# Patient Record
Sex: Female | Born: 1985 | Race: White | Hispanic: No | Marital: Single | Smoking: Current every day smoker
Health system: Southern US, Community
[De-identification: ages and names within clinical notes are randomized; demographics above are authoritative.]

## PROBLEM LIST (undated history)

## (undated) DIAGNOSIS — I8390 Asymptomatic varicose veins of unspecified lower extremity: Secondary | ICD-10-CM

## (undated) DIAGNOSIS — R87629 Unspecified abnormal cytological findings in specimens from vagina: Secondary | ICD-10-CM

## (undated) DIAGNOSIS — IMO0002 Reserved for concepts with insufficient information to code with codable children: Secondary | ICD-10-CM

## (undated) DIAGNOSIS — G43909 Migraine, unspecified, not intractable, without status migrainosus: Secondary | ICD-10-CM

## (undated) DIAGNOSIS — K219 Gastro-esophageal reflux disease without esophagitis: Secondary | ICD-10-CM

## (undated) DIAGNOSIS — R87619 Unspecified abnormal cytological findings in specimens from cervix uteri: Secondary | ICD-10-CM

## (undated) DIAGNOSIS — G8929 Other chronic pain: Secondary | ICD-10-CM

## (undated) DIAGNOSIS — M549 Dorsalgia, unspecified: Secondary | ICD-10-CM

## (undated) DIAGNOSIS — F32A Depression, unspecified: Secondary | ICD-10-CM

## (undated) DIAGNOSIS — F410 Panic disorder [episodic paroxysmal anxiety] without agoraphobia: Secondary | ICD-10-CM

## (undated) DIAGNOSIS — N289 Disorder of kidney and ureter, unspecified: Secondary | ICD-10-CM

## (undated) DIAGNOSIS — L600 Ingrowing nail: Secondary | ICD-10-CM

## (undated) DIAGNOSIS — F329 Major depressive disorder, single episode, unspecified: Secondary | ICD-10-CM

## (undated) DIAGNOSIS — R110 Nausea: Secondary | ICD-10-CM

## (undated) HISTORY — DX: Other chronic pain: G89.29

## (undated) HISTORY — PX: CHOLECYSTECTOMY: SHX55

## (undated) HISTORY — DX: Gastro-esophageal reflux disease without esophagitis: K21.9

## (undated) HISTORY — DX: Panic disorder (episodic paroxysmal anxiety): F41.0

## (undated) HISTORY — DX: Depression, unspecified: F32.A

## (undated) HISTORY — DX: Reserved for concepts with insufficient information to code with codable children: IMO0002

## (undated) HISTORY — DX: Dorsalgia, unspecified: M54.9

## (undated) HISTORY — PX: TONSILLECTOMY: SUR1361

## (undated) HISTORY — DX: Unspecified abnormal cytological findings in specimens from vagina: R87.629

## (undated) HISTORY — DX: Major depressive disorder, single episode, unspecified: F32.9

## (undated) HISTORY — DX: Asymptomatic varicose veins of unspecified lower extremity: I83.90

## (undated) HISTORY — DX: Ingrowing nail: L60.0

## (undated) HISTORY — DX: Unspecified abnormal cytological findings in specimens from cervix uteri: R87.619

---
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
## 2005-02-25 ENCOUNTER — Emergency Department (HOSPITAL_COMMUNITY): Admission: EM | Admit: 2005-02-25 | Discharge: 2005-02-25 | Payer: Self-pay | Admitting: Emergency Medicine

## 2005-02-28 ENCOUNTER — Emergency Department (HOSPITAL_COMMUNITY): Admission: EM | Admit: 2005-02-28 | Discharge: 2005-02-28 | Payer: Self-pay | Admitting: Emergency Medicine

## 2006-02-03 ENCOUNTER — Inpatient Hospital Stay (HOSPITAL_COMMUNITY): Admission: AD | Admit: 2006-02-03 | Discharge: 2006-02-05 | Payer: Self-pay | Admitting: Obstetrics and Gynecology

## 2006-02-03 ENCOUNTER — Inpatient Hospital Stay (HOSPITAL_COMMUNITY): Admission: AD | Admit: 2006-02-03 | Discharge: 2006-02-03 | Payer: Self-pay | Admitting: Obstetrics and Gynecology

## 2006-02-03 ENCOUNTER — Encounter (INDEPENDENT_AMBULATORY_CARE_PROVIDER_SITE_OTHER): Payer: Self-pay | Admitting: Specialist

## 2006-05-23 ENCOUNTER — Ambulatory Visit: Payer: Self-pay | Admitting: Family Medicine

## 2006-05-23 DIAGNOSIS — E669 Obesity, unspecified: Secondary | ICD-10-CM | POA: Insufficient documentation

## 2006-05-23 DIAGNOSIS — M545 Low back pain, unspecified: Secondary | ICD-10-CM | POA: Insufficient documentation

## 2006-05-23 DIAGNOSIS — B957 Other staphylococcus as the cause of diseases classified elsewhere: Secondary | ICD-10-CM | POA: Insufficient documentation

## 2006-05-23 DIAGNOSIS — F419 Anxiety disorder, unspecified: Secondary | ICD-10-CM | POA: Insufficient documentation

## 2006-05-23 DIAGNOSIS — G43909 Migraine, unspecified, not intractable, without status migrainosus: Secondary | ICD-10-CM | POA: Insufficient documentation

## 2006-05-23 DIAGNOSIS — K219 Gastro-esophageal reflux disease without esophagitis: Secondary | ICD-10-CM | POA: Insufficient documentation

## 2006-05-29 ENCOUNTER — Telehealth (INDEPENDENT_AMBULATORY_CARE_PROVIDER_SITE_OTHER): Payer: Self-pay | Admitting: Family Medicine

## 2006-05-29 ENCOUNTER — Encounter (INDEPENDENT_AMBULATORY_CARE_PROVIDER_SITE_OTHER): Payer: Self-pay | Admitting: Family Medicine

## 2006-05-29 LAB — CONVERTED CEMR LAB: TSH: 0.416 microintl units/mL (ref 0.350–5.50)

## 2006-05-30 ENCOUNTER — Telehealth (INDEPENDENT_AMBULATORY_CARE_PROVIDER_SITE_OTHER): Payer: Self-pay | Admitting: Family Medicine

## 2006-06-04 ENCOUNTER — Ambulatory Visit: Payer: Self-pay | Admitting: Family Medicine

## 2006-06-04 LAB — CONVERTED CEMR LAB
Cholesterol, target level: 200 mg/dL
HDL goal, serum: 40 mg/dL
LDL Goal: 160 mg/dL

## 2006-07-15 ENCOUNTER — Telehealth (INDEPENDENT_AMBULATORY_CARE_PROVIDER_SITE_OTHER): Payer: Self-pay | Admitting: Family Medicine

## 2006-07-24 ENCOUNTER — Ambulatory Visit: Payer: Self-pay | Admitting: Family Medicine

## 2006-07-31 ENCOUNTER — Encounter (INDEPENDENT_AMBULATORY_CARE_PROVIDER_SITE_OTHER): Payer: Self-pay | Admitting: Family Medicine

## 2006-08-05 ENCOUNTER — Encounter (INDEPENDENT_AMBULATORY_CARE_PROVIDER_SITE_OTHER): Payer: Self-pay | Admitting: Family Medicine

## 2006-08-06 ENCOUNTER — Ambulatory Visit: Payer: Self-pay | Admitting: Family Medicine

## 2006-08-06 DIAGNOSIS — R82998 Other abnormal findings in urine: Secondary | ICD-10-CM | POA: Insufficient documentation

## 2006-08-06 LAB — CONVERTED CEMR LAB
Bilirubin Urine: NEGATIVE
Glucose, Urine, Semiquant: NEGATIVE
Ketones, urine, test strip: NEGATIVE
Nitrite: NEGATIVE
Protein, U semiquant: 100
Specific Gravity, Urine: 1.02
Urobilinogen, UA: 0.2
pH: 6.5

## 2006-08-08 ENCOUNTER — Encounter (INDEPENDENT_AMBULATORY_CARE_PROVIDER_SITE_OTHER): Payer: Self-pay | Admitting: Family Medicine

## 2006-08-13 ENCOUNTER — Encounter (INDEPENDENT_AMBULATORY_CARE_PROVIDER_SITE_OTHER): Payer: Self-pay | Admitting: Family Medicine

## 2006-08-13 LAB — CONVERTED CEMR LAB
Rubella: 34.3 intl units/mL — ABNORMAL HIGH
Varicella IgG: 2.65 — ABNORMAL HIGH

## 2006-09-10 ENCOUNTER — Ambulatory Visit: Payer: Self-pay | Admitting: Internal Medicine

## 2006-09-18 ENCOUNTER — Ambulatory Visit: Payer: Self-pay | Admitting: Family Medicine

## 2006-12-08 ENCOUNTER — Emergency Department (HOSPITAL_COMMUNITY): Admission: EM | Admit: 2006-12-08 | Discharge: 2006-12-08 | Payer: Self-pay | Admitting: Emergency Medicine

## 2007-01-30 ENCOUNTER — Emergency Department (HOSPITAL_COMMUNITY): Admission: EM | Admit: 2007-01-30 | Discharge: 2007-01-31 | Payer: Self-pay | Admitting: Emergency Medicine

## 2007-02-06 ENCOUNTER — Ambulatory Visit (HOSPITAL_COMMUNITY): Admission: RE | Admit: 2007-02-06 | Discharge: 2007-02-06 | Payer: Self-pay | Admitting: Orthopaedic Surgery

## 2007-02-15 ENCOUNTER — Emergency Department (HOSPITAL_COMMUNITY): Admission: EM | Admit: 2007-02-15 | Discharge: 2007-02-15 | Payer: Self-pay | Admitting: Emergency Medicine

## 2007-05-07 ENCOUNTER — Emergency Department (HOSPITAL_COMMUNITY): Admission: EM | Admit: 2007-05-07 | Discharge: 2007-05-07 | Payer: Self-pay | Admitting: Emergency Medicine

## 2007-05-09 ENCOUNTER — Emergency Department (HOSPITAL_COMMUNITY): Admission: EM | Admit: 2007-05-09 | Discharge: 2007-05-09 | Payer: Self-pay | Admitting: Emergency Medicine

## 2007-06-04 ENCOUNTER — Emergency Department (HOSPITAL_COMMUNITY): Admission: EM | Admit: 2007-06-04 | Discharge: 2007-06-04 | Payer: Self-pay | Admitting: Emergency Medicine

## 2008-07-03 ENCOUNTER — Emergency Department (HOSPITAL_COMMUNITY): Admission: EM | Admit: 2008-07-03 | Discharge: 2008-07-04 | Payer: Self-pay | Admitting: Emergency Medicine

## 2009-02-10 ENCOUNTER — Emergency Department (HOSPITAL_COMMUNITY): Admission: EM | Admit: 2009-02-10 | Discharge: 2009-02-10 | Payer: Self-pay | Admitting: Emergency Medicine

## 2010-01-31 ENCOUNTER — Emergency Department (HOSPITAL_COMMUNITY)
Admission: EM | Admit: 2010-01-31 | Discharge: 2010-01-31 | Payer: Self-pay | Source: Home / Self Care | Admitting: Emergency Medicine

## 2010-04-15 LAB — URINALYSIS, ROUTINE W REFLEX MICROSCOPIC
Glucose, UA: NEGATIVE mg/dL
Hgb urine dipstick: NEGATIVE
Protein, ur: NEGATIVE mg/dL

## 2010-04-15 LAB — PREGNANCY, URINE: Preg Test, Ur: NEGATIVE

## 2010-04-15 LAB — URINE MICROSCOPIC-ADD ON

## 2010-05-07 LAB — URINALYSIS, ROUTINE W REFLEX MICROSCOPIC
Ketones, ur: NEGATIVE mg/dL
Leukocytes, UA: NEGATIVE
Nitrite: NEGATIVE
Protein, ur: 30 mg/dL — AB
pH: 5.5 (ref 5.0–8.0)

## 2010-05-07 LAB — PREGNANCY, URINE: Preg Test, Ur: NEGATIVE

## 2010-05-07 LAB — URINE MICROSCOPIC-ADD ON

## 2010-08-20 ENCOUNTER — Encounter (INDEPENDENT_AMBULATORY_CARE_PROVIDER_SITE_OTHER): Payer: Self-pay | Admitting: *Deleted

## 2010-09-04 ENCOUNTER — Encounter (INDEPENDENT_AMBULATORY_CARE_PROVIDER_SITE_OTHER): Payer: Self-pay

## 2010-10-04 ENCOUNTER — Ambulatory Visit (INDEPENDENT_AMBULATORY_CARE_PROVIDER_SITE_OTHER): Payer: Self-pay | Admitting: Internal Medicine

## 2010-10-23 LAB — CULTURE, ROUTINE-ABSCESS

## 2010-11-06 LAB — URINE CULTURE: Colony Count: 6000

## 2010-11-06 LAB — URINALYSIS, ROUTINE W REFLEX MICROSCOPIC
Bilirubin Urine: NEGATIVE
Ketones, ur: NEGATIVE
Nitrite: NEGATIVE
Urobilinogen, UA: 0.2

## 2010-11-06 LAB — PREGNANCY, URINE: Preg Test, Ur: NEGATIVE

## 2011-03-13 ENCOUNTER — Emergency Department (HOSPITAL_COMMUNITY)
Admission: EM | Admit: 2011-03-13 | Discharge: 2011-03-13 | Disposition: A | Discharge status: Home / Self Care | Payer: Medicaid Other | Attending: Emergency Medicine | Admitting: Emergency Medicine

## 2011-03-13 ENCOUNTER — Encounter (HOSPITAL_COMMUNITY): Payer: Self-pay | Admitting: *Deleted

## 2011-03-13 DIAGNOSIS — K219 Gastro-esophageal reflux disease without esophagitis: Secondary | ICD-10-CM | POA: Insufficient documentation

## 2011-03-13 DIAGNOSIS — R05 Cough: Secondary | ICD-10-CM | POA: Insufficient documentation

## 2011-03-13 DIAGNOSIS — M542 Cervicalgia: Secondary | ICD-10-CM | POA: Insufficient documentation

## 2011-03-13 DIAGNOSIS — F17200 Nicotine dependence, unspecified, uncomplicated: Secondary | ICD-10-CM | POA: Insufficient documentation

## 2011-03-13 DIAGNOSIS — R6883 Chills (without fever): Secondary | ICD-10-CM | POA: Insufficient documentation

## 2011-03-13 DIAGNOSIS — J029 Acute pharyngitis, unspecified: Secondary | ICD-10-CM

## 2011-03-13 DIAGNOSIS — R0982 Postnasal drip: Secondary | ICD-10-CM | POA: Insufficient documentation

## 2011-03-13 DIAGNOSIS — M545 Low back pain, unspecified: Secondary | ICD-10-CM | POA: Insufficient documentation

## 2011-03-13 DIAGNOSIS — J3489 Other specified disorders of nose and nasal sinuses: Secondary | ICD-10-CM | POA: Insufficient documentation

## 2011-03-13 DIAGNOSIS — R220 Localized swelling, mass and lump, head: Secondary | ICD-10-CM | POA: Insufficient documentation

## 2011-03-13 DIAGNOSIS — R059 Cough, unspecified: Secondary | ICD-10-CM | POA: Insufficient documentation

## 2011-03-13 DIAGNOSIS — J329 Chronic sinusitis, unspecified: Secondary | ICD-10-CM

## 2011-03-13 DIAGNOSIS — IMO0001 Reserved for inherently not codable concepts without codable children: Secondary | ICD-10-CM | POA: Insufficient documentation

## 2011-03-13 DIAGNOSIS — Z79899 Other long term (current) drug therapy: Secondary | ICD-10-CM | POA: Insufficient documentation

## 2011-03-13 MED ORDER — KETOROLAC TROMETHAMINE 60 MG/2ML IM SOLN
60.0000 mg | Freq: Once | INTRAMUSCULAR | Status: AC
  Administered 2011-03-13: 60 mg via INTRAMUSCULAR
  Filled 2011-03-13: qty 2

## 2011-03-13 MED ORDER — AZITHROMYCIN 250 MG PO TABS: 250.0000 mg | ORAL_TABLET | Freq: Every day | ORAL | Status: AC

## 2011-03-13 MED ORDER — AZITHROMYCIN 250 MG PO TABS
500.0000 mg | ORAL_TABLET | Freq: Once | ORAL | Status: AC
  Administered 2011-03-13: 500 mg via ORAL
  Filled 2011-03-13: qty 2

## 2011-03-13 NOTE — ED Notes (Signed)
C/o sore throat x 1 week; c/o non-productive cough x 2 days; c/o pain to lower back and upper abd-states thinks "pulled something" d/t coughing

## 2011-03-13 NOTE — ED Provider Notes (Signed)
History     CSN: [id number]  Arrival date & time 03/13/11  0135   First MD Initiated Contact with Patient 03/13/11 [phone]      Chief Complaint  Patient presents with  . Sore Throat  . Cough    (Consider location/radiation/quality/duration/timing/severity/associated sxs/prior treatment) HPI Comments: Patient reports that she's had some sinus drainage as well as a severe sore throat that has been getting worse over the past week. She is now begun coughing as well which is mostly a dry cough. Patient has not been able to smoke because of her symptoms over the last week. She denies any nausea vomiting or diarrhea. She's not sure about fevers but she does endorse some chills and feeling somewhat achy. She has been using over-the-counter medications such as Mucinex, cough drops without much relief. She does have a problem with chronic back pain and she thinks that she has worsened her back pain due to all the coughing. She normally takes 10 mg Vicodin and she has had to use more than usual so she has used up her usual allotment but she does have refills available. She reports she simply has to go fill it.she denies acute headache. She does have a history of migraines and does not have any pain in her head worsened usual. She reports that she has pain in her throat and her anterior neck but not posteriorly. She has no difficulty moving or turning her head. She denies a rash.  Patient is a 26 y.o. female presenting with pharyngitis and cough. The history is provided by the patient and a parent.  Sore Throat Pertinent negatives include no headaches.  Cough Associated symptoms include chills, rhinorrhea and sore throat. Pertinent negatives include no ear pain, no headaches and no eye redness.    Past Medical History  Diagnosis Date  . Panic disorder without agoraphobia   . Esophageal reflux   . Asymptomatic varicose veins   . Ingrowing nail     Past Surgical History  Procedure Date  .  Tonsillectomy     History reviewed. No pertinent family history.  History  Substance Use Topics  . Smoking status: Current Everyday Smoker    Types: Cigarettes  . Smokeless tobacco: Not on file  . Alcohol Use: Yes     occasionally    OB History    Grav Para Term Preterm Abortions TAB SAB Ect Mult Living                  Review of Systems  Constitutional: Positive for chills.  HENT: Positive for congestion, sore throat, rhinorrhea and postnasal drip. Negative for ear pain and voice change.   Eyes: Negative for redness.  Respiratory: Positive for cough.   Gastrointestinal: Negative for nausea, vomiting and diarrhea.  Skin: Negative for rash.  Neurological: Negative for headaches.    Allergies  Review of patient's allergies indicates no known allergies.  Home Medications   Current Outpatient Rx  Name Route Sig Dispense Refill  . ESOMEPRAZOLE MAGNESIUM 40 MG PO CPDR Oral Take 40 mg by mouth daily before breakfast.    . ALPRAZOLAM 0.25 MG PO TABS Oral Take by mouth at bedtime as needed.      . AZITHROMYCIN 250 MG PO TABS Oral Take 1 tablet (250 mg total) by mouth daily. 4 tablet 0  . CLINDAMYCIN PHOSPHATE 1 % EX GEL Topical Apply topically 2 (two) times daily.      Marland Kitchen DOXYCYCLINE (ROSACEA) 40 MG PO CPDR Oral Take by  mouth every morning.      Marland Kitchen ESCITALOPRAM OXALATE 10 MG PO TABS Oral Take by mouth daily.      Marland Kitchen HYDROCODONE-ACETAMINOPHEN 10-325 MG PO TABS Oral Take by mouth.      . OMEPRAZOLE 10 MG PO CPDR Oral Take by mouth daily.        BP 115/62  Pulse 74  Temp(Src) 97.9 F (36.6 C) (Oral)  Resp 20  Ht 5\' 4"  (1.626 m)  Wt 220 lb (99.791 kg)  BMI 37.76 kg/m2  SpO2 100%  LMP 02/19/2011  Physical Exam  Vitals reviewed. Constitutional: She appears well-developed and well-nourished. No distress.  HENT:  Head: Normocephalic and atraumatic.  Nose: Mucosal edema and rhinorrhea present. Right sinus exhibits maxillary sinus tenderness. Left sinus exhibits maxillary  sinus tenderness.  Mouth/Throat: Uvula is midline, oropharynx is clear and moist and mucous membranes are normal.  Eyes: Pupils are equal, round, and reactive to light. Right eye exhibits no discharge.  Neck: Normal range of motion and full passive range of motion without pain. Neck supple. No spinous process tenderness present. No tracheal deviation and normal range of motion present. No thyromegaly present.  Pulmonary/Chest: Effort normal and breath sounds normal. No stridor. No respiratory distress. She has no wheezes. She has no rales.  Abdominal: Soft. She exhibits no distension. There is no tenderness.  Musculoskeletal:       Minimal tenderness to diffuse low back without spasms, deformity or spinous process tenderenss  Lymphadenopathy:    She has no cervical adenopathy.  Neurological: She is alert.    ED Course  Procedures (including critical care time)  Labs Reviewed - No data to display No results found.   1. Sinusitis   2. Pharyngitis     RA sat is 100% and normal.    MDM  Pt with likely URI and sinus infection.  Has been present for 2 weeks in her estimation although she initially stated about 1 week.  Will give antibiotic trial for sinusitis since has facial tenderness and symptoms present for 2 weeks.  I think sore throat and coughing is from poast nasal drip related to sinus drainage.  Pt is encouraged to stop smoking.        Gavin Pound. Oletta Lamas, MD 03/13/11 [id number]

## 2011-03-13 NOTE — Discharge Instructions (Signed)
Sinusitis Sinuses are air pockets within the bones of your face. The growth of bacteria within a sinus leads to infection. The infection prevents the sinuses from draining. This infection is called sinusitis. SYMPTOMS  There will be different areas of pain depending on which sinuses have become infected.  The maxillary sinuses often produce pain beneath the eyes.   Frontal sinusitis may cause pain in the middle of the forehead and above the eyes.  Other problems (symptoms) include:  Toothaches.   Colored, pus-like (purulent) drainage from the nose.   Swelling, warmth, and tenderness over the sinus areas may be signs of infection.  TREATMENT  Sinusitis is most often determined by an exam.X-rays may be taken. If x-rays have been taken, make sure you obtain your results or find out how you are to obtain them. Your caregiver may give you medications (antibiotics). These are medications that will help kill the bacteria causing the infection. You may also be given a medication (decongestant) that helps to reduce sinus swelling.  HOME CARE INSTRUCTIONS   Only take over-the-counter or prescription medicines for pain, discomfort, or fever as directed by your caregiver.   Drink extra fluids. Fluids help thin the mucus so your sinuses can drain more easily.   Applying either moist heat or ice packs to the sinus areas may help relieve discomfort.   Use saline nasal sprays to help moisten your sinuses. The sprays can be found at your local drugstore.  SEEK IMMEDIATE MEDICAL CARE IF:  You have a fever.   You have increasing pain, severe headaches, or toothache.   You have nausea, vomiting, or drowsiness.   You develop unusual swelling around the face or trouble seeing.  MAKE SURE YOU:   Understand these instructions.   Will watch your condition.   Will get help right away if you are not doing well or get worse.  Document Released: 01/14/2005 Document Revised: 09/26/2010 Document Reviewed:  08/13/2006 [employer] Patient Information 2012 [employer]. 

## 2011-05-28 ENCOUNTER — Ambulatory Visit (INDEPENDENT_AMBULATORY_CARE_PROVIDER_SITE_OTHER): Payer: Medicaid Other | Admitting: Urgent Care

## 2011-05-28 ENCOUNTER — Encounter: Payer: Self-pay | Admitting: Urgent Care

## 2011-05-28 VITALS — BP 126/79 | HR 63 | Temp 97.7°F | Ht 64.0 in | Wt 258.4 lb

## 2011-05-28 DIAGNOSIS — R110 Nausea: Secondary | ICD-10-CM | POA: Insufficient documentation

## 2011-05-28 DIAGNOSIS — K219 Gastro-esophageal reflux disease without esophagitis: Secondary | ICD-10-CM

## 2011-05-28 NOTE — Assessment & Plan Note (Signed)
Haley Brady is a pleasant 26 y.o. female with long-standing history of chronic GERD and morning nausea. Now with refractory GERD symptoms despite multiple trials of different PPIs. Differentials include poorly controlled GERD, gastritis, esophagitis, gastroparesis and non-acid reflux.  EGD for further evaluation with Dr. Darrick Penna.  I have discussed risks & benefits which include, but are not limited to, bleeding, infection, perforation & drug reaction.  The patient agrees with this plan & written consent will be obtained.    Continue Nexium 40 mg daily GERD diet Weight loss Quit smoking Urine pregnancy

## 2011-05-28 NOTE — Progress Notes (Signed)
Referring Provider: Dwana Melena, MD Primary Care Physician:  Dwana Melena, MD, MD Primary Gastroenterologist:  Dr. Darrick Penna  Chief Complaint  Patient presents with  . Gastrophageal Reflux    Refractory  . Nausea   HPI:  Haley Brady is a 26 y.o. female here as a referral from Dr. Margo Aye for refractory GERD. She tells me she wakes up w/ heartburn despite taking nexium 40mg  daily.  She gives history of GERD since her teens in high school w/ nausea & vomiting each morning.  Her symptoms have become worse over the years. She has chest pain, heartburn and indigestion. She has tried prilosec, aciphex, protonix, dexilant, omeprazole, & prevacid without relief of her symptoms.  Denies any dysphagia or odynophagia.  Wt stable.  Denies anorexia.  She tells me she has been eating healthy, but does admit to late-night meals. She is concerned because her father had pancreatic cancer.  She denies any abdominal pain. Denies any lower GI symptoms including constipation, diarrhea, rectal bleeding, melena or weight loss.  Past Medical History  Diagnosis Date  . Panic disorder without agoraphobia   . Esophageal reflux   . Asymptomatic varicose veins   . Ingrowing nail   . Depression   . Chronic back pain   . Degenerative disc disease   . Abnormal pap     Past Surgical History  Procedure Date  . Tonsillectomy     Current Outpatient Prescriptions  Medication Sig Dispense Refill  . ALPRAZolam (XANAX) 0.25 MG tablet Take by mouth at bedtime as needed.        . clindamycin (CLINDAGEL) 1 % gel Apply topically 2 (two) times daily.        Marland Kitchen doxycycline (ORACEA) 40 MG capsule Take by mouth every morning.        . escitalopram (LEXAPRO) 10 MG tablet Take by mouth daily.        Marland Kitchen esomeprazole (NEXIUM) 40 MG capsule Take 40 mg by mouth daily before breakfast.      . HYDROcodone-acetaminophen (NORCO) 10-325 MG per tablet Take by mouth.          Allergies as of 05/28/2011  . (No Known Allergies)    Family  History  Problem Relation Age of Onset  . Pancreatic cancer Father 84    History   Social History  . Marital Status: Single    Spouse Name: N/A    Number of Children: 1  . Years of Education: N/A   Occupational History  . waitress    Social History Main Topics  . Smoking status: Current Some Day Smoker    Types: Cigarettes  . Smokeless tobacco: Not on file  . Alcohol Use: Yes     occasionally  . Drug Use: No  . Sexually Active: Not on file   Other Topics Concern  . Not on file   Social History Narrative   Lives w/ daughter-5   Review of Systems: Gen: Denies any fever, chills, sweats, anorexia, fatigue, weakness, malaise, weight loss, and sleep disorder CV: Denies chest pain, angina, palpitations, syncope, orthopnea, PND, peripheral edema, and claudication. Resp: Denies dyspnea at rest, dyspnea with exercise, cough, sputum, wheezing, coughing up blood, and pleurisy. GI: Denies vomiting blood, jaundice, and fecal incontinence.  GU : Denies urinary burning, blood in urine, urinary frequency, urinary hesitancy, nocturnal urination, and urinary incontinence. MS: Denies joint pain, limitation of movement, and swelling, stiffness, low back pain, extremity pain. Denies muscle weakness, cramps, atrophy.  Derm: Denies rash, itching, dry skin,  hives, moles, warts, or unhealing ulcers.  Psych: Denies depression, anxiety, memory loss, suicidal ideation, hallucinations, paranoia, and confusion. Heme: Denies bruising, bleeding, and enlarged lymph nodes. Neuro:  Denies any headaches, dizziness, paresthesias. Endo:  Denies any problems with DM, thyroid, adrenal function.  Physical Exam: BP 126/79  Pulse 63  Temp(Src) 97.7 F (36.5 C) (Temporal)  Ht 5\' 4"  (1.626 m)  Wt 258 lb 6.4 oz (117.209 kg)  BMI 44.35 kg/m2  LMP 05/21/2011 General:   Alert,  Well-developed, well-nourished, pleasant and cooperative in NAD.  Accompanied by her daughter today. Head:  Normocephalic and  atraumatic. Eyes:  Sclera clear, no icterus.   Conjunctiva pink. Ears:  Normal auditory acuity. Nose:  No deformity, discharge, or lesions. Mouth:  No deformity or lesions,oropharynx pink & moist. Neck:  Supple; no masses or thyromegaly. Lungs:  Clear throughout to auscultation.   No wheezes, crackles, or rhonchi. No acute distress. Heart:  Regular rate and rhythm; no murmurs, clicks, rubs,  or gallops. Abdomen:  Obese, Normal bowel sounds.  No bruits.  Soft, non-tender and non-distended without masses, hepatosplenomegaly or hernias noted.  No guarding or rebound tenderness.   Rectal:  Deferred. Msk:  Symmetrical without gross deformities. Normal posture. Pulses:  Normal pulses noted. Extremities:  No clubbing or edema. Neurologic:  Alert and oriented x4;  grossly normal neurologically. Skin:  Intact without significant lesions or rashes. Lymph Nodes:  No significant cervical adenopathy. Psych:  Alert and cooperative. Normal mood and affect.

## 2011-05-28 NOTE — Patient Instructions (Signed)
Continue Nexium daily Go get a urine pregnancy test You will need an upper endoscopy (EGD) with Dr. Darrick Penna  Diet for GERD or PUD Nutrition therapy can help ease the discomfort of gastroesophageal reflux disease (GERD) and peptic ulcer disease (PUD).  HOME CARE INSTRUCTIONS   Eat your meals slowly, in a relaxed setting.   Eat 5 to 6 small meals per day.   If a food causes distress, stop eating it for a period of time.  FOODS TO AVOID  Coffee, regular or decaffeinated.   Cola beverages, regular or low calorie.   Tea, regular or decaffeinated.   Pepper.   Cocoa.   High fat foods, including meats.   Butter, margarine, hydrogenated oil (trans fats).   Peppermint or spearmint (if you have GERD).   Fruits and vegetables if not tolerated.   Alcohol.   Nicotine (smoking or chewing). This is one of the most potent stimulants to acid production in the gastrointestinal tract.   Any food that seems to aggravate your condition.  If you have questions regarding your diet, ask your caregiver or a registered dietitian. TIPS  Lying flat may make symptoms worse. Keep the head of your bed raised 6 to 9 inches (15 to 23 cm) by using a foam wedge or blocks under the legs of the bed.   Do not lay down until 3 hours after eating a meal.   Daily physical activity may help reduce symptoms.  MAKE SURE YOU:   Understand these instructions.   Will watch your condition.   Will get help right away if you are not doing well or get worse.  Document Released: 01/14/2005 Document Revised: 01/03/2011 Document Reviewed: 11/30/2010 [facility] Patient Information 2012 [place], [place].

## 2011-05-28 NOTE — Progress Notes (Signed)
Faxed to PCP

## 2011-05-28 NOTE — Assessment & Plan Note (Signed)
Symptoms suspicious for gastroparesis. Chronic narcotic use may be contributing factor. EGD pending. Consider GES pending EGD findings.

## 2011-05-29 HISTORY — PX: BRAVO PH STUDY: SHX5421

## 2011-05-29 LAB — PREGNANCY, URINE: Preg Test, Ur: NEGATIVE

## 2011-05-29 NOTE — Progress Notes (Signed)
Quick Note:  LMOM for pt to call. ______ 

## 2011-05-29 NOTE — Progress Notes (Signed)
Quick Note:  Pt returned call and was informed. ______ 

## 2011-05-29 NOTE — Progress Notes (Signed)
Quick Note:  Please let patient know she is not pregnant. CC: HALL,ZACK, MD  ______

## 2011-06-04 MED ORDER — ONDANSETRON HCL 4 MG/2ML IJ SOLN
INTRAMUSCULAR | Status: AC
  Filled 2011-06-04: qty 2

## 2011-06-04 MED ORDER — MIDAZOLAM HCL 2 MG/2ML IJ SOLN
INTRAMUSCULAR | Status: AC
  Filled 2011-06-04: qty 2

## 2011-06-04 MED ORDER — LABETALOL HCL 5 MG/ML IV SOLN
INTRAVENOUS | Status: AC
  Filled 2011-06-04: qty 4

## 2011-06-04 MED ORDER — GLYCOPYRROLATE 0.2 MG/ML IJ SOLN
INTRAMUSCULAR | Status: AC
  Filled 2011-06-04: qty 1

## 2011-06-04 MED ORDER — DEXAMETHASONE SODIUM PHOSPHATE 4 MG/ML IJ SOLN
INTRAMUSCULAR | Status: AC
  Filled 2011-06-04: qty 1

## 2011-06-06 NOTE — Progress Notes (Signed)
REVIEWED.  

## 2011-06-10 ENCOUNTER — Encounter (HOSPITAL_COMMUNITY): Payer: Self-pay | Admitting: Pharmacy Technician

## 2011-06-10 MED ORDER — SODIUM CHLORIDE 0.45 % IV SOLN
Freq: Once | INTRAVENOUS | Status: AC
  Administered 2011-06-11: 13:00:00 via INTRAVENOUS

## 2011-06-11 ENCOUNTER — Encounter (HOSPITAL_COMMUNITY): Payer: Self-pay | Admitting: *Deleted

## 2011-06-11 ENCOUNTER — Encounter (HOSPITAL_COMMUNITY)
Admission: RE | Disposition: A | Discharge status: Home / Self Care | Payer: Self-pay | Source: Ambulatory Visit | Attending: Gastroenterology

## 2011-06-11 ENCOUNTER — Ambulatory Visit (HOSPITAL_COMMUNITY)
Admission: RE | Admit: 2011-06-11 | Discharge: 2011-06-11 | Disposition: A | Discharge status: Home / Self Care | Payer: Medicaid Other | Source: Ambulatory Visit | Attending: Gastroenterology | Admitting: Gastroenterology

## 2011-06-11 DIAGNOSIS — D131 Benign neoplasm of stomach: Secondary | ICD-10-CM

## 2011-06-11 DIAGNOSIS — R110 Nausea: Secondary | ICD-10-CM

## 2011-06-11 DIAGNOSIS — K2990 Gastroduodenitis, unspecified, without bleeding: Secondary | ICD-10-CM

## 2011-06-11 DIAGNOSIS — K2940 Chronic atrophic gastritis without bleeding: Secondary | ICD-10-CM | POA: Insufficient documentation

## 2011-06-11 DIAGNOSIS — Z6841 Body Mass Index (BMI) 40.0 and over, adult: Secondary | ICD-10-CM | POA: Insufficient documentation

## 2011-06-11 DIAGNOSIS — R112 Nausea with vomiting, unspecified: Secondary | ICD-10-CM

## 2011-06-11 DIAGNOSIS — K219 Gastro-esophageal reflux disease without esophagitis: Secondary | ICD-10-CM

## 2011-06-11 DIAGNOSIS — R1013 Epigastric pain: Secondary | ICD-10-CM | POA: Insufficient documentation

## 2011-06-11 DIAGNOSIS — K319 Disease of stomach and duodenum, unspecified: Secondary | ICD-10-CM | POA: Insufficient documentation

## 2011-06-11 DIAGNOSIS — K2970 Gastritis, unspecified, without bleeding: Secondary | ICD-10-CM

## 2011-06-11 DIAGNOSIS — R079 Chest pain, unspecified: Secondary | ICD-10-CM

## 2011-06-11 HISTORY — PX: ESOPHAGOGASTRODUODENOSCOPY: SHX5428

## 2011-06-11 SURGERY — EGD (ESOPHAGOGASTRODUODENOSCOPY)
Anesthesia: Moderate Sedation

## 2011-06-11 MED ORDER — PROMETHAZINE HCL 25 MG/ML IJ SOLN
INTRAMUSCULAR | Status: AC
  Filled 2011-06-11: qty 1

## 2011-06-11 MED ORDER — PROMETHAZINE HCL 25 MG/ML IJ SOLN
INTRAMUSCULAR | Status: DC | PRN
  Administered 2011-06-11 (×2): 12.5 mg via INTRAVENOUS

## 2011-06-11 MED ORDER — MIDAZOLAM HCL 5 MG/5ML IJ SOLN
INTRAMUSCULAR | Status: DC | PRN
  Administered 2011-06-11 (×5): 2 mg via INTRAVENOUS

## 2011-06-11 MED ORDER — BUTAMBEN-TETRACAINE-BENZOCAINE 2-2-14 % EX AERO
INHALATION_SPRAY | CUTANEOUS | Status: DC | PRN
  Administered 2011-06-11: 2 via TOPICAL

## 2011-06-11 MED ORDER — MEPERIDINE HCL 100 MG/ML IJ SOLN
INTRAMUSCULAR | Status: AC
  Filled 2011-06-11: qty 2

## 2011-06-11 MED ORDER — MEPERIDINE HCL 100 MG/ML IJ SOLN
INTRAMUSCULAR | Status: DC | PRN
Start: 2011-06-11 — End: 2011-06-11
  Administered 2011-06-11 (×3): 25 mg via INTRAVENOUS
  Administered 2011-06-11 (×2): 50 mg via INTRAVENOUS
  Administered 2011-06-11: 25 mg via INTRAVENOUS

## 2011-06-11 MED ORDER — SODIUM CHLORIDE 0.9 % IJ SOLN
INTRAMUSCULAR | Status: AC
  Filled 2011-06-11: qty 10

## 2011-06-11 MED ORDER — STERILE WATER FOR IRRIGATION IR SOLN
Status: DC | PRN
  Administered 2011-06-11: 13:00:00

## 2011-06-11 MED ORDER — MIDAZOLAM HCL 5 MG/5ML IJ SOLN
INTRAMUSCULAR | Status: AC
  Filled 2011-06-11: qty 10

## 2011-06-11 NOTE — H&P (Addendum)
  Primary Care Physician:  Dwana Melena, MD, MD Primary Gastroenterologist:  Dr. Darrick Penna  Pre-Procedure History & Physical: HPI:  Haley Brady is a 26 y.o. female here for BURNING CHEST PAIN, WATER BRASH, FREQUENT NAUSEA, AND RARE VOMITING-BMI 44. LMP APR 23  Past Medical History  Diagnosis Date  . Panic disorder without agoraphobia   . Esophageal reflux   . Asymptomatic varicose veins   . Ingrowing nail   . Depression   . Chronic back pain   . Degenerative disc disease   . Abnormal pap     Past Surgical History  Procedure Date  . Tonsillectomy     Prior to Admission medications   Medication Sig Start Date End Date Taking? Authorizing Provider  ALPRAZolam (XANAX) 0.25 MG tablet Take 0.25 mg by mouth at bedtime as needed. Anxiety   Yes Historical Provider, MD  escitalopram (LEXAPRO) 20 MG tablet Take 20 mg by mouth daily.   Yes Historical Provider, MD  esomeprazole (NEXIUM) 40 MG capsule Take 40 mg by mouth daily before breakfast.   Yes Historical Provider, MD  HYDROcodone-acetaminophen (NORCO) 10-325 MG per tablet Take 1 tablet by mouth every 6 (six) hours as needed. Pain   Yes Historical Provider, MD  ibuprofen (ADVIL,MOTRIN) 200 MG tablet Take 600 mg by mouth every 6 (six) hours as needed. Pain   Yes Historical Provider, MD  norgestimate-ethinyl estradiol (ORTHO-CYCLEN,SPRINTEC,PREVIFEM) 0.25-35 MG-MCG tablet Take 1 tablet by mouth daily.   Yes Historical Provider, MD  clindamycin (CLINDAGEL) 1 % gel Apply 1 application topically 2 (two) times daily as needed. Affected Area    Historical Provider, MD    Allergies as of 05/28/2011  . (No Known Allergies)    Family History  Problem Relation Age of Onset  . Pancreatic cancer Father 42    History   Social History  . Marital Status: Single    Spouse Name: N/A    Number of Children: 1  . Years of Education: N/A   Occupational History  . waitress    Social History Main Topics  . Smoking status: Current Some Day  Smoker    Types: Cigarettes  . Smokeless tobacco: Not on file  . Alcohol Use: Yes     occasionally  . Drug Use: No  . Sexually Active: Not on file   Other Topics Concern  . Not on file   Social History Narrative   Lives w/ daughter-5    Review of Systems: See HPI, otherwise negative ROS   Physical Exam: BP 134/84  Pulse 66  Temp(Src) 98 F (36.7 C) (Oral)  Resp 18  SpO2 100%  LMP 05/21/2011 General:   Alert,  pleasant and cooperative in NAD Head:  Normocephalic and atraumatic. Neck:  Supple;  Lungs:  Clear throughout to auscultation.    Heart:  Regular rate and rhythm. Abdomen:  Soft, nontender and nondistended. Normal bowel sounds, without guarding, and without rebound.   Neurologic:  Alert and  oriented x4;  grossly normal neurologically.  Impression/Plan:    FREQUENT NAUSEA, RARE VOMITING, UNCONTROLLED BURNING CHEST PAIN  PLAN:  1. EGD/?BRAVO

## 2011-06-11 NOTE — Op Note (Addendum)
[facility] [address] [place], [place]  [address]  ENDOSCOPY PROCEDURE REPORT  PATIENT:  Haley Brady, Haley Brady  MR#:  [id number] BIRTHDATE:  1985/09/05, 25 yrs. old  GENDER:  female  ENDOSCOPIST:  Jonette Eva, MD Referred by:  Catalina Pizza, M.D.  PROCEDURE DATE:  06/11/2011 PROCEDURE:  EGD with biopsy, 43239 ASA CLASS: INDICATIONS:  BURNING CHEST PAIN, FREQUENT NAUSEA, RARE VOMITING, BML 44  MEDICATIONS:   Demerol 200 mg IV, Versed 10 mg IV, promethazine (Phenergan) 25 mg IV TOPICAL ANESTHETIC:  Cetacaine Spray  DESCRIPTION OF PROCEDURE:     Physical exam was performed. Informed consent was obtained from the patient after explaining the benefits, risks, and alternatives to the procedure.  The patient was connected to the monitor and placed in the left lateral position.  Continuous oxygen was provided by nasal cannula and IV medicine administered through an indwelling cannula.  After administration of sedation, the patient's esophagus was intubated and the [id number] ([id number]) endoscope was advanced under direct visualization to the second portion of the duodenum.  The scope was removed slowly by carefully examining the color, texture, anatomy, and integrity of the mucosa on the way out.  The patient was recovered in endoscopy and discharged home in satisfactory condition. <<PROCEDUREIMAGES>>  Nl esophagus. GE JXN AT 40 CM FROM THE TEETH. BRAVO CAPSULE PLACED 34 CM FROM THE TEETH. NO BARETT''S. 1-2CM HIATAL HERNIA. Moderate gastritis & ONE 3 MM SESSILE GASTRIC POLYP was found REMOVED VIA COLD FORCEPS. NL LEUM.  COMPLICATIONS:    None  ENDOSCOPIC IMPRESSION: 1) Nl esophagus 2) Moderate gastritis 3) SMALL HIATAL HERNIA 4) BURNING CHESTPAIN: GERD, NERD, OR NON-ULCER DYSPEPSIA  RECOMMENDATIONS: NEXIUM QD LOW FAT DIET AWAUT BIOPSIES/BRAVO RESULTS OPV IN 4 MOS  ADDENDUM 52841-32 BRAVO STUDY  ON NEXIUM 40 MG DAILY TAKEN AFTER PT LFT ENDOSCOPY. 217 EPISODE OF  REFLUX, DEMEESTER SCORE DAY 1: 51.9, DEMEESTER SCORE DAY 2: 24.8.  PLAN: 1. ADD BACOLFEN QAC TID 2. INCREASE NEXIUM TO BID 3. LOSE WEIGHT 4. LOW FAT DIET 5. OPV IN 4 MOS.  REPEAT EXAM:  No  ______________________________ Jonette Eva, MD  CC:  n. REVISED:  06/21/2011 01:30 PM eSIGNED:   Royalti Schauf at 06/21/2011 01:30 PM  Boneta Lucks, [phone]

## 2011-06-11 NOTE — Discharge Instructions (Signed)
YOUR ESOPHAGUS IS NORMAL. I PLACED THE CAPSULE IN YOUR ESOPHAGUS. You have gastritis DUE TO IBUPROFEN. I biopsied your stomach.    FOLLOW BRAVO STUDY FOR NEXT 48 HOURS. No MRI for 30 days.  TAKE NEXIUM 30 MINUTES PRIOR TO YOUR MEALS DAILY FOREVER. AVOID TRIGGERS FOR GASTRITIS. SEE INFO BELOW.  FOLLOW A LOW FAT DIET. SEE INFO BELOW. LOSE 10-20 LBS.  FOLLOW UP IN 4 MOS.  UPPER ENDOSCOPY AFTER CARE Read the instructions outlined below and refer to this sheet in the next week. These discharge instructions provide you with general information on caring for yourself after you leave the hospital. While your treatment has been planned according to the most current medical practices available, unavoidable complications occasionally occur. If you have any problems or questions after discharge, call DR. Helaman Mecca, [phone].  ACTIVITY  You may resume your regular activity, but move at a slower pace for the next 24 hours.   Take frequent rest periods for the next 24 hours.   Walking will help get rid of the air and reduce the bloated feeling in your belly (abdomen).   No driving for 24 hours (because of the medicine (anesthesia) used during the test).   You may shower.   Do not sign any important legal documents or operate any machinery for 24 hours (because of the anesthesia used during the test).    NUTRITION  Drink plenty of fluids.   You may resume your normal diet as instructed by your doctor.   Begin with a light meal and progress to your normal diet. Heavy or fried foods are harder to digest and may make you feel sick to your stomach (nauseated).   Avoid alcoholic beverages for 24 hours or as instructed.    MEDICATIONS  You may resume your normal medications.   WHAT YOU CAN EXPECT TODAY  Some feelings of bloating in the abdomen.   Passage of more gas than usual.    IF YOU HAD A BIOPSY TAKEN DURING THE UPPER ENDOSCOPY:  Eat a soft diet IF YOU HAVE NAUSEA,  BLOATING, ABDOMINAL PAIN, OR VOMITING.    FINDING OUT THE RESULTS OF YOUR TEST Not all test results are available during your visit. DR. Darrick Penna WILL CALL YOU WITHIN 7 DAYS OF YOUR PROCEDUE WITH YOUR RESULTS. Do not assume everything is normal if you have not heard from DR. Brysan Mcevoy IN ONE WEEK, CALL HER OFFICE AT [phone].  SEEK IMMEDIATE MEDICAL ATTENTION AND CALL THE OFFICE: [phone] IF:  You have more than a spotting of blood in your stool.   Your belly is swollen (abdominal distention).   You are nauseated or vomiting.   You have a temperature over 101F.   You have abdominal pain or discomfort that is severe or gets worse throughout the day.   Gastritis  Gastritis is an inflammation (the body's way of reacting to injury and/or infection) of the stomach. It is often caused by viral or bacterial (germ) infections. It can also be caused BY ASPIRIN, BC/GOODY POWDER'S, (IBUPROFEN) MOTRIN, OR ALEVE (NAPROXEN), chemicals (including alcohol), SPICY FOODS, and medications. This illness may be associated with generalized malaise (feeling tired, not well), UPPER ABDOMINAL STOMACH cramps, and fever. One common bacterial cause of gastritis is an organism known as H. Pylori. This can be treated with antibiotics.    Low-Fat Diet  BREADS, CEREALS, PASTA, RICE, DRIED PEAS, AND BEANS These products are high in carbohydrates and most are low in fat. Therefore, they can be increased in the diet  as substitutes for fatty foods. They too, however, contain calories and should not be eaten in excess. Cereals can be eaten for snacks as well as for breakfast.  Include foods that contain fiber (fruits, vegetables, whole grains, and legumes). Research shows that fiber may lower blood cholesterol levels, especially the water-soluble fiber found in fruits, vegetables, oat products, and legumes.  FRUITS AND VEGETABLES It is good to eat fruits and vegetables. Besides being sources of fiber, both are rich in  vitamins and some minerals. They help you get the daily allowances of these nutrients. Fruits and vegetables can be used for snacks and desserts.  MEATS Limit lean meat, chicken, [place], and fish to no more than 6 ounces per day.  Beef, Pork, and Lamb Use lean cuts of beef, pork, and lamb. Lean cuts include:  Extra-lean ground beef.  Arm roast.  Sirloin tip.  Center-cut ham.  Round steak.  Loin chops.  Rump roast.  Tenderloin.  Trim all fat off the outside of meats before cooking. It is not necessary to severely decrease the intake of red meat, but lean choices should be made. Lean meat is rich in protein and contains a highly absorbable form of iron. Premenopausal women, in particular, should avoid reducing lean red meat because this could increase the risk for low red blood cells (iron-deficiency anemia).  Chicken and [place] These are good sources of protein. The fat of poultry can be reduced by removing the skin and underlying fat layers before cooking. Chicken and [place] can be substituted for lean red meat in the diet. Poultry should not be fried or covered with high-fat sauces.  Fish and Shellfish Fish is a good source of protein. Shellfish contain cholesterol, but they usually are low in saturated fatty acids. The preparation of fish is important. Like chicken and [place], they should not be fried or covered with high-fat sauces.  EGGS Egg whites contain no fat or cholesterol. They can be eaten often. Try 1 to 2 egg whites instead of whole eggs in recipes or use egg substitutes that do not contain yolk.  MILK AND DAIRY PRODUCTS Use skim or 1% milk instead of 2% or whole milk. Decrease whole milk, natural, and processed cheeses. Use nonfat or low-fat (2%) cottage cheese or low-fat cheeses made from vegetable oils. Choose nonfat or low-fat (1 to 2%) yogurt. Experiment with evaporated skim milk in recipes that call for heavy cream. Substitute low-fat yogurt or low-fat cottage cheese for  sour cream in dips and salad dressings. Have at least 2 servings of low-fat dairy products, such as 2 glasses of skim (or 1%) milk each day to help get your daily calcium intake.  FATS AND OILS Reduce the total intake of fats, especially saturated fat. Butterfat, lard, and beef fats are high in saturated fat and cholesterol. These should be avoided as much as possible. Vegetable fats do not contain cholesterol, but certain vegetable fats, such as coconut oil, palm oil, and palm kernel oil are very high in saturated fats. These should be limited. These fats are often used in bakery goods, processed foods, popcorn, oils, and nondairy creamers. Vegetable shortenings and some peanut butters contain hydrogenated oils, which are also saturated fats. Read the labels on these foods and check for saturated vegetable oils.  Unsaturated vegetable oils and fats do not raise blood cholesterol. However, they should be limited because they are fats and are high in calories. Total fat should still be limited to 30% of your daily caloric intake.  Desirable liquid vegetable oils are corn oil, cottonseed oil, olive oil, canola oil, safflower oil, soybean oil, and sunflower oil. Peanut oil is not as good, but small amounts are acceptable. Buy a heart-healthy tub margarine that has no partially hydrogenated oils in the ingredients. Mayonnaise and salad dressings often are made from unsaturated fats, but they should also be limited because of their high calorie and fat content. Seeds, nuts, peanut butter, olives, and avocados are high in fat, but the fat is mainly the unsaturated type. These foods should be limited mainly to avoid excess calories and fat.  OTHER EATING TIPS Snacks  Most sweets should be limited as snacks. They tend to be rich in calories and fats, and their caloric content outweighs their nutritional value. Some good choices in snacks are graham crackers, melba toast, soda crackers, bagels (no egg), English  muffins, fruits, and vegetables. These snacks are preferable to snack crackers, [place] fries, and chips. Popcorn should be air-popped or cooked in small amounts of liquid vegetable oil.  Desserts Eat fruit, low-fat yogurt, and fruit ices instead of pastries, cake, and cookies. Sherbet, angel food cake, gelatin dessert, frozen low-fat yogurt, or other frozen products that do not contain saturated fat (pure fruit juice bars, frozen ice pops) are also acceptable.   COOKING METHODS Choose those methods that use little or no fat. They include: Poaching.  Braising.  Steaming.  Grilling.  Baking.  Stir-frying.  Broiling.  Microwaving.  Foods can be cooked in a nonstick pan without added fat, or use a nonfat cooking spray in regular cookware. Limit fried foods and avoid frying in saturated fat. Add moisture to lean meats by using water, broth, cooking wines, and other nonfat or low-fat sauces along with the cooking methods mentioned above. Soups and stews should be chilled after cooking. The fat that forms on top after a few hours in the refrigerator should be skimmed off. When preparing meals, avoid using excess salt. Salt can contribute to raising blood pressure in some people.  EATING AWAY FROM HOME Order entres, potatoes, and vegetables without sauces or butter. When meat exceeds the size of a deck of cards (3 to 4 ounces), the rest can be taken home for another meal. Choose vegetable or fruit salads and ask for low-calorie salad dressings to be served on the side. Use dressings sparingly. Limit high-fat toppings, such as bacon, crumbled eggs, cheese, sunflower seeds, and olives. Ask for heart-healthy tub margarine instead of butter.

## 2011-06-14 ENCOUNTER — Encounter (HOSPITAL_COMMUNITY): Payer: Self-pay | Admitting: Gastroenterology

## 2011-06-18 ENCOUNTER — Telehealth: Payer: Self-pay

## 2011-06-18 NOTE — Telephone Encounter (Signed)
Pt stated that when she swallows it hurts in her chest. It feels like the food it is still whole. Is there anything she can do for this. Please advise

## 2011-06-19 ENCOUNTER — Telehealth: Payer: Self-pay | Admitting: Gastroenterology

## 2011-06-19 NOTE — Telephone Encounter (Addendum)
Please call pt. HER stomach Bx shows gastritis. Continue NEXIUM 30 minutes prior to meals DAILY. WILL CALL WITH THE RESULTS OF HER BRAVO STUDY FRI. No MRI for 3 WEEKS. AVOID TRIGGERS FOR GASTRITIS. IF SHE IS HAVING PAIN WITH SWALLOWING, SHE SHOULD FOLLOW A FULL LIQUID DIET FOR 3 DAYS THEN A SOFT MECHANICAL DIET FOR 4 MORE DAYS. SHE CONTINUE WEIGHT LOSS EFFORTS AND START HER LOW FAT DIET IN 7 DAYS.   FOLLOW UP IN 4 MOS W/ SLF E 30.  Full Liquid Diet A high-calorie, high-protein supplement should be used to meet your nutritional requirements when the full liquid diet is continued for more than 2 or 3 days. If this diet is to be used for an extended period of time (more than 7 days), a multivitamin should be considered.  Breads and Starches  Allowed: None are allowed except crackersWHOLE OR pureed (made into a thick, smooth soup) in soup. Cooked, refined corn, oat, rice, rye, and wheat cereals are also allowed.   Avoid: Any others.    Potatoes/Pasta/Rice  Allowed: ANY ITEM AS A SOUP OR SMALL PLATE OF MASHED POTATOES OR RICE.       Vegetables  Allowed: Strained tomato or vegetable juice. Vegetables pureed in soup.   Avoid: Any others.    Fruit  Allowed: Any strained fruit juices and fruit drinks. Include 1 serving of citrus or vitamin C-enriched fruit juice daily.   Avoid: Any others.  Meat and Meat Substitutes  Allowed: Egg  Avoid: Any meat, fish, or fowl. All cheese.  Milk  Allowed: SOY Milk beverages, including milk shakes and instant breakfast mixes. Smooth yogurt.   Avoid: Any others. Avoid dairy products if not tolerated.    Soups and Combination Foods  Allowed: Broth, strained cream soups. Strained, broth-based soups.   Avoid: Any others.    Desserts and Sweets  Allowed: flavored gelatin, tapioca, plain LACTOSE FREE ice cream, sherbet, smooth pudding, junket, fruit ices, frozen ice pops, pudding pops,, frozen fudge pops, chocolate syrup. Sugar, honey, jelly,  syrup.   Avoid: Any others.  Fats and Oils  Allowed: Margarine, butter, cream, sour cream, oils.   Avoid: Any others.  Beverages  Allowed: All.   Avoid: None.  Condiments  Allowed: Iodized salt, pepper, spices, flavorings. Cocoa powder.   Avoid: Any others.    SAMPLE MEAL PLAN Breakfast   cup orange juice.   1 cup cooked wheat cereal.   1 cup SOY milk.   1 cup beverage (coffee or tea).   Cream or sugar, if desired.    Midmorning Snack  2 SCRAMBLED OR HARD BOILED EGG   Lunch  1 cup cream soup.    cup fruit juice.   1 cup SOY milk.    cup custard.   1 cup beverage (coffee or tea).   Cream or sugar, if desired.    Midafternoon Snack  1 cup VANILLA SOY milk shake.  Dinner  1 cup cream soup.    cup fruit juice.   1 cup SOY MILK milk.    cup pudding.   1 cup beverage (coffee or tea).   Cream or sugar, if desired.  Evening Snack  1 cup supplement.  To increase calories, add sugar, cream, butter, or margarine if possible. Nutritional supplements will also increase the total calories.  SOFT MECHANICAL DIET This SOFT MECHANICAL DIET is restricted to:  Foods that are moist, soft-textured, and easy to chew and swallow.   Meats that are ground or are minced no  larger than one-quarter inch pieces. Meats are moist with gravy or sauce added.   Foods that do not include bread or bread-like textures except soft pancakes, well-moistened with syrup or sauce.   Textures with some chewing ability required.   Casseroles without rice.   Cooked vegetables that are less than half an inch in size and easily mashed with a fork. No cooked corn, peas, broccoli, cauliflower, cabbage, Brussels sprouts, asparagus, or other fibrous, non-tender or rubbery cooked vegetables.   Canned fruit except for pineapple. Fruit must be cut into pieces no larger than half an inch in size.   Foods that do not include nuts, seeds, coconut, or sticky textures.   FOOD  TEXTURES FOR DYSPHAGIA DIET LEVEL 2 -SOFT MECHANICAL DIET (includes all foods on Dysphagia Diet Level 1 - Pureed, in addition to the foods listed below)  FOOD GROUP: Breads. RECOMMENDED: Soft pancakes, well-moistened with syrup or sauce.  AVOID: All others.  FOOD GROUP: Cereals.  RECOMMENDED: Cooked cereals with little texture, including oatmeal. Unprocessed wheat bran stirred into cereals for bulk. Note: If thin liquids are restricted, it is important that all of the liquid is absorbed into the cereal.  AVOID: All dry cereals and any cooked cereals that may contain flax seeds or other seeds or nuts. Whole-grain, dry, or coarse cereals. Cereals with nuts, seeds, dried fruit, and/or coconut.  FOOD GROUP: Desserts. RECOMMENDED: Pudding, custard. Soft fruit pies with bottom crust only. Canned fruit (excluding pineapple). Soft, moist cakes with icing.Frozen malts, milk shakes, frozen yogurt, eggnog, nutritional supplements, ice cream, sherbet, regular or sugar-free gelatin, or any foods that become thin liquid at either room (70 F) or body temperature (98 F).  AVOID: Dry, coarse cakes and cookies. Anything with nuts, seeds, coconut, pineapple, or dried fruit. Breakfast yogurt with nuts. Rice or bread pudding.  FOOD GROUP: Fats. RECOMMENDED: Butter, margarine, cream for cereal (depending on liquid consistency recommendations), gravy, cream sauces, sour cream, sour cream dips with soft additives, mayonnaise, salad dressings, cream cheese, cream cheese spreads with soft additives, whipped toppings.  AVOID: All fats with coarse or chunky additives.  FOOD GROUP: Fruits. RECOMMENDED: Soft drained, canned, or cooked fruits without seeds or skin. Fresh soft and ripe banana. Fruit juices with a small amount of pulp. If thin liquids are restricted, fruit juices should be thickened to appropriate consistency.  AVOID: Fresh or frozen fruits. Cooked fruit with skin or seeds. Dried fruits. Fresh, canned,  or cooked pineapple.  FOOD GROUP: Meats and Meat Substitutes. (Meat pieces should not exceed 1/4 of an inch cube and should be tender.) RECOMMENDED: Moistened ground or cooked meat, poultry, or fish. Moist ground or tender meat may be served with gravy or sauce. Casseroles without rice. Moist macaroni and cheese, well-cooked pasta with meat sauce, tuna noodle casserole, soft, moist lasagna. Moist meatballs, meatloaf, or fish loaf. Protein salads, such as tuna or egg without large chunks, celery, or onion. Cottage cheese, smooth quiche without large chunks. Poached, scrambled, or soft-cooked eggs (egg yolks should not be "runny" but should be moist and able to be mashed with butter, margarine, or other moisture added to them). (Cook eggs to 160 F or use pasteurized eggs for safety.) Souffls may have small, soft chunks. Tofu. Well-cooked, slightly mashed, moist legumes, such as baked beans. All meats or protein substitutes should be served with sauces or moistened to help maintain cohesiveness in the oral cavity.  AVOID: Dry meats, tough meats (such as bacon, sausage, hot dogs, bratwurst). Dry casseroles or  casseroles with rice or large chunks. Peanut butter. Cheese slices and cubes. Hard-cooked or crisp fried eggs. Sandwiches.Pizza.  FOOD GROUP: Potatoes and Starches. RECOMMENDED: Well-cooked, moistened, boiled, baked, or mashed potatoes. Well-cooked shredded hash [place] potatoes that are not crisp. (All potatoes need to be moist and in sauces.)Well-cooked noodles in sauce. Spaetzel or soft dumplings that have been moistened with butter or gravy.  AVOID: Potato skins and chips. Fried or French-fried potatoes. Rice.  FOOD GROUP: Soups. RECOMMENDED: Soups with easy-to-chew or easy-to-swallow meats or vegetables: Particle sizes in soups should be less than 1/2 inch. Soups will need to be thickened to appropriate consistency if soup is thinner than prescribed liquid consistency.  AVOID: Soups with large  chunks of meat and vegetables. Soups with rice, corn, peas.  FOOD GROUP: Vegetables. RECOMMENDED: All soft, well-cooked vegetables. Vegetables should be less than a half inch. Should be easily mashed with a fork.  AVOID: Cooked corn and peas. Broccoli, cabbage, Brussels sprouts, asparagus, or other fibrous, non-tender or rubbery cooked vegetables.  FOOD GROUP: Miscellaneous. RECOMMENDED: Jams and preserves without seeds, jelly. Sauces, salsas, etc., that may have small tender chunks less than 1/2 inch. Soft, smooth chocolate bars that are easily chewed.  AVOID: Seeds, nuts, coconut, or sticky foods. Chewy candies such as caramels or licorice.

## 2011-06-19 NOTE — Telephone Encounter (Signed)
SEE TE 5/22

## 2011-06-19 NOTE — Telephone Encounter (Signed)
Pt aware of results 

## 2011-06-20 NOTE — Telephone Encounter (Signed)
Pt is aware of OV on 9/9 with LSL at 8am

## 2011-06-20 NOTE — Telephone Encounter (Signed)
Results Cc to PCP & P/O opv is in the computer

## 2011-06-21 ENCOUNTER — Telehealth: Payer: Self-pay | Admitting: Gastroenterology

## 2011-06-21 MED ORDER — ESOMEPRAZOLE MAGNESIUM 40 MG PO CPDR: DELAYED_RELEASE_CAPSULE | ORAL | Status: DC

## 2011-06-21 MED ORDER — BACLOFEN 10 MG PO TABS: ORAL_TABLET | ORAL | Status: AC

## 2011-06-21 NOTE — Telephone Encounter (Signed)
CALLED PT'S CELL-NW#. CALLED PT'S HOME. TAKING NEXIUM QD, EXPLAINED RESULTS. SHOWS W/O NEXIUM SHE HAS UNCONTROLLED REFLUX WITH NEXIUM HER REFLUX IS BETTER BUT NOT IDEALLY CONTROLLED. SHE NEEDS TO:  1. INCREASE NEXIUM TO BID. 2. ADD BACLOFEN TID. 3. LOSE WEIGHT. 4. LOW FAT DIET 5. OPV IN 4 MOS.  PT INSTRUCTED TO CALL IF SHE FEELS SHE NEEDS TO BE SEEN SOONER OR HAS QUESTIONS.

## 2011-06-21 NOTE — Brief Op Note (Signed)
06/11/2011  1:15 PM  PATIENT:  Haley Brady  26 y.o. female  PRE-OPERATIVE DIAGNOSIS:  chronic nausea  POST-OPERATIVE DIAGNOSIS:  ge junction at 40, hiatal hernia, gastritis, reflux   PROCEDURE:  Procedure(s): BRAVO PH STUDY ON NEXIUM 40 MG DAILY  SURGEON:  Surgeon(s): Arlyce Harman, MD  FINDINGS:  PT HAD RECORDER FOR 46  HOURS. STUDY DURATION: 46:25.   FRACTION Ph TOTAL: 10.7    TOTAL # OF REFLUXES: 217   REFLUX TABLE NUMBER OF REFLUX EPISODES: UPRIGHT 191 , SUPINE 26 (DAY 2 HAS NO SUPINE READINGS)   NUMBER OF REFLUX > 5 MINS: 7   LONGEST REFLUX: 89 MINS    DEMEESTER SCORE DAY 1:  51.9 (NL < 14.72)  DEMEESTER SCORE DAY 2:  24.8 (NL < 14.72)    DIAGNOSIS: UNCONTROLLED GERD  PLAN: 1. INCREASE NEXIUM TO BID. 2. ADD BACLOFEN TID. 3. LOSE WEIGHT. 4. LOW FAT DIET 5. OPV IN 4 MOS.

## 2011-06-25 NOTE — Telephone Encounter (Signed)
Results Cc to PCP  

## 2011-06-25 NOTE — Telephone Encounter (Signed)
Pt is aware of OV on 9/9 at 8 with LSL and appt card was mailed

## 2011-10-04 ENCOUNTER — Encounter: Payer: Self-pay | Admitting: Gastroenterology

## 2011-10-07 ENCOUNTER — Ambulatory Visit: Payer: Medicaid Other | Admitting: Gastroenterology

## 2011-10-07 ENCOUNTER — Telehealth: Payer: Self-pay | Admitting: Gastroenterology

## 2011-10-07 NOTE — Telephone Encounter (Signed)
Pt was a no show

## 2011-10-07 NOTE — Telephone Encounter (Signed)
noted 

## 2011-10-31 ENCOUNTER — Encounter (HOSPITAL_COMMUNITY): Payer: Self-pay | Admitting: *Deleted

## 2011-10-31 ENCOUNTER — Emergency Department (HOSPITAL_COMMUNITY)
Admission: EM | Admit: 2011-10-31 | Discharge: 2011-11-01 | Disposition: A | Discharge status: Home / Self Care | Payer: Medicaid Other | Attending: Emergency Medicine | Admitting: Emergency Medicine

## 2011-10-31 DIAGNOSIS — R6889 Other general symptoms and signs: Secondary | ICD-10-CM | POA: Insufficient documentation

## 2011-10-31 DIAGNOSIS — G43909 Migraine, unspecified, not intractable, without status migrainosus: Secondary | ICD-10-CM

## 2011-10-31 DIAGNOSIS — F410 Panic disorder [episodic paroxysmal anxiety] without agoraphobia: Secondary | ICD-10-CM | POA: Insufficient documentation

## 2011-10-31 DIAGNOSIS — I8390 Asymptomatic varicose veins of unspecified lower extremity: Secondary | ICD-10-CM | POA: Insufficient documentation

## 2011-10-31 DIAGNOSIS — K219 Gastro-esophageal reflux disease without esophagitis: Secondary | ICD-10-CM | POA: Insufficient documentation

## 2011-10-31 DIAGNOSIS — F17200 Nicotine dependence, unspecified, uncomplicated: Secondary | ICD-10-CM | POA: Insufficient documentation

## 2011-10-31 DIAGNOSIS — L600 Ingrowing nail: Secondary | ICD-10-CM | POA: Insufficient documentation

## 2011-10-31 DIAGNOSIS — IMO0002 Reserved for concepts with insufficient information to code with codable children: Secondary | ICD-10-CM | POA: Insufficient documentation

## 2011-10-31 HISTORY — DX: Reserved for concepts with insufficient information to code with codable children: IMO0002

## 2011-10-31 MED ORDER — HYDROMORPHONE HCL PF 2 MG/ML IJ SOLN
2.0000 mg | Freq: Once | INTRAMUSCULAR | Status: AC
  Administered 2011-10-31: 2 mg via INTRAMUSCULAR
  Filled 2011-10-31: qty 1

## 2011-10-31 MED ORDER — ONDANSETRON 4 MG PO TBDP
4.0000 mg | ORAL_TABLET | Freq: Once | ORAL | Status: AC
  Administered 2011-10-31: 4 mg via ORAL
  Filled 2011-10-31: qty 1

## 2011-10-31 NOTE — ED Notes (Signed)
Headache, onset last night , no relief with meds taken at home, Hx of migraines.  Vomited x1.  No HI

## 2011-10-31 NOTE — ED Provider Notes (Signed)
History     CSN: [id number]  Arrival date & time 10/31/11  2213   First MD Initiated Contact with Patient 10/31/11 2327      Chief Complaint  Patient presents with  . Headache    (Consider location/radiation/quality/duration/timing/severity/associated sxs/prior treatment) HPI Comments: Headache since yest.  Typical of previous migraines but is lasting longer.  N/v x 1.  + photophobia.  Took 1 hydrocodone with no relief.  Patient is a 26 y.o. female presenting with headaches. The history is provided by the patient. No language interpreter was used.  Headache  This is a new problem. The current episode started yesterday. The problem occurs constantly. The headache is associated with bright light. The pain is located in the bilateral region. The quality of the pain is described as throbbing. The pain is severe. The pain does not radiate. Associated symptoms include nausea and vomiting. Pertinent negatives include no fever. She has tried oral narcotic analgesics for the symptoms.    Past Medical History  Diagnosis Date  . Panic disorder without agoraphobia   . Esophageal reflux   . Asymptomatic varicose veins   . Ingrowing nail   . Depression   . Chronic back pain   . Abnormal pap   . DDD (degenerative disc disease)     Past Surgical History  Procedure Date  . Tonsillectomy   . Esophagogastroduodenoscopy 06/11/2011     SMALL HIATAL HERNIA/Moderate gastritis/  BURNING CHESTPAIN: GERD, NERD, OR NON-ULCER DYSPEPSIA/ Nl esophagus    Family History  Problem Relation Age of Onset  . Pancreatic cancer Father 84    History  Substance Use Topics  . Smoking status: Current Some Day Smoker    Types: Cigarettes  . Smokeless tobacco: Not on file  . Alcohol Use: Yes     occasionally    OB History    Grav Para Term Preterm Abortions TAB SAB Ect Mult Living                  Review of Systems  Constitutional: Negative for fever and chills.  Gastrointestinal: Positive for  nausea and vomiting.  Neurological: Positive for headaches.  Psychiatric/Behavioral: Negative for confusion.  All other systems reviewed and are negative.    Allergies  Review of patient's allergies indicates no known allergies.  Home Medications   Current Outpatient Rx  Name Route Sig Dispense Refill  . ALPRAZOLAM 0.25 MG PO TABS Oral Take 0.25 mg by mouth at bedtime as needed. Anxiety    . BACLOFEN 10 MG PO TABS Oral Take 10 mg by mouth 3 (three) times daily.    Marland Kitchen ESCITALOPRAM OXALATE 20 MG PO TABS Oral Take 20 mg by mouth daily.    Marland Kitchen ESOMEPRAZOLE MAGNESIUM 40 MG PO CPDR Oral Take 40 mg by mouth 2 (two) times daily.    Marland Kitchen HYDROCODONE-ACETAMINOPHEN 10-325 MG PO TABS Oral Take 1 tablet by mouth every 6 (six) hours as needed. Pain    . IBUPROFEN 200 MG PO TABS Oral Take 600 mg by mouth every 6 (six) hours as needed. Pain    . TOPIRAMATE 25 MG PO TABS Oral Take 25 mg by mouth 2 (two) times daily.      BP 128/83  Pulse 88  Temp 98.1 F (36.7 C) (Oral)  Resp 24  Ht 5\' 4"  (1.626 m)  Wt 255 lb (115.667 kg)  BMI 43.77 kg/m2  SpO2 98%  LMP 10/21/2011  Physical Exam  Nursing note and vitals reviewed. Constitutional: She is oriented  to person, place, and time. She appears well-developed and well-nourished. No distress.  HENT:  Head: Normocephalic and atraumatic.  Right Ear: External ear normal.  Nose: Nose normal.  Eyes: EOM are normal. Pupils are equal, round, and reactive to light.  Neck: Normal range of motion.  Cardiovascular: Normal rate, regular rhythm and normal heart sounds.   Pulmonary/Chest: Effort normal and breath sounds normal. No respiratory distress.  Abdominal: Soft. She exhibits no distension. There is no tenderness.  Musculoskeletal: Normal range of motion.  Neurological: She is alert and oriented to person, place, and time. She has normal strength. No cranial nerve deficit or sensory deficit. She displays no seizure activity. Coordination and gait normal. GCS  eye subscore is 4. GCS verbal subscore is 5. GCS motor subscore is 6.  Skin: Skin is warm and dry. She is not diaphoretic.  Psychiatric: She has a normal mood and affect. Judgment normal.    ED Course  Procedures (including critical care time)  Labs Reviewed - No data to display No results found.   1. Migraine headache       MDM  No acute neurologic findings.        Evalina Field, [place] 11/02/11 [phone]

## 2011-11-01 NOTE — ED Notes (Signed)
Pt discharged. Pt stable at time of discharge. pt has no questions regarding discharge at this time. Pt voiced understanding of discharge instructions.  

## 2011-11-04 NOTE — ED Provider Notes (Signed)
Medical screening examination/treatment/procedure(s) were performed by non-physician practitioner and as supervising physician I was immediately available for consultation/collaboration.  Donnetta Hutching, MD 11/04/11 [phone]

## 2011-12-30 ENCOUNTER — Encounter (HOSPITAL_COMMUNITY): Payer: Self-pay | Admitting: *Deleted

## 2011-12-30 ENCOUNTER — Emergency Department (HOSPITAL_COMMUNITY)
Admission: EM | Admit: 2011-12-30 | Discharge: 2011-12-30 | Disposition: A | Discharge status: Home / Self Care | Payer: Medicaid Other | Attending: Emergency Medicine | Admitting: Emergency Medicine

## 2011-12-30 DIAGNOSIS — F329 Major depressive disorder, single episode, unspecified: Secondary | ICD-10-CM | POA: Insufficient documentation

## 2011-12-30 DIAGNOSIS — H53149 Visual discomfort, unspecified: Secondary | ICD-10-CM | POA: Insufficient documentation

## 2011-12-30 DIAGNOSIS — R110 Nausea: Secondary | ICD-10-CM | POA: Insufficient documentation

## 2011-12-30 DIAGNOSIS — F3289 Other specified depressive episodes: Secondary | ICD-10-CM | POA: Insufficient documentation

## 2011-12-30 DIAGNOSIS — F410 Panic disorder [episodic paroxysmal anxiety] without agoraphobia: Secondary | ICD-10-CM | POA: Insufficient documentation

## 2011-12-30 DIAGNOSIS — F17200 Nicotine dependence, unspecified, uncomplicated: Secondary | ICD-10-CM | POA: Insufficient documentation

## 2011-12-30 DIAGNOSIS — IMO0002 Reserved for concepts with insufficient information to code with codable children: Secondary | ICD-10-CM | POA: Insufficient documentation

## 2011-12-30 DIAGNOSIS — Z79899 Other long term (current) drug therapy: Secondary | ICD-10-CM | POA: Insufficient documentation

## 2011-12-30 DIAGNOSIS — M549 Dorsalgia, unspecified: Secondary | ICD-10-CM | POA: Insufficient documentation

## 2011-12-30 DIAGNOSIS — G8929 Other chronic pain: Secondary | ICD-10-CM | POA: Insufficient documentation

## 2011-12-30 DIAGNOSIS — K219 Gastro-esophageal reflux disease without esophagitis: Secondary | ICD-10-CM | POA: Insufficient documentation

## 2011-12-30 DIAGNOSIS — G43909 Migraine, unspecified, not intractable, without status migrainosus: Secondary | ICD-10-CM | POA: Insufficient documentation

## 2011-12-30 DIAGNOSIS — I8390 Asymptomatic varicose veins of unspecified lower extremity: Secondary | ICD-10-CM | POA: Insufficient documentation

## 2011-12-30 MED ORDER — HYDROMORPHONE HCL PF 1 MG/ML IJ SOLN
2.0000 mg | Freq: Once | INTRAMUSCULAR | Status: AC
  Administered 2011-12-30: 2 mg via INTRAMUSCULAR
  Filled 2011-12-30: qty 2

## 2011-12-30 MED ORDER — ONDANSETRON 8 MG PO TBDP
8.0000 mg | ORAL_TABLET | Freq: Once | ORAL | Status: AC
  Administered 2011-12-30: 8 mg via ORAL
  Filled 2011-12-30: qty 1

## 2011-12-30 NOTE — ED Notes (Signed)
Headache since last pm. Nausea,no vomiting.  No Head injury

## 2011-12-31 NOTE — ED Provider Notes (Signed)
Medical screening examination/treatment/procedure(s) were performed by non-physician practitioner and as supervising physician I was immediately available for consultation/collaboration.  Avamarie Crossley, MD 12/31/11 1950 

## 2011-12-31 NOTE — ED Provider Notes (Signed)
History     CSN: [id number]  Arrival date & time 12/30/11  1850   First MD Initiated Contact with Patient 12/30/11 2017      Chief Complaint  Patient presents with  . Headache    (Consider location/radiation/quality/duration/timing/severity/associated sxs/prior treatment) HPI Comments: Ms Redditt presents with migraine headache which started yesterday.  The patient has a history of migraine and the current symptoms are similar to previous episodes of migraine headache.  The patients symptoms are not preceded by prodromal symptoms.  The patient has bilateral headache in association with neusea without emesis.  She also describes photophobia which is typical of her migraine episdes.  There has been no fevers, chills, syncope, confusion or localized weakness.  The patient tried topamax (takes daily) and hydrocodone without relief of symptoms.   The history is provided by the patient.    Past Medical History  Diagnosis Date  . Panic disorder without agoraphobia   . Esophageal reflux   . Asymptomatic varicose veins   . Ingrowing nail   . Depression   . Chronic back pain   . Abnormal pap   . DDD (degenerative disc disease)     Past Surgical History  Procedure Date  . Tonsillectomy   . Esophagogastroduodenoscopy 06/11/2011     SMALL HIATAL HERNIA/Moderate gastritis/  BURNING CHESTPAIN: GERD, NERD, OR NON-ULCER DYSPEPSIA/ Nl esophagus    Family History  Problem Relation Age of Onset  . Pancreatic cancer Father 84    History  Substance Use Topics  . Smoking status: Current Some Day Smoker    Types: Cigarettes  . Smokeless tobacco: Not on file  . Alcohol Use: Yes     Comment: occasionally    OB History    Grav Para Term Preterm Abortions TAB SAB Ect Mult Living                  Review of Systems  Constitutional: Negative for fever and chills.  HENT: Negative for congestion, sore throat and neck pain.   Eyes: Positive for photophobia.  Respiratory: Negative for chest  tightness and shortness of breath.   Cardiovascular: Negative for chest pain.  Gastrointestinal: Positive for nausea. Negative for vomiting and abdominal pain.  Genitourinary: Negative.   Musculoskeletal: Negative for joint swelling and arthralgias.  Skin: Negative.  Negative for rash and wound.  Neurological: Positive for headaches. Negative for dizziness, weakness, light-headedness and numbness.  Hematological: Negative.   Psychiatric/Behavioral: Negative.     Allergies  Review of patient's allergies indicates no known allergies.  Home Medications   Current Outpatient Rx  Name  Route  Sig  Dispense  Refill  . ALPRAZOLAM 0.25 MG PO TABS   Oral   Take 0.25 mg by mouth at bedtime as needed. Anxiety         . BACLOFEN 10 MG PO TABS   Oral   Take 10 mg by mouth 3 (three) times daily.         Marland Kitchen ESCITALOPRAM OXALATE 20 MG PO TABS   Oral   Take 20 mg by mouth daily.         Marland Kitchen ESOMEPRAZOLE MAGNESIUM 40 MG PO CPDR   Oral   Take 40 mg by mouth 2 (two) times daily.         Marland Kitchen HYDROCODONE-ACETAMINOPHEN 10-325 MG PO TABS   Oral   Take 1 tablet by mouth every 6 (six) hours as needed. Pain         . IBUPROFEN 200 MG  PO TABS   Oral   Take 600 mg by mouth every 6 (six) hours as needed. Pain         . TOPIRAMATE 25 MG PO TABS   Oral   Take 25 mg by mouth 2 (two) times daily.           BP 147/93  Pulse 72  Temp 98 F (36.7 C) (Oral)  Resp 20  Ht 5\' 5"  (1.651 m)  Wt 253 lb (114.76 kg)  BMI 42.10 kg/m2  SpO2 99%  LMP 12/23/2011  Physical Exam  Nursing note and vitals reviewed. Constitutional: She is oriented to person, place, and time. She appears well-developed and well-nourished.       Uncomfortable appearing  HENT:  Head: Normocephalic and atraumatic.  Mouth/Throat: Oropharynx is clear and moist.  Eyes: EOM are normal. Pupils are equal, round, and reactive to light.  Neck: Normal range of motion. Neck supple.  Cardiovascular: Normal rate and normal  heart sounds.   Pulmonary/Chest: Effort normal.  Abdominal: Soft. There is no tenderness.  Musculoskeletal: Normal range of motion.  Lymphadenopathy:    She has no cervical adenopathy.  Neurological: She is alert and oriented to person, place, and time. She has normal strength. No sensory deficit. Gait normal. GCS eye subscore is 4. GCS verbal subscore is 5. GCS motor subscore is 6.       Normal heel-shin, normal rapid alternating movements. Cranial nerves III-XII intact.  No pronator drift.  Skin: Skin is warm and dry. No rash noted.  Psychiatric: She has a normal mood and affect. Her speech is normal and behavior is normal. Thought content normal. Cognition and memory are normal.    ED Course  Procedures (including critical care time)  Labs Reviewed - No data to display No results found.   1. Migraine headache    Reviewed prior tx for migraine headache visits - pt reports responds the best to dilaudid and zofran. Given dilaudid 2 mg IM,  zofran odt.  Near complete relief of headache at time of dc.  Mother at bedside to drive pt home.   MDM  Classic migraine with relief from IM narcotic injections.  Pt has tolerated PO fluids while here.  Encouraged to f/u with pcp for recheck if headache returns or persists.  Has been pt of [facility] in the past,  Expresses desire to get rechecked by them - encouraged to ask pcp to refer her back to this headache specialist if headaches become more frequent.  The patient appears reasonably screened and/or stabilized for discharge and I doubt any other medical condition or other [facility] requiring further screening, evaluation, or treatment in the ED at this time prior to discharge.         Burgess Amor, [place] 12/31/11 1241

## 2012-04-15 ENCOUNTER — Telehealth: Payer: Self-pay | Admitting: Gastroenterology

## 2012-04-15 NOTE — Telephone Encounter (Signed)
Samples of Nexium 40 mg tablets at front for pick up to take one tablet bid.

## 2012-04-15 NOTE — Telephone Encounter (Signed)
Patient has been taking Nexium 40 mg twice a day and its costing $545 is what the pharmacy is stating and she cant afford this and she asking for samples until Medicaid is straighted out

## 2012-05-18 ENCOUNTER — Telehealth: Payer: Self-pay | Admitting: Gastroenterology

## 2012-05-18 NOTE — Telephone Encounter (Signed)
Error

## 2012-05-25 ENCOUNTER — Emergency Department (HOSPITAL_COMMUNITY)
Admission: EM | Admit: 2012-05-25 | Discharge: 2012-05-25 | Disposition: A | Discharge status: Home / Self Care | Payer: Self-pay | Attending: Emergency Medicine | Admitting: Emergency Medicine

## 2012-05-25 ENCOUNTER — Encounter (HOSPITAL_COMMUNITY): Payer: Self-pay | Admitting: *Deleted

## 2012-05-25 DIAGNOSIS — G43909 Migraine, unspecified, not intractable, without status migrainosus: Secondary | ICD-10-CM | POA: Insufficient documentation

## 2012-05-25 DIAGNOSIS — F410 Panic disorder [episodic paroxysmal anxiety] without agoraphobia: Secondary | ICD-10-CM | POA: Insufficient documentation

## 2012-05-25 DIAGNOSIS — K219 Gastro-esophageal reflux disease without esophagitis: Secondary | ICD-10-CM | POA: Insufficient documentation

## 2012-05-25 DIAGNOSIS — H538 Other visual disturbances: Secondary | ICD-10-CM | POA: Insufficient documentation

## 2012-05-25 DIAGNOSIS — Z872 Personal history of diseases of the skin and subcutaneous tissue: Secondary | ICD-10-CM | POA: Insufficient documentation

## 2012-05-25 DIAGNOSIS — G8929 Other chronic pain: Secondary | ICD-10-CM | POA: Insufficient documentation

## 2012-05-25 DIAGNOSIS — F3289 Other specified depressive episodes: Secondary | ICD-10-CM | POA: Insufficient documentation

## 2012-05-25 DIAGNOSIS — Z8679 Personal history of other diseases of the circulatory system: Secondary | ICD-10-CM | POA: Insufficient documentation

## 2012-05-25 DIAGNOSIS — Z8739 Personal history of other diseases of the musculoskeletal system and connective tissue: Secondary | ICD-10-CM | POA: Insufficient documentation

## 2012-05-25 DIAGNOSIS — R112 Nausea with vomiting, unspecified: Secondary | ICD-10-CM | POA: Insufficient documentation

## 2012-05-25 DIAGNOSIS — F329 Major depressive disorder, single episode, unspecified: Secondary | ICD-10-CM | POA: Insufficient documentation

## 2012-05-25 DIAGNOSIS — F17200 Nicotine dependence, unspecified, uncomplicated: Secondary | ICD-10-CM | POA: Insufficient documentation

## 2012-05-25 DIAGNOSIS — Z79899 Other long term (current) drug therapy: Secondary | ICD-10-CM | POA: Insufficient documentation

## 2012-05-25 HISTORY — DX: Migraine, unspecified, not intractable, without status migrainosus: G43.909

## 2012-05-25 MED ORDER — METOCLOPRAMIDE HCL 5 MG/ML IJ SOLN
10.0000 mg | Freq: Once | INTRAMUSCULAR | Status: AC
  Administered 2012-05-25: 10 mg via INTRAVENOUS
  Filled 2012-05-25: qty 2

## 2012-05-25 MED ORDER — ONDANSETRON HCL 4 MG/2ML IJ SOLN
4.0000 mg | Freq: Once | INTRAMUSCULAR | Status: AC
  Administered 2012-05-25: 4 mg via INTRAVENOUS
  Filled 2012-05-25: qty 2

## 2012-05-25 MED ORDER — SODIUM CHLORIDE 0.9 % IV BOLUS (SEPSIS)
1000.0000 mL | Freq: Once | INTRAVENOUS | Status: AC
  Administered 2012-05-25: 1000 mL via INTRAVENOUS

## 2012-05-25 MED ORDER — HYDROMORPHONE HCL PF 1 MG/ML IJ SOLN
1.0000 mg | Freq: Once | INTRAMUSCULAR | Status: AC
  Administered 2012-05-25: 1 mg via INTRAVENOUS
  Filled 2012-05-25: qty 1

## 2012-05-25 NOTE — ED Notes (Signed)
Pt states migraine that began at noon today, states it has gotten worse until tonight. Pt has history of migraines. Also notes some blurred vision and photosensitivity.

## 2012-05-25 NOTE — ED Notes (Signed)
Headache with nausea

## 2012-05-25 NOTE — ED Provider Notes (Signed)
History    This chart was scribed for Rosio Weiss B. Bernette Mayers, MD by Gerlean Ren, ED Scribe. This patient was seen in room [facility]12[facility] and the patient's care was started at 9:56 PM    CSN: [id number]  Arrival date & time 05/25/12  2145   First MD Initiated Contact with Patient 05/25/12 2153      Chief Complaint  Patient presents with  . Migraine    The history is provided by the patient. No language interpreter was used.  Haley Brady is a 27 y.o. female who presents to the Emergency Department complaining of a constant migraine with associated blurred vision, nausea, and several episodes of non-bloody emesis with gradual onset at 12:00 PM today.  Pt has h/o migraines but states this one is the most severe and that the blurred vision is most severe.  Pt reports she has not had migraine in several months but that when they are as severe as the current migraine she comes here for nausea and pain medicine.  Pt currently takes Topamax prescribed from neurologist.     Past Medical History  Diagnosis Date  . Panic disorder without agoraphobia   . Esophageal reflux   . Asymptomatic varicose veins   . Ingrowing nail   . Depression   . Chronic back pain   . Abnormal pap   . DDD (degenerative disc disease)   . Migraines     Past Surgical History  Procedure Laterality Date  . Tonsillectomy    . Esophagogastroduodenoscopy  06/11/2011     SMALL HIATAL HERNIA/Moderate gastritis/  BURNING CHESTPAIN: GERD, NERD, OR NON-ULCER DYSPEPSIA/ Nl esophagus    Family History  Problem Relation Age of Onset  . Pancreatic cancer Father 59    History  Substance Use Topics  . Smoking status: Current Some Day Smoker    Types: Cigarettes  . Smokeless tobacco: Not on file  . Alcohol Use: Yes     Comment: occasionally    No OB history provided.   Review of Systems  Constitutional: Negative for fever and chills.  Eyes:       Positive blurred vision  Respiratory: Negative for shortness of breath.    Gastrointestinal: Positive for nausea and vomiting.  Neurological: Positive for headaches. Negative for weakness.  All other systems reviewed and are negative.    Allergies  Review of patient's allergies indicates no known allergies.  Home Medications   Current Outpatient Rx  Name  Route  Sig  Dispense  Refill  . ALPRAZolam (XANAX) 0.25 MG tablet   Oral   Take 0.25 mg by mouth at bedtime as needed. Anxiety         . baclofen (LIORESAL) 10 MG tablet   Oral   Take 10 mg by mouth 3 (three) times daily.         Marland Kitchen escitalopram (LEXAPRO) 20 MG tablet   Oral   Take 20 mg by mouth daily.         Marland Kitchen esomeprazole (NEXIUM) 40 MG capsule   Oral   Take 40 mg by mouth 2 (two) times daily.         Marland Kitchen HYDROcodone-acetaminophen (NORCO) 10-325 MG per tablet   Oral   Take 1 tablet by mouth every 6 (six) hours as needed. Pain         . ibuprofen (ADVIL,MOTRIN) 200 MG tablet   Oral   Take 600 mg by mouth every 6 (six) hours as needed. Pain         .  topiramate (TOPAMAX) 25 MG tablet   Oral   Take 25 mg by mouth 2 (two) times daily.           BP 164/102  Pulse 134  Temp(Src) 98.7 F (37.1 C) (Oral)  Resp 24  Ht 5\' 3"  (1.6 m)  Wt 253 lb (114.76 kg)  BMI 44.83 kg/m2  SpO2 99%  Physical Exam  Nursing note and vitals reviewed. Constitutional: She is oriented to person, place, and time. She appears well-developed and well-nourished.  HENT:  Head: Normocephalic and atraumatic.  Eyes: EOM are normal. Pupils are equal, round, and reactive to light.  Neck: Normal range of motion. Neck supple.  Cardiovascular: Normal rate, normal heart sounds and intact distal pulses.   Pulmonary/Chest: Effort normal and breath sounds normal.  Abdominal: Bowel sounds are normal. She exhibits no distension. There is no tenderness.  Musculoskeletal: Normal range of motion. She exhibits no edema and no tenderness.  Neurological: She is alert and oriented to person, place, and time. She has  normal strength. No cranial nerve deficit or sensory deficit.  Skin: Skin is warm and dry. No rash noted.  Psychiatric: She has a normal mood and affect.    ED Course  Procedures (including critical care time) DIAGNOSTIC STUDIES: Oxygen Saturation is 99% on room air, normal by my interpretation.    COORDINATION OF CARE: 9:59 PM- Patient informed of clinical course, understands medical decision-making process, and agrees with plan.  Labs Reviewed - No data to display No results found.   1. Migraine       MDM  Pt with history of migraines has not had headache in several months. Severe headache today. Typically gets good relief with dilaudid and zofran but today still having residual headache. Given additional dose of dilaudid and reglan. IVF bolus and anticipate discharge with PCP followup.       I personally performed the services described in this documentation, which was scribed in my presence. The recorded information has been reviewed and is accurate.     Thad Osoria B. Bernette Mayers, MD 05/25/12 2246

## 2012-07-06 ENCOUNTER — Other Ambulatory Visit: Payer: Self-pay

## 2012-07-07 MED ORDER — BACLOFEN 10 MG PO TABS: 10.0000 mg | ORAL_TABLET | Freq: Three times a day (TID) | ORAL | Status: DC

## 2012-07-09 ENCOUNTER — Telehealth: Payer: Self-pay

## 2012-07-09 MED ORDER — OMEPRAZOLE 20 MG PO CPDR: DELAYED_RELEASE_CAPSULE | ORAL | Status: DC

## 2012-07-09 NOTE — Telephone Encounter (Signed)
PLEASE CALL PT. SHE HAS NOT BEEN SEEN IN CLINIC FOR OVER A YEAR. SHE NEEDS THE FIRST AVAILABLE OPV E30. SHE SHOULD CHANGE TO OMEPRAZOLE 20 MG PRIOR TO MEALS TID. RX SENT. SHE WILL BE GIVE 2 REFILLS. SHE WILL NOT BE GIVEN MORE REFILLS UNLESS SHE IS SEEN IN CLINIC. SHE NEEDS TO FOLLOW A LOW FAT DIET. SHE MAY PICK UP A HO OR WE CAN MAIL IT TO HER. SHE SHOULD LOSE WEIGHT AND STOP  SMOKING.

## 2012-07-09 NOTE — Telephone Encounter (Signed)
Pt can not afford the Nexium at the drug store. She would like to know what she can take. I told her to get the Nexium over the counter but it is only 20 mg and she takes 40 mg BID. Please advise what she can do.

## 2012-07-09 NOTE — Telephone Encounter (Signed)
LMOM to call back

## 2012-07-14 ENCOUNTER — Encounter (HOSPITAL_COMMUNITY): Payer: Self-pay | Admitting: Emergency Medicine

## 2012-07-14 ENCOUNTER — Emergency Department (HOSPITAL_COMMUNITY)
Admission: EM | Admit: 2012-07-14 | Discharge: 2012-07-14 | Disposition: A | Discharge status: Home / Self Care | Payer: Medicaid Other | Attending: Emergency Medicine | Admitting: Emergency Medicine

## 2012-07-14 DIAGNOSIS — F3289 Other specified depressive episodes: Secondary | ICD-10-CM | POA: Insufficient documentation

## 2012-07-14 DIAGNOSIS — Z79899 Other long term (current) drug therapy: Secondary | ICD-10-CM | POA: Insufficient documentation

## 2012-07-14 DIAGNOSIS — H539 Unspecified visual disturbance: Secondary | ICD-10-CM | POA: Insufficient documentation

## 2012-07-14 DIAGNOSIS — R55 Syncope and collapse: Secondary | ICD-10-CM | POA: Insufficient documentation

## 2012-07-14 DIAGNOSIS — G8929 Other chronic pain: Secondary | ICD-10-CM | POA: Insufficient documentation

## 2012-07-14 DIAGNOSIS — R42 Dizziness and giddiness: Secondary | ICD-10-CM | POA: Insufficient documentation

## 2012-07-14 DIAGNOSIS — F329 Major depressive disorder, single episode, unspecified: Secondary | ICD-10-CM | POA: Insufficient documentation

## 2012-07-14 DIAGNOSIS — Z791 Long term (current) use of non-steroidal anti-inflammatories (NSAID): Secondary | ICD-10-CM | POA: Insufficient documentation

## 2012-07-14 DIAGNOSIS — Z3202 Encounter for pregnancy test, result negative: Secondary | ICD-10-CM | POA: Insufficient documentation

## 2012-07-14 DIAGNOSIS — H53149 Visual discomfort, unspecified: Secondary | ICD-10-CM | POA: Insufficient documentation

## 2012-07-14 DIAGNOSIS — F17200 Nicotine dependence, unspecified, uncomplicated: Secondary | ICD-10-CM | POA: Insufficient documentation

## 2012-07-14 DIAGNOSIS — K219 Gastro-esophageal reflux disease without esophagitis: Secondary | ICD-10-CM | POA: Insufficient documentation

## 2012-07-14 DIAGNOSIS — F410 Panic disorder [episodic paroxysmal anxiety] without agoraphobia: Secondary | ICD-10-CM | POA: Insufficient documentation

## 2012-07-14 DIAGNOSIS — R110 Nausea: Secondary | ICD-10-CM | POA: Insufficient documentation

## 2012-07-14 DIAGNOSIS — R61 Generalized hyperhidrosis: Secondary | ICD-10-CM | POA: Insufficient documentation

## 2012-07-14 DIAGNOSIS — Z8739 Personal history of other diseases of the musculoskeletal system and connective tissue: Secondary | ICD-10-CM | POA: Insufficient documentation

## 2012-07-14 DIAGNOSIS — G43909 Migraine, unspecified, not intractable, without status migrainosus: Secondary | ICD-10-CM

## 2012-07-14 DIAGNOSIS — Z8679 Personal history of other diseases of the circulatory system: Secondary | ICD-10-CM | POA: Insufficient documentation

## 2012-07-14 DIAGNOSIS — R109 Unspecified abdominal pain: Secondary | ICD-10-CM | POA: Insufficient documentation

## 2012-07-14 DIAGNOSIS — R079 Chest pain, unspecified: Secondary | ICD-10-CM | POA: Insufficient documentation

## 2012-07-14 DIAGNOSIS — M549 Dorsalgia, unspecified: Secondary | ICD-10-CM | POA: Insufficient documentation

## 2012-07-14 LAB — URINE MICROSCOPIC-ADD ON

## 2012-07-14 LAB — URINALYSIS, ROUTINE W REFLEX MICROSCOPIC
Bilirubin Urine: NEGATIVE
Nitrite: NEGATIVE
Specific Gravity, Urine: 1.025 (ref 1.005–1.030)
Urobilinogen, UA: 0.2 mg/dL (ref 0.0–1.0)
pH: 6 (ref 5.0–8.0)

## 2012-07-14 LAB — CBC WITH DIFFERENTIAL/PLATELET
Basophils Absolute: 0 10*3/uL (ref 0.0–0.1)
Eosinophils Absolute: 0.2 10*3/uL (ref 0.0–0.7)
Eosinophils Relative: 2 % (ref 0–5)
Lymphocytes Relative: 31 % (ref 12–46)
Lymphs Abs: 2.7 10*3/uL (ref 0.7–4.0)
MCH: 31.4 pg (ref 26.0–34.0)
MCV: 92.3 fL (ref 78.0–100.0)
Neutrophils Relative %: 56 % (ref 43–77)
Platelets: 279 10*3/uL (ref 150–400)
RBC: 3.76 MIL/uL — ABNORMAL LOW (ref 3.87–5.11)
RDW: 12.1 % (ref 11.5–15.5)
WBC: 8.8 10*3/uL (ref 4.0–10.5)

## 2012-07-14 LAB — BASIC METABOLIC PANEL
BUN: 13 mg/dL (ref 6–23)
CO2: 27 mEq/L (ref 19–32)
Chloride: 102 mEq/L (ref 96–112)
Creatinine, Ser: 0.85 mg/dL (ref 0.50–1.10)

## 2012-07-14 MED ORDER — SODIUM CHLORIDE 0.9 % IV BOLUS (SEPSIS)
1000.0000 mL | Freq: Once | INTRAVENOUS | Status: AC
  Administered 2012-07-14: 1000 mL via INTRAVENOUS

## 2012-07-14 MED ORDER — METOCLOPRAMIDE HCL 5 MG/ML IJ SOLN
10.0000 mg | Freq: Once | INTRAMUSCULAR | Status: AC
  Administered 2012-07-14: 10 mg via INTRAVENOUS
  Filled 2012-07-14: qty 2

## 2012-07-14 MED ORDER — HYDROMORPHONE HCL PF 1 MG/ML IJ SOLN
1.0000 mg | Freq: Once | INTRAMUSCULAR | Status: AC
  Administered 2012-07-14: 1 mg via INTRAVENOUS
  Filled 2012-07-14: qty 1

## 2012-07-14 MED ORDER — SODIUM CHLORIDE 0.9 % IV SOLN: INTRAVENOUS | Status: DC

## 2012-07-14 MED ORDER — ONDANSETRON HCL 4 MG/2ML IJ SOLN
4.0000 mg | Freq: Once | INTRAMUSCULAR | Status: AC
  Administered 2012-07-14: 4 mg via INTRAVENOUS
  Filled 2012-07-14: qty 2

## 2012-07-14 MED ORDER — DIPHENHYDRAMINE HCL 50 MG/ML IJ SOLN
12.5000 mg | Freq: Once | INTRAMUSCULAR | Status: AC
  Administered 2012-07-14: 12.5 mg via INTRAVENOUS
  Filled 2012-07-14: qty 1

## 2012-07-14 NOTE — ED Notes (Addendum)
Pt reports she was at work this am when pt became dizzy and diaphoretic. Pt reports her head began to hurt all of a sudden. Pt reports nausea,light sensitivity at this time and reports a hx of migraines. Pt reports she may have "blacked out at work."  nad noted.

## 2012-07-14 NOTE — ED Notes (Signed)
MD at bedside. 

## 2012-07-14 NOTE — ED Provider Notes (Signed)
History     This chart was scribed for Shelda Jakes, MD, MD by Smitty Pluck, ED Scribe. The patient was seen in room APA16A/APA16A and the patient's care was started at 1:25PM.   CSN: [id number]  Arrival date & time 07/14/12  1152    Chief Complaint  Patient presents with  . Migraine  . Dizziness    Patient is a 27 y.o. female presenting with migraines. The history is provided by the patient and medical records. No language interpreter was used.  Migraine This is a recurrent problem. The current episode started 6 to 12 hours ago. The problem occurs constantly. The problem has been resolved. Associated symptoms include chest pain, abdominal pain and headaches. Nothing aggravates the symptoms. Nothing relieves the symptoms.   HPI Comments: Haley Brady is a 27 y.o. female who presents to the Emergency Department complaining of constant, moderate bilateral temporal radiating to posterior neck migraine onset today around 6AM. She states the headache throbs and aches. She reports that she had sudden dizziness and diaphoresis at work today and she said she sat down then had syncope. Pt reports that when she regained consciousness she had the HA. She mentions that she has photophobia and having double vision earlier today. She mentions having lower chest pain and epigastric pain earlier today. Current HA reminds pt of past migraines. Pt denies head injury, fever, chills, vomiting, diarrhea, weakness, cough, SOB and any other pain.   PCP is Dr. Margo Aye   Past Medical History  Diagnosis Date  . Panic disorder without agoraphobia   . Esophageal reflux   . Asymptomatic varicose veins   . Ingrowing nail   . Depression   . Chronic back pain   . Abnormal pap   . DDD (degenerative disc disease)   . Migraines     Past Surgical History  Procedure Laterality Date  . Tonsillectomy    . Esophagogastroduodenoscopy  06/11/2011     SMALL HIATAL HERNIA/Moderate gastritis/  BURNING CHESTPAIN:  GERD, NERD, OR NON-ULCER DYSPEPSIA/ Nl esophagus    Family History  Problem Relation Age of Onset  . Pancreatic cancer Father 98    History  Substance Use Topics  . Smoking status: Current Some Day Smoker    Types: Cigarettes  . Smokeless tobacco: Not on file     Comment: pt reports '4 cigarettes per day."  . Alcohol Use: Yes     Comment: occasionally    OB History   Grav Para Term Preterm Abortions TAB SAB Ect Mult Living                  Review of Systems  Constitutional: Positive for diaphoresis. Negative for fever and chills.  HENT: Negative for congestion and rhinorrhea.   Eyes: Positive for photophobia and visual disturbance.  Cardiovascular: Positive for chest pain. Negative for leg swelling.  Gastrointestinal: Positive for nausea and abdominal pain. Negative for vomiting and diarrhea.  Genitourinary: Negative for dysuria.  Musculoskeletal: Positive for back pain.  Skin: Negative for rash.  Neurological: Positive for dizziness, syncope and headaches.  Hematological: Does not bruise/bleed easily.  Psychiatric/Behavioral: Negative for confusion.  All other systems reviewed and are negative.    Allergies  Review of patient's allergies indicates no known allergies.  Home Medications   Current Outpatient Rx  Name  Route  Sig  Dispense  Refill  . ALPRAZolam (XANAX) 0.5 MG tablet   Oral   Take 0.5 mg by mouth 2 (two) times daily as needed  for sleep or anxiety.         . baclofen (LIORESAL) 10 MG tablet   Oral   Take 1 tablet (10 mg total) by mouth 3 (three) times daily before meals.   93 tablet   11   . escitalopram (LEXAPRO) 20 MG tablet   Oral   Take 20 mg by mouth daily.         Marland Kitchen esomeprazole (NEXIUM) 40 MG capsule   Oral   Take 40 mg by mouth 2 (two) times daily.         Marland Kitchen HYDROcodone-acetaminophen (NORCO) 10-325 MG per tablet   Oral   Take 1 tablet by mouth every 6 (six) hours as needed. Pain         . ibuprofen (ADVIL,MOTRIN) 200 MG  tablet   Oral   Take 600-800 mg by mouth every 6 (six) hours as needed. Pain         . levonorgestrel-ethinyl estradiol (SEASONALE,INTROVALE,JOLESSA) 0.15-0.03 MG tablet   Oral   Take 1 tablet by mouth at bedtime.         . Multiple Vitamin (MULTIVITAMIN WITH MINERALS) TABS   Oral   Take 1 tablet by mouth daily.         Marland Kitchen omeprazole (PRILOSEC) 20 MG capsule   Oral   Take 20 mg by mouth 2 (two) times daily as needed (heartburn (uses when she runs out of nexium)).         . topiramate (TOPAMAX) 25 MG tablet   Oral   Take 25 mg by mouth 2 (two) times daily.           BP 137/82  Pulse 78  Temp(Src) 98.2 F (36.8 C) (Oral)  Resp 20  Ht 5\' 4"  (1.626 m)  Wt 263 lb (119.296 kg)  BMI 45.12 kg/m2  SpO2 97%  LMP 06/30/2012  Physical Exam  Nursing note and vitals reviewed. Constitutional: She is oriented to person, place, and time. She appears well-developed and well-nourished. No distress.  HENT:  Head: Normocephalic and atraumatic.  Eyes: EOM are normal. Pupils are equal, round, and reactive to light.  Neck: Normal range of motion. Neck supple. No tracheal deviation present.  Cardiovascular: Normal rate, regular rhythm and normal heart sounds.   No murmur heard. Pulmonary/Chest: Effort normal and breath sounds normal. No respiratory distress. She has no wheezes. She has no rales.  Abdominal: Soft. Bowel sounds are normal. She exhibits no distension. There is no tenderness. There is no rebound and no guarding.  Musculoskeletal: Normal range of motion.  Neurological: She is alert and oriented to person, place, and time. No cranial nerve deficit. Coordination normal.  Skin: Skin is warm and dry.  Psychiatric: She has a normal mood and affect. Her behavior is normal.    ED Course  Procedures (including critical care time) DIAGNOSTIC STUDIES: Oxygen Saturation is 97% on room air, normal by my interpretation.    COORDINATION OF CARE: 1:32 PM Discussed ED treatment  with pt and pt agrees.  Medications  0.9 %  sodium chloride infusion (not administered)  sodium chloride 0.9 % bolus 1,000 mL (1,000 mLs Intravenous New Bag/Given 07/14/12 1415)  ondansetron (ZOFRAN) injection 4 mg (4 mg Intravenous Given 07/14/12 1415)  HYDROmorphone (DILAUDID) injection 1 mg (1 mg Intravenous Given 07/14/12 1415)  metoCLOPramide (REGLAN) injection 10 mg (10 mg Intravenous Given 07/14/12 1415)  diphenhydrAMINE (BENADRYL) injection 12.5 mg (12.5 mg Intravenous Given 07/14/12 1415)     Results for orders placed  during the hospital encounter of 07/14/12  URINALYSIS, ROUTINE W REFLEX MICROSCOPIC      Result Value Range   Color, Urine YELLOW  YELLOW   APPearance CLEAR  CLEAR   Specific Gravity, Urine 1.025  1.005 - 1.030   pH 6.0  5.0 - 8.0   Glucose, UA NEGATIVE  NEGATIVE mg/dL   Hgb urine dipstick NEGATIVE  NEGATIVE   Bilirubin Urine NEGATIVE  NEGATIVE   Ketones, ur NEGATIVE  NEGATIVE mg/dL   Protein, ur NEGATIVE  NEGATIVE mg/dL   Urobilinogen, UA 0.2  0.0 - 1.0 mg/dL   Nitrite NEGATIVE  NEGATIVE   Leukocytes, UA TRACE (*) NEGATIVE  PREGNANCY, URINE      Result Value Range   Preg Test, Ur NEGATIVE  NEGATIVE  CBC WITH DIFFERENTIAL      Result Value Range   WBC 8.8  4.0 - 10.5 K/uL   RBC 3.76 (*) 3.87 - 5.11 MIL/uL   Hemoglobin 11.8 (*) 12.0 - 15.0 g/dL   HCT 40.9 (*) 81.1 - 91.4 %   MCV 92.3  78.0 - 100.0 fL   MCH 31.4  26.0 - 34.0 pg   MCHC 34.0  30.0 - 36.0 g/dL   RDW 78.2  95.6 - 21.3 %   Platelets 279  150 - 400 K/uL   Neutrophils Relative % 56  43 - 77 %   Neutro Abs 4.9  1.7 - 7.7 K/uL   Lymphocytes Relative 31  12 - 46 %   Lymphs Abs 2.7  0.7 - 4.0 K/uL   Monocytes Relative 11  3 - 12 %   Monocytes Absolute 0.9  0.1 - 1.0 K/uL   Eosinophils Relative 2  0 - 5 %   Eosinophils Absolute 0.2  0.0 - 0.7 K/uL   Basophils Relative 0  0 - 1 %   Basophils Absolute 0.0  0.0 - 0.1 K/uL  BASIC METABOLIC PANEL      Result Value Range   Sodium 139  135 - 145 mEq/L    Potassium 3.9  3.5 - 5.1 mEq/L   Chloride 102  96 - 112 mEq/L   CO2 27  19 - 32 mEq/L   Glucose, Bld 108 (*) 70 - 99 mg/dL   BUN 13  6 - 23 mg/dL   Creatinine, Ser 0.86  0.50 - 1.10 mg/dL   Calcium 9.2  8.4 - 57.8 mg/dL   GFR calc non Af Amer >90  >90 mL/min   GFR calc Af Amer >90  >90 mL/min  URINE MICROSCOPIC-ADD ON      Result Value Range   Squamous Epithelial / LPF MANY (*) RARE   WBC, UA 0-2  <3 WBC/hpf   RBC / HPF 0-2  <3 RBC/hpf   Bacteria, UA MANY (*) RARE   3:43 PM Recheck: Discussed lab results and treatment course with pt. Pt is feeling better.Pain is rated at 2/10 currently.  Pt is ready for discharge.    No results found.  Date: 07/14/2012  Rate: 77  Rhythm: normal sinus rhythm  QRS Axis: normal  Intervals: normal  ST/T Wave abnormalities: normal  Conduction Disutrbances:none  Narrative Interpretation:   Old EKG Reviewed: none available    1. Migraine       MDM  Symptoms consistent with typical migraine for this patient. Improved with medications to include hydromorphone Benadryl and Reglan. Patient's headache is now a 2/10 significantly improve she was able to rest. Will discharge home with a work note for  today and tomorrow. Patient has followup with her primary care Dr.  Drucilla Schmidt not feel that the symptoms are related to anything other than a typical migraine for this patient.     I personally performed the services described in this documentation, which was scribed in my presence. The recorded information has been reviewed and is accurate.     Shelda Jakes, MD 07/14/12 [id number]

## 2012-07-14 NOTE — ED Notes (Signed)
Pt gave permission to call mother. Pt mother called and informed that pt being discharge. Pt mother reported she was on her way to pick up pt. Pt aware.

## 2012-09-04 ENCOUNTER — Encounter (HOSPITAL_COMMUNITY): Payer: Self-pay | Admitting: *Deleted

## 2012-09-04 ENCOUNTER — Emergency Department (HOSPITAL_COMMUNITY)
Admission: EM | Admit: 2012-09-04 | Discharge: 2012-09-04 | Disposition: A | Discharge status: Home / Self Care | Payer: Medicaid Other | Attending: Emergency Medicine | Admitting: Emergency Medicine

## 2012-09-04 DIAGNOSIS — L03113 Cellulitis of right upper limb: Secondary | ICD-10-CM

## 2012-09-04 DIAGNOSIS — G43909 Migraine, unspecified, not intractable, without status migrainosus: Secondary | ICD-10-CM | POA: Insufficient documentation

## 2012-09-04 DIAGNOSIS — Y9389 Activity, other specified: Secondary | ICD-10-CM | POA: Insufficient documentation

## 2012-09-04 DIAGNOSIS — Z8679 Personal history of other diseases of the circulatory system: Secondary | ICD-10-CM | POA: Insufficient documentation

## 2012-09-04 DIAGNOSIS — IMO0002 Reserved for concepts with insufficient information to code with codable children: Secondary | ICD-10-CM | POA: Insufficient documentation

## 2012-09-04 DIAGNOSIS — T6391XA Toxic effect of contact with unspecified venomous animal, accidental (unintentional), initial encounter: Secondary | ICD-10-CM | POA: Insufficient documentation

## 2012-09-04 DIAGNOSIS — R21 Rash and other nonspecific skin eruption: Secondary | ICD-10-CM | POA: Insufficient documentation

## 2012-09-04 DIAGNOSIS — G8929 Other chronic pain: Secondary | ICD-10-CM | POA: Insufficient documentation

## 2012-09-04 DIAGNOSIS — T63461A Toxic effect of venom of wasps, accidental (unintentional), initial encounter: Secondary | ICD-10-CM | POA: Insufficient documentation

## 2012-09-04 DIAGNOSIS — F410 Panic disorder [episodic paroxysmal anxiety] without agoraphobia: Secondary | ICD-10-CM | POA: Insufficient documentation

## 2012-09-04 DIAGNOSIS — F329 Major depressive disorder, single episode, unspecified: Secondary | ICD-10-CM | POA: Insufficient documentation

## 2012-09-04 DIAGNOSIS — F3289 Other specified depressive episodes: Secondary | ICD-10-CM | POA: Insufficient documentation

## 2012-09-04 DIAGNOSIS — K219 Gastro-esophageal reflux disease without esophagitis: Secondary | ICD-10-CM | POA: Insufficient documentation

## 2012-09-04 DIAGNOSIS — M549 Dorsalgia, unspecified: Secondary | ICD-10-CM | POA: Insufficient documentation

## 2012-09-04 DIAGNOSIS — F17200 Nicotine dependence, unspecified, uncomplicated: Secondary | ICD-10-CM | POA: Insufficient documentation

## 2012-09-04 DIAGNOSIS — Z8739 Personal history of other diseases of the musculoskeletal system and connective tissue: Secondary | ICD-10-CM | POA: Insufficient documentation

## 2012-09-04 DIAGNOSIS — Z79899 Other long term (current) drug therapy: Secondary | ICD-10-CM | POA: Insufficient documentation

## 2012-09-04 DIAGNOSIS — Y9289 Other specified places as the place of occurrence of the external cause: Secondary | ICD-10-CM | POA: Insufficient documentation

## 2012-09-04 DIAGNOSIS — L299 Pruritus, unspecified: Secondary | ICD-10-CM | POA: Insufficient documentation

## 2012-09-04 HISTORY — DX: Nausea: R11.0

## 2012-09-04 MED ORDER — METOCLOPRAMIDE HCL 5 MG/ML IJ SOLN
10.0000 mg | Freq: Once | INTRAMUSCULAR | Status: AC
  Administered 2012-09-04: 10 mg via INTRAMUSCULAR
  Filled 2012-09-04: qty 2

## 2012-09-04 MED ORDER — CEPHALEXIN 500 MG PO CAPS: 500.0000 mg | ORAL_CAPSULE | Freq: Four times a day (QID) | ORAL | Status: DC

## 2012-09-04 MED ORDER — FENTANYL CITRATE 0.05 MG/ML IJ SOLN
50.0000 ug | Freq: Once | INTRAMUSCULAR | Status: AC
  Administered 2012-09-04: 50 ug via INTRAMUSCULAR
  Filled 2012-09-04: qty 2

## 2012-09-04 MED ORDER — METOCLOPRAMIDE HCL 10 MG PO TABS: 10.0000 mg | ORAL_TABLET | Freq: Four times a day (QID) | ORAL | Status: DC | PRN

## 2012-09-04 MED ORDER — SULFAMETHOXAZOLE-TRIMETHOPRIM 800-160 MG PO TABS: 1.0000 | ORAL_TABLET | Freq: Two times a day (BID) | ORAL | Status: DC

## 2012-09-04 MED ORDER — DIPHENHYDRAMINE HCL 50 MG/ML IJ SOLN
50.0000 mg | Freq: Once | INTRAMUSCULAR | Status: AC
  Administered 2012-09-04: 50 mg via INTRAMUSCULAR
  Filled 2012-09-04: qty 1

## 2012-09-04 NOTE — ED Provider Notes (Signed)
CSN: [id number]     Arrival date & time 09/04/12  0134 History     First MD Initiated Contact with Patient 09/04/12 0157     Chief Complaint  Patient presents with  . Insect Bite    HPI Pt was seen at 0210. Per pt, c/o gradual onset and persistence of constant acute flair of her chronic migraine headache since last night.  Describes the headache as per her usual chronic migraine headache pain pattern for the past several years.  Denies headache was sudden or maximal in onset or at any time.  Denies visual changes, no focal motor weakness, no tingling/numbness in extremities, no fevers, no neck pain. Pt also c/o gradual onset and persistence of constant "red itchy rash" on her right volar forearm that began after she was "stung by a hornet" 2 days ago. Denies diffuse rash, no dysphagia, no hoarse voice, no drooling/stridor, no wheezing/SOB, no open wounds, no drainage.       Past Medical History  Diagnosis Date  . Panic disorder without agoraphobia   . Esophageal reflux   . Asymptomatic varicose veins   . Ingrowing nail   . Depression   . Chronic back pain   . Abnormal pap   . DDD (degenerative disc disease)   . Migraines   . Chronic nausea    Past Surgical History  Procedure Laterality Date  . Tonsillectomy    . Esophagogastroduodenoscopy  06/11/2011     SMALL HIATAL HERNIA/Moderate gastritis/  BURNING CHESTPAIN: GERD, NERD, OR NON-ULCER DYSPEPSIA/ Nl esophagus   Family History  Problem Relation Age of Onset  . Pancreatic cancer Father 66   History  Substance Use Topics  . Smoking status: Current Some Day Smoker    Types: Cigarettes  . Smokeless tobacco: Not on file     Comment: pt reports '4 cigarettes per day."  . Alcohol Use: Yes     Comment: occasionally    Review of Systems ROS: Statement: All systems negative except as marked or noted in the HPI; Constitutional: Negative for fever and chills. ; ; Eyes: Negative for eye pain, redness and discharge. ; ; ENMT:  Negative for ear pain, hoarseness, nasal congestion, sinus pressure and sore throat. ; ; Cardiovascular: Negative for chest pain, palpitations, diaphoresis, dyspnea and peripheral edema. ; ; Respiratory: Negative for cough, wheezing and stridor. ; ; Gastrointestinal: Negative for nausea, vomiting, diarrhea, abdominal pain, blood in stool, hematemesis, jaundice and rectal bleeding. . ; ; Genitourinary: Negative for dysuria, flank pain and hematuria. ; ; Musculoskeletal: Negative for back pain and neck pain. Negative for swelling and trauma.; ; Skin: +itching rash. Negative for abrasions, blisters, bruising and skin lesion.; ; Neuro: +chronic headache. Negative for lightheadedness and neck stiffness. Negative for weakness, altered level of consciousness , altered mental status, extremity weakness, paresthesias, involuntary movement, seizure and syncope.       Allergies  Review of patient's allergies indicates no known allergies.  Home Medications   Current Outpatient Rx  Name  Route  Sig  Dispense  Refill  . ALPRAZolam (XANAX) 0.5 MG tablet   Oral   Take 0.5 mg by mouth 2 (two) times daily as needed for sleep or anxiety.         . baclofen (LIORESAL) 10 MG tablet   Oral   Take 1 tablet (10 mg total) by mouth 3 (three) times daily before meals.   93 tablet   11   . escitalopram (LEXAPRO) 20 MG tablet   Oral  Take 20 mg by mouth daily.         Marland Kitchen esomeprazole (NEXIUM) 40 MG capsule   Oral   Take 40 mg by mouth 2 (two) times daily.         Marland Kitchen HYDROcodone-acetaminophen (NORCO) 10-325 MG per tablet   Oral   Take 1 tablet by mouth every 6 (six) hours as needed. Pain         . ibuprofen (ADVIL,MOTRIN) 200 MG tablet   Oral   Take 600-800 mg by mouth every 6 (six) hours as needed. Pain         . levonorgestrel-ethinyl estradiol (SEASONALE,INTROVALE,JOLESSA) 0.15-0.03 MG tablet   Oral   Take 1 tablet by mouth at bedtime.         . Multiple Vitamin (MULTIVITAMIN WITH MINERALS)  TABS   Oral   Take 1 tablet by mouth daily.         Marland Kitchen omeprazole (PRILOSEC) 20 MG capsule   Oral   Take 20 mg by mouth 2 (two) times daily as needed (heartburn (uses when she runs out of nexium)).         . topiramate (TOPAMAX) 25 MG tablet   Oral   Take 25 mg by mouth 2 (two) times daily.         . cephALEXin (KEFLEX) 500 MG capsule   Oral   Take 1 capsule (500 mg total) by mouth 4 (four) times daily.   28 capsule   0   . metoCLOPramide (REGLAN) 10 MG tablet   Oral   Take 1 tablet (10 mg total) by mouth every 6 (six) hours as needed (for headache or nausea).   6 tablet   0   . sulfamethoxazole-trimethoprim (BACTRIM DS) 800-160 MG per tablet   Oral   Take 1 tablet by mouth 2 (two) times daily.   14 tablet   0    BP 137/77  Pulse 65  Temp(Src) 98 F (36.7 C) (Oral)  Resp 20  Ht 5\' 5"  (1.651 m)  Wt 253 lb (114.76 kg)  BMI 42.1 kg/m2  SpO2 96%  LMP 08/27/2012 Physical Exam 0215: Physical examination:  Nursing notes reviewed; Vital signs and O2 SAT reviewed;  Constitutional: Well developed, Well nourished, Well hydrated, In no acute distress; Head:  Normocephalic, atraumatic; Eyes: EOMI, PERRL, No scleral icterus; ENMT: TM's clear bilat. +edemetous nasal turbinates bilat with clear rhinorrhea. Mouth and pharynx without lesions. No tonsillar exudates. No intra-oral edema. No submandibular or sublingual edema. No hoarse voice, no drooling, no stridor. No pain with manipulation of larynx. Mouth and pharynx normal, Mucous membranes moist; Neck: Supple, Full range of motion, No lymphadenopathy; Cardiovascular: Regular rate and rhythm, No murmur, rub, or gallop; Respiratory: Breath sounds clear & equal bilaterally, No rales, rhonchi, wheezes.  Speaking full sentences with ease, Normal respiratory effort/excursion; Chest: Nontender, Movement normal; Abdomen: Soft, Nontender, Nondistended, Normal bowel sounds; Genitourinary: No CVA tenderness; Extremities: Pulses normal, No  tenderness, +small localized area of induration, erythema and edema to right mid-volar forearm. No fluctuance, no open wounds, no ecchymosis, no streaking. No calf edema or asymmetry.; Neuro: AA&Ox3, Major CN grossly intact.  Speech clear. Climbs on and off stretcher easily by herself. Gait steady. No gross focal motor or sensory deficits in extremities.; Skin: Color normal, Warm, Dry. No diffuse rash.   ED Course   Procedures   MDM  MDM Reviewed: previous chart, nursing note and vitals   0220:  Will tx right forearm cellulitis with PO abx.  No signs of abscess at this time. Long hx of chronic headache pain with multiple ED visits for same.  Pt endorses acute flair of her usual long standing chronic pain today, no change from her usual chronic pain pattern. Will tx headache symptomatically at this time. Requesting narcotic pain meds for her headache "because that's the only thing that works."  Long d/w pt and her mother regarding inappropriateness of same, as it is not a literature based recommended treatment for chronic headaches. Verb understanding but insistent she needs "a shot" of narcotic pain med. Pt encouraged to f/u with her PMD for good continuity of care and control of her chronic pain.  Verb understanding.    Laray Anger, DO 09/05/12 1825

## 2012-09-04 NOTE — ED Notes (Signed)
Pt states was stung by a [place] hornet 2 days ago. Pt has red area to the right forearm. Pt also states she thinks she is getting a migraine.

## 2012-09-04 NOTE — ED Notes (Signed)
Pt alert & oriented x4, stable gait. Patient given discharge instructions, paperwork & prescription(s). Patient  instructed to stop at the registration desk to finish any additional paperwork. Patient verbalized understanding. Pt left department w/ no further questions. 

## 2012-09-28 ENCOUNTER — Emergency Department (HOSPITAL_COMMUNITY)
Admission: EM | Admit: 2012-09-28 | Discharge: 2012-09-29 | Disposition: A | Discharge status: Home / Self Care | Payer: Medicaid Other | Attending: Emergency Medicine | Admitting: Emergency Medicine

## 2012-09-28 ENCOUNTER — Encounter (HOSPITAL_COMMUNITY): Payer: Self-pay | Admitting: *Deleted

## 2012-09-28 DIAGNOSIS — Z3202 Encounter for pregnancy test, result negative: Secondary | ICD-10-CM | POA: Insufficient documentation

## 2012-09-28 DIAGNOSIS — F17200 Nicotine dependence, unspecified, uncomplicated: Secondary | ICD-10-CM | POA: Insufficient documentation

## 2012-09-28 DIAGNOSIS — M549 Dorsalgia, unspecified: Secondary | ICD-10-CM | POA: Insufficient documentation

## 2012-09-28 DIAGNOSIS — G43909 Migraine, unspecified, not intractable, without status migrainosus: Secondary | ICD-10-CM | POA: Insufficient documentation

## 2012-09-28 DIAGNOSIS — K219 Gastro-esophageal reflux disease without esophagitis: Secondary | ICD-10-CM | POA: Insufficient documentation

## 2012-09-28 DIAGNOSIS — F410 Panic disorder [episodic paroxysmal anxiety] without agoraphobia: Secondary | ICD-10-CM | POA: Insufficient documentation

## 2012-09-28 DIAGNOSIS — R61 Generalized hyperhidrosis: Secondary | ICD-10-CM | POA: Insufficient documentation

## 2012-09-28 DIAGNOSIS — F329 Major depressive disorder, single episode, unspecified: Secondary | ICD-10-CM | POA: Insufficient documentation

## 2012-09-28 DIAGNOSIS — F3289 Other specified depressive episodes: Secondary | ICD-10-CM | POA: Insufficient documentation

## 2012-09-28 DIAGNOSIS — Z8679 Personal history of other diseases of the circulatory system: Secondary | ICD-10-CM | POA: Insufficient documentation

## 2012-09-28 DIAGNOSIS — N201 Calculus of ureter: Secondary | ICD-10-CM | POA: Insufficient documentation

## 2012-09-28 DIAGNOSIS — Z872 Personal history of diseases of the skin and subcutaneous tissue: Secondary | ICD-10-CM | POA: Insufficient documentation

## 2012-09-28 DIAGNOSIS — Z79899 Other long term (current) drug therapy: Secondary | ICD-10-CM | POA: Insufficient documentation

## 2012-09-28 DIAGNOSIS — G8929 Other chronic pain: Secondary | ICD-10-CM | POA: Insufficient documentation

## 2012-09-28 LAB — URINALYSIS, ROUTINE W REFLEX MICROSCOPIC
Glucose, UA: NEGATIVE mg/dL
Leukocytes, UA: NEGATIVE
Specific Gravity, Urine: >1.03 — ABNORMAL HIGH (ref 1.005–1.030)
pH: 6 (ref 5.0–8.0)

## 2012-09-28 LAB — URINE MICROSCOPIC-ADD ON

## 2012-09-28 LAB — CBC WITH DIFFERENTIAL/PLATELET
Basophils Absolute: 0 10*3/uL (ref 0.0–0.1)
HCT: 38.4 % (ref 36.0–46.0)
Lymphocytes Relative: 34 % (ref 12–46)
Monocytes Absolute: 1.1 10*3/uL — ABNORMAL HIGH (ref 0.1–1.0)
Neutro Abs: 5.9 10*3/uL (ref 1.7–7.7)
RDW: 13.1 % (ref 11.5–15.5)
WBC: 10.8 10*3/uL — ABNORMAL HIGH (ref 4.0–10.5)

## 2012-09-28 LAB — BASIC METABOLIC PANEL
CO2: 24 mEq/L (ref 19–32)
Chloride: 104 mEq/L (ref 96–112)
Creatinine, Ser: 1.04 mg/dL (ref 0.50–1.10)
Potassium: 3.7 mEq/L (ref 3.5–5.1)
Sodium: 141 mEq/L (ref 135–145)

## 2012-09-28 LAB — POCT PREGNANCY, URINE: Preg Test, Ur: NEGATIVE

## 2012-09-28 NOTE — ED Notes (Signed)
Pain RLQ, nausea, no vomiting , no diarrhea.  Last bm today- normal.

## 2012-09-29 ENCOUNTER — Emergency Department (HOSPITAL_COMMUNITY): Payer: Medicaid Other

## 2012-09-29 LAB — HEPATIC FUNCTION PANEL
ALT: 11 U/L (ref 0–35)
AST: 14 U/L (ref 0–37)
Bilirubin, Direct: <0.1 mg/dL (ref 0.0–0.3)
Total Bilirubin: 0.2 mg/dL — ABNORMAL LOW (ref 0.3–1.2)

## 2012-09-29 MED ORDER — IOHEXOL 300 MG/ML  SOLN
50.0000 mL | Freq: Once | INTRAMUSCULAR | Status: AC | PRN
  Administered 2012-09-29: 50 mL via ORAL

## 2012-09-29 MED ORDER — HYDROMORPHONE HCL PF 1 MG/ML IJ SOLN
INTRAMUSCULAR | Status: AC
  Administered 2012-09-29: 1 mg
  Filled 2012-09-29: qty 1

## 2012-09-29 MED ORDER — SODIUM CHLORIDE 0.9 % IV SOLN
INTRAVENOUS | Status: DC
  Administered 2012-09-29: 02:00:00 via INTRAVENOUS

## 2012-09-29 MED ORDER — HYDROMORPHONE HCL PF 1 MG/ML IJ SOLN
1.0000 mg | Freq: Once | INTRAMUSCULAR | Status: AC
  Administered 2012-09-29: 1 mg via INTRAVENOUS
  Filled 2012-09-29: qty 1

## 2012-09-29 MED ORDER — IOHEXOL 300 MG/ML  SOLN
100.0000 mL | Freq: Once | INTRAMUSCULAR | Status: AC | PRN
  Administered 2012-09-29: 100 mL via INTRAVENOUS

## 2012-09-29 MED ORDER — ONDANSETRON HCL 4 MG/2ML IJ SOLN
4.0000 mg | Freq: Once | INTRAMUSCULAR | Status: AC
  Administered 2012-09-29: 4 mg via INTRAVENOUS
  Filled 2012-09-29: qty 2

## 2012-09-29 MED ORDER — SODIUM CHLORIDE 0.9 % IV BOLUS (SEPSIS)
1000.0000 mL | Freq: Once | INTRAVENOUS | Status: AC
  Administered 2012-09-29: 1000 mL via INTRAVENOUS

## 2012-10-03 NOTE — ED Provider Notes (Signed)
CSN: [id number]     Arrival date & time 09/28/12  2237 History   First MD Initiated Contact with Patient 09/28/12 2310     Chief Complaint  Patient presents with  . Abdominal Pain   (Consider location/radiation/quality/duration/timing/severity/associated sxs/prior Treatment) Patient is a 27 y.o. female presenting with abdominal pain. The history is provided by the patient.  Abdominal Pain Associated symptoms: hematuria   Associated symptoms: no chest pain, no diarrhea, no dysuria, no fever, no nausea, no shortness of breath and no vomiting    patient with onset of right sided abdominal pain at about 9 this morning. Got bad at 7:30 this evening. Sharp and crampy in nature 10 out of 10 does radiate to the back. Last menstrual period is now. Denies dysuria fever. No history of similar pain. Pain is not made worse or better by anything. Past Medical History  Diagnosis Date  . Panic disorder without agoraphobia   . Esophageal reflux   . Asymptomatic varicose veins   . Ingrowing nail   . Depression   . Chronic back pain   . Abnormal pap   . DDD (degenerative disc disease)   . Migraines   . Chronic nausea    Past Surgical History  Procedure Laterality Date  . Tonsillectomy    . Esophagogastroduodenoscopy  06/11/2011     SMALL HIATAL HERNIA/Moderate gastritis/  BURNING CHESTPAIN: GERD, NERD, OR NON-ULCER DYSPEPSIA/ Nl esophagus   Family History  Problem Relation Age of Onset  . Pancreatic cancer Father 53   History  Substance Use Topics  . Smoking status: Current Some Day Smoker    Types: Cigarettes  . Smokeless tobacco: Not on file     Comment: pt reports '4 cigarettes per day."  . Alcohol Use: Yes     Comment: occasionally   OB History   Grav Para Term Preterm Abortions TAB SAB Ect Mult Living                 Review of Systems  Constitutional: Positive for diaphoresis. Negative for fever.  HENT: Negative for congestion.   Eyes: Negative for redness.  Respiratory:  Negative for shortness of breath.   Cardiovascular: Negative for chest pain.  Gastrointestinal: Positive for abdominal pain. Negative for nausea, vomiting and diarrhea.  Genitourinary: Positive for hematuria. Negative for dysuria.  Musculoskeletal: Positive for back pain.  Neurological: Negative for headaches.  Hematological: Does not bruise/bleed easily.  Psychiatric/Behavioral: Negative for confusion.    Allergies  Review of patient's allergies indicates no known allergies.  Home Medications   Current Outpatient Rx  Name  Route  Sig  Dispense  Refill  . ALPRAZolam (XANAX) 0.5 MG tablet   Oral   Take 0.5 mg by mouth 2 (two) times daily as needed for sleep or anxiety.         . baclofen (LIORESAL) 10 MG tablet   Oral   Take 1 tablet (10 mg total) by mouth 3 (three) times daily before meals.   93 tablet   11   . cephALEXin (KEFLEX) 500 MG capsule   Oral   Take 1 capsule (500 mg total) by mouth 4 (four) times daily.   28 capsule   0   . escitalopram (LEXAPRO) 20 MG tablet   Oral   Take 20 mg by mouth daily.         Marland Kitchen esomeprazole (NEXIUM) 40 MG capsule   Oral   Take 40 mg by mouth 2 (two) times daily.         Marland Kitchen  HYDROcodone-acetaminophen (NORCO) 10-325 MG per tablet   Oral   Take 1 tablet by mouth every 6 (six) hours as needed. Pain         . ibuprofen (ADVIL,MOTRIN) 200 MG tablet   Oral   Take 600-800 mg by mouth every 6 (six) hours as needed. Pain         . levonorgestrel-ethinyl estradiol (SEASONALE,INTROVALE,JOLESSA) 0.15-0.03 MG tablet   Oral   Take 1 tablet by mouth at bedtime.         . metoCLOPramide (REGLAN) 10 MG tablet   Oral   Take 1 tablet (10 mg total) by mouth every 6 (six) hours as needed (for headache or nausea).   6 tablet   0   . Multiple Vitamin (MULTIVITAMIN WITH MINERALS) TABS   Oral   Take 1 tablet by mouth daily.         Marland Kitchen omeprazole (PRILOSEC) 20 MG capsule   Oral   Take 20 mg by mouth 2 (two) times daily as needed  (heartburn (uses when she runs out of nexium)).         Marland Kitchen sulfamethoxazole-trimethoprim (BACTRIM DS) 800-160 MG per tablet   Oral   Take 1 tablet by mouth 2 (two) times daily.   14 tablet   0   . topiramate (TOPAMAX) 25 MG tablet   Oral   Take 25 mg by mouth 2 (two) times daily.          BP 117/81  Pulse 61  Temp(Src) 98.2 F (36.8 C) (Oral)  Resp 20  Ht 5\' 5"  (1.651 m)  Wt 253 lb (114.76 kg)  BMI 42.1 kg/m2  SpO2 100%  LMP 09/26/2012 Physical Exam  Nursing note and vitals reviewed. Constitutional: She is oriented to person, place, and time. She appears well-developed and well-nourished. She appears distressed.  HENT:  Head: Normocephalic and atraumatic.  Mouth/Throat: Oropharynx is clear and moist.  Eyes: Conjunctivae and EOM are normal. Pupils are equal, round, and reactive to light.  Neck: Normal range of motion.  Cardiovascular: Normal rate and regular rhythm.   No murmur heard. Pulmonary/Chest: Effort normal and breath sounds normal.  Abdominal: Soft. Bowel sounds are normal. There is no tenderness.  Neurological: She is alert and oriented to person, place, and time. No cranial nerve deficit. She exhibits normal muscle tone. Coordination normal.  Skin: Skin is warm. No rash noted.    ED Course  Procedures (including critical care time) Labs Review Labs Reviewed  CBC WITH DIFFERENTIAL - Abnormal; Notable for the following:    WBC 10.8 (*)    Monocytes Absolute 1.1 (*)    All other components within normal limits  BASIC METABOLIC PANEL - Abnormal; Notable for the following:    GFR calc non Af Amer 73 (*)    GFR calc Af Amer 85 (*)    All other components within normal limits  URINALYSIS, ROUTINE W REFLEX MICROSCOPIC - Abnormal; Notable for the following:    Specific Gravity, Urine >1.030 (*)    Hgb urine dipstick MODERATE (*)    All other components within normal limits  URINE MICROSCOPIC-ADD ON - Abnormal; Notable for the following:    Squamous  Epithelial / LPF MANY (*)    Bacteria, UA MANY (*)    Casts HYALINE CASTS (*)    All other components within normal limits  HEPATIC FUNCTION PANEL - Abnormal; Notable for the following:    Total Bilirubin 0.2 (*)    All other components within normal  limits  LIPASE, BLOOD  POCT PREGNANCY, URINE   Results for orders placed during the hospital encounter of 09/28/12  CBC WITH DIFFERENTIAL      Result Value Range   WBC 10.8 (*) 4.0 - 10.5 K/uL   RBC 4.21  3.87 - 5.11 MIL/uL   Hemoglobin 13.1  12.0 - 15.0 g/dL   HCT 16.1  09.6 - 04.5 %   MCV 91.2  78.0 - 100.0 fL   MCH 31.1  26.0 - 34.0 pg   MCHC 34.1  30.0 - 36.0 g/dL   RDW 40.9  81.1 - 91.4 %   Platelets 335  150 - 400 K/uL   Neutrophils Relative % 55  43 - 77 %   Neutro Abs 5.9  1.7 - 7.7 K/uL   Lymphocytes Relative 34  12 - 46 %   Lymphs Abs 3.7  0.7 - 4.0 K/uL   Monocytes Relative 10  3 - 12 %   Monocytes Absolute 1.1 (*) 0.1 - 1.0 K/uL   Eosinophils Relative 1  0 - 5 %   Eosinophils Absolute 0.1  0.0 - 0.7 K/uL   Basophils Relative 0  0 - 1 %   Basophils Absolute 0.0  0.0 - 0.1 K/uL  BASIC METABOLIC PANEL      Result Value Range   Sodium 141  135 - 145 mEq/L   Potassium 3.7  3.5 - 5.1 mEq/L   Chloride 104  96 - 112 mEq/L   CO2 24  19 - 32 mEq/L   Glucose, Bld 97  70 - 99 mg/dL   BUN 17  6 - 23 mg/dL   Creatinine, Ser 7.82  0.50 - 1.10 mg/dL   Calcium 9.8  8.4 - 95.6 mg/dL   GFR calc non Af Amer 73 (*) >90 mL/min   GFR calc Af Amer 85 (*) >90 mL/min  URINALYSIS, ROUTINE W REFLEX MICROSCOPIC      Result Value Range   Color, Urine YELLOW  YELLOW   APPearance CLEAR  CLEAR   Specific Gravity, Urine >1.030 (*) 1.005 - 1.030   pH 6.0  5.0 - 8.0   Glucose, UA NEGATIVE  NEGATIVE mg/dL   Hgb urine dipstick MODERATE (*) NEGATIVE   Bilirubin Urine NEGATIVE  NEGATIVE   Ketones, ur NEGATIVE  NEGATIVE mg/dL   Protein, ur NEGATIVE  NEGATIVE mg/dL   Urobilinogen, UA 0.2  0.0 - 1.0 mg/dL   Nitrite NEGATIVE  NEGATIVE    Leukocytes, UA NEGATIVE  NEGATIVE  URINE MICROSCOPIC-ADD ON      Result Value Range   Squamous Epithelial / LPF MANY (*) RARE   WBC, UA 0-2  <3 WBC/hpf   RBC / HPF 7-10  <3 RBC/hpf   Bacteria, UA MANY (*) RARE   Casts HYALINE CASTS (*) NEGATIVE   Urine-Other MUCOUS PRESENT    LIPASE, BLOOD      Result Value Range   Lipase 36  11 - 59 U/L  HEPATIC FUNCTION PANEL      Result Value Range   Total Protein 8.0  6.0 - 8.3 g/dL   Albumin 4.3  3.5 - 5.2 g/dL   AST 14  0 - 37 U/L   ALT 11  0 - 35 U/L   Alkaline Phosphatase 40  39 - 117 U/L   Total Bilirubin 0.2 (*) 0.3 - 1.2 mg/dL   Bilirubin, Direct <2.1  0.0 - 0.3 mg/dL   Indirect Bilirubin NOT CALCULATED  0.3 - 0.9 mg/dL  POCT  PREGNANCY, URINE      Result Value Range   Preg Test, Ur NEGATIVE  NEGATIVE   Results for orders placed during the hospital encounter of 09/28/12  CBC WITH DIFFERENTIAL      Result Value Range   WBC 10.8 (*) 4.0 - 10.5 K/uL   RBC 4.21  3.87 - 5.11 MIL/uL   Hemoglobin 13.1  12.0 - 15.0 g/dL   HCT 81.1  91.4 - 78.2 %   MCV 91.2  78.0 - 100.0 fL   MCH 31.1  26.0 - 34.0 pg   MCHC 34.1  30.0 - 36.0 g/dL   RDW 95.6  21.3 - 08.6 %   Platelets 335  150 - 400 K/uL   Neutrophils Relative % 55  43 - 77 %   Neutro Abs 5.9  1.7 - 7.7 K/uL   Lymphocytes Relative 34  12 - 46 %   Lymphs Abs 3.7  0.7 - 4.0 K/uL   Monocytes Relative 10  3 - 12 %   Monocytes Absolute 1.1 (*) 0.1 - 1.0 K/uL   Eosinophils Relative 1  0 - 5 %   Eosinophils Absolute 0.1  0.0 - 0.7 K/uL   Basophils Relative 0  0 - 1 %   Basophils Absolute 0.0  0.0 - 0.1 K/uL  BASIC METABOLIC PANEL      Result Value Range   Sodium 141  135 - 145 mEq/L   Potassium 3.7  3.5 - 5.1 mEq/L   Chloride 104  96 - 112 mEq/L   CO2 24  19 - 32 mEq/L   Glucose, Bld 97  70 - 99 mg/dL   BUN 17  6 - 23 mg/dL   Creatinine, Ser 5.78  0.50 - 1.10 mg/dL   Calcium 9.8  8.4 - 46.9 mg/dL   GFR calc non Af Amer 73 (*) >90 mL/min   GFR calc Af Amer 85 (*) >90 mL/min   URINALYSIS, ROUTINE W REFLEX MICROSCOPIC      Result Value Range   Color, Urine YELLOW  YELLOW   APPearance CLEAR  CLEAR   Specific Gravity, Urine >1.030 (*) 1.005 - 1.030   pH 6.0  5.0 - 8.0   Glucose, UA NEGATIVE  NEGATIVE mg/dL   Hgb urine dipstick MODERATE (*) NEGATIVE   Bilirubin Urine NEGATIVE  NEGATIVE   Ketones, ur NEGATIVE  NEGATIVE mg/dL   Protein, ur NEGATIVE  NEGATIVE mg/dL   Urobilinogen, UA 0.2  0.0 - 1.0 mg/dL   Nitrite NEGATIVE  NEGATIVE   Leukocytes, UA NEGATIVE  NEGATIVE  URINE MICROSCOPIC-ADD ON      Result Value Range   Squamous Epithelial / LPF MANY (*) RARE   WBC, UA 0-2  <3 WBC/hpf   RBC / HPF 7-10  <3 RBC/hpf   Bacteria, UA MANY (*) RARE   Casts HYALINE CASTS (*) NEGATIVE   Urine-Other MUCOUS PRESENT    LIPASE, BLOOD      Result Value Range   Lipase 36  11 - 59 U/L  HEPATIC FUNCTION PANEL      Result Value Range   Total Protein 8.0  6.0 - 8.3 g/dL   Albumin 4.3  3.5 - 5.2 g/dL   AST 14  0 - 37 U/L   ALT 11  0 - 35 U/L   Alkaline Phosphatase 40  39 - 117 U/L   Total Bilirubin 0.2 (*) 0.3 - 1.2 mg/dL   Bilirubin, Direct <6.2  0.0 - 0.3 mg/dL   Indirect  Bilirubin NOT CALCULATED  0.3 - 0.9 mg/dL  POCT PREGNANCY, URINE      Result Value Range   Preg Test, Ur NEGATIVE  NEGATIVE   Ct Abdomen Pelvis W Contrast  09/29/2012   *RADIOLOGY REPORT*  Clinical Data: Right lower quadrant pain  CT ABDOMEN AND PELVIS WITH CONTRAST  Technique:  Multidetector CT imaging of the abdomen and pelvis was performed following the standard protocol during bolus administration of intravenous contrast.  Contrast: 50mL OMNIPAQUE IOHEXOL 300 MG/ML  SOLN, OMNIPAQUE IOHEXOL 300 MG/ML  SOLN  Comparison: None.  Findings: The visualized lung bases are clear.  The liver, gallbladder, spleen, adrenal glands, and pancreas are normal.  Two 3-4 mm nonobstructive stones are present within the lower pole right kidney.  There is mild right-sided hydronephrosis and hydroureter.  No definite  obstructive stone is seen along the course of the right renal collecting system, however, a small 2 mm punctate calcification is seen layering dependently within the bladder, which may represent a recently passed stone (series 2, image 83).  There is no evidence of bowel obstruction.  The appendix is well visualized in the right lower quadrant and is of normal caliber without associate inflammatory changes to suggest acute appendicitis.  No abnormal wall thickening or enhancement is seen about the bowels to suggest underlying infection/inflammation.  The bladder is normal.  The uterus and ovaries are within normal limits.  No free air or fluid is identified.  There is no pathologically enlarged intra-abdominal or pelvic lymph node.  Osseous structures are within normal limits.  Normal intravascular enhancement is seen throughout the abdomen pelvis.  IMPRESSION:  1.  Mild right hydronephrosis and hydroureter.  2 mm calcification within the mid aspect of the bladder lumen may represent a recently passed stone. 2. 3- 4 mm nonobstructive stones within the lower pole right kidney. 3.  Normal appendix.   Original Report Authenticated By: Rise Mu, M.D.      Imaging Review No results found.  MDM   1. Right ureteral stone    Patient improved in the emergency department. Based on CT scan appears that she passed a 2 mm stone into the bladder. Symptoms should be improving and they are. No evidence urinary tract infection. Followup with primary care Dr. and/or urology provided. Please see test is negative as stated no evidence urinary tract infection renal function is normal mild leukocytosis at 10.8. Patient significantly improved. No other significant findings on CT scan other than the lower pole right kidney stones.   Shelda Jakes, MD 10/03/12 [phone]

## 2012-12-03 ENCOUNTER — Other Ambulatory Visit: Payer: Self-pay

## 2012-12-31 ENCOUNTER — Ambulatory Visit: Payer: Self-pay | Admitting: Gastroenterology

## 2012-12-31 ENCOUNTER — Telehealth: Payer: Self-pay | Admitting: Gastroenterology

## 2012-12-31 NOTE — Telephone Encounter (Signed)
No Show X 2 since 09/2011. No appointments completed since EGD in 05/2011.

## 2012-12-31 NOTE — Telephone Encounter (Signed)
No show

## 2013-01-05 ENCOUNTER — Encounter (HOSPITAL_COMMUNITY): Payer: Self-pay | Admitting: Emergency Medicine

## 2013-01-05 ENCOUNTER — Emergency Department (HOSPITAL_COMMUNITY)
Admission: EM | Admit: 2013-01-05 | Discharge: 2013-01-05 | Disposition: A | Discharge status: Home / Self Care | Payer: Medicaid Other | Attending: Emergency Medicine | Admitting: Emergency Medicine

## 2013-01-05 DIAGNOSIS — Z87891 Personal history of nicotine dependence: Secondary | ICD-10-CM | POA: Insufficient documentation

## 2013-01-05 DIAGNOSIS — F329 Major depressive disorder, single episode, unspecified: Secondary | ICD-10-CM | POA: Insufficient documentation

## 2013-01-05 DIAGNOSIS — F3289 Other specified depressive episodes: Secondary | ICD-10-CM | POA: Insufficient documentation

## 2013-01-05 DIAGNOSIS — Z872 Personal history of diseases of the skin and subcutaneous tissue: Secondary | ICD-10-CM | POA: Insufficient documentation

## 2013-01-05 DIAGNOSIS — G8929 Other chronic pain: Secondary | ICD-10-CM | POA: Insufficient documentation

## 2013-01-05 DIAGNOSIS — Z79899 Other long term (current) drug therapy: Secondary | ICD-10-CM | POA: Insufficient documentation

## 2013-01-05 DIAGNOSIS — F410 Panic disorder [episodic paroxysmal anxiety] without agoraphobia: Secondary | ICD-10-CM | POA: Insufficient documentation

## 2013-01-05 DIAGNOSIS — K219 Gastro-esophageal reflux disease without esophagitis: Secondary | ICD-10-CM | POA: Insufficient documentation

## 2013-01-05 DIAGNOSIS — Z792 Long term (current) use of antibiotics: Secondary | ICD-10-CM | POA: Insufficient documentation

## 2013-01-05 DIAGNOSIS — Z8739 Personal history of other diseases of the musculoskeletal system and connective tissue: Secondary | ICD-10-CM | POA: Insufficient documentation

## 2013-01-05 DIAGNOSIS — G43909 Migraine, unspecified, not intractable, without status migrainosus: Secondary | ICD-10-CM | POA: Insufficient documentation

## 2013-01-05 MED ORDER — METHYLPREDNISOLONE SODIUM SUCC 125 MG IJ SOLR
125.0000 mg | Freq: Once | INTRAMUSCULAR | Status: AC
  Administered 2013-01-05: 125 mg via INTRAVENOUS
  Filled 2013-01-05: qty 2

## 2013-01-05 MED ORDER — DIPHENHYDRAMINE HCL 50 MG/ML IJ SOLN
25.0000 mg | Freq: Once | INTRAMUSCULAR | Status: AC
  Administered 2013-01-05: 25 mg via INTRAVENOUS
  Filled 2013-01-05: qty 1

## 2013-01-05 MED ORDER — SODIUM CHLORIDE 0.9 % IV BOLUS (SEPSIS)
1000.0000 mL | Freq: Once | INTRAVENOUS | Status: AC
  Administered 2013-01-05: 1000 mL via INTRAVENOUS

## 2013-01-05 MED ORDER — PROCHLORPERAZINE EDISYLATE 5 MG/ML IJ SOLN
10.0000 mg | Freq: Once | INTRAMUSCULAR | Status: AC
  Administered 2013-01-05: 10 mg via INTRAVENOUS
  Filled 2013-01-05: qty 2

## 2013-01-05 NOTE — ED Notes (Signed)
Patient states that her migraine is completely gone and patient rates pain as 0 out of 10. Patient in NAD. Will continue to monitor patient.

## 2013-01-05 NOTE — ED Notes (Signed)
Pt called back requesting referral to [facility].  Spoke with Dr. Rosalia Hammers, ok to give referral.

## 2013-01-05 NOTE — ED Provider Notes (Signed)
CSN: [id number]     Arrival date & time 01/05/13  6644 History  This chart was scribed for Hilario Quarry, MD by Ronal Fear, ED Scribe. This patient was seen in room APA09/APA09 and the patient's care was started at 8:17 AM.    Chief Complaint  Patient presents with  . Migraine   (Consider location/radiation/quality/duration/timing/severity/associated sxs/prior Treatment) Patient is a 27 y.o. female presenting with migraines. The history is provided by the patient. No language interpreter was used.  Migraine This is a chronic problem. The current episode started 2 days ago. The problem occurs every several days. The problem has been gradually worsening. Associated symptoms include headaches. Pertinent negatives include no chest pain and no shortness of breath.    HPI Comments: ANADELIA KINTZ is a 27 y.o. female who presents to the Emergency Department complaining of gradual onset gradually worsenein pounding blateral temple 9/10 migraine headache for 2x days. She has nausea. Pt has taken 25 mg of topamax 2x a day to prevent migraine and has taken 3 hydrocodone as needed she also took advil, excedrin, claritin and phenergan  with no relief. Pt states that her HA's resolved after recieving a shot from a HA specialist, but the HA's have returned over the past year. Pt has regular periods, her LNMP was normal. She has 1 child and uses seasonal Jolessa.  Pt has seen a HA specialist.  PCP: Dr. Margo Aye  Past Medical History  Diagnosis Date  . Panic disorder without agoraphobia   . Esophageal reflux   . Asymptomatic varicose veins   . Ingrowing nail   . Depression   . Chronic back pain   . Abnormal pap   . DDD (degenerative disc disease)   . Migraines   . Chronic nausea    Past Surgical History  Procedure Laterality Date  . Tonsillectomy    . Esophagogastroduodenoscopy  06/11/2011     SMALL HIATAL HERNIA/Moderate gastritis/  BURNING CHESTPAIN: GERD, NERD, OR NON-ULCER DYSPEPSIA/ Nl  esophagus   Family History  Problem Relation Age of Onset  . Pancreatic cancer Father 31   History  Substance Use Topics  . Smoking status: Former Smoker    Types: Cigarettes  . Smokeless tobacco: Not on file     Comment: pt reports '4 cigarettes per day."  . Alcohol Use: Yes     Comment: occasionally   OB History   Grav Para Term Preterm Abortions TAB SAB Ect Mult Living                 Review of Systems  Constitutional: Negative for fever and chills.  HENT: Negative for ear pain.   Eyes: Positive for photophobia. Negative for visual disturbance.  Respiratory: Negative for shortness of breath.   Cardiovascular: Negative for chest pain.  Gastrointestinal: Positive for nausea. Negative for vomiting.  Neurological: Positive for headaches.  All other systems reviewed and are negative.    Allergies  Review of patient's allergies indicates no known allergies.  Home Medications   Current Outpatient Rx  Name  Route  Sig  Dispense  Refill  . ALPRAZolam (XANAX) 0.5 MG tablet   Oral   Take 0.5 mg by mouth 2 (two) times daily as needed for sleep or anxiety.         . citalopram (CELEXA) 40 MG tablet   Oral   Take 40 mg by mouth daily.         Marland Kitchen HYDROcodone-acetaminophen (NORCO) 10-325 MG per tablet   Oral  Take 1 tablet by mouth every 6 (six) hours as needed. Pain         . ibuprofen (ADVIL,MOTRIN) 200 MG tablet   Oral   Take 600-800 mg by mouth every 6 (six) hours as needed. Pain         . levonorgestrel-ethinyl estradiol (SEASONALE,INTROVALE,JOLESSA) 0.15-0.03 MG tablet   Oral   Take 1 tablet by mouth at bedtime.         Marland Kitchen omeprazole (PRILOSEC) 20 MG capsule   Oral   Take 20 mg by mouth QID.          Marland Kitchen topiramate (TOPAMAX) 25 MG tablet   Oral   Take 25 mg by mouth 2 (two) times daily.         . baclofen (LIORESAL) 10 MG tablet   Oral   Take 1 tablet (10 mg total) by mouth 3 (three) times daily before meals.   93 tablet   11   . cephALEXin  (KEFLEX) 500 MG capsule   Oral   Take 1 capsule (500 mg total) by mouth 4 (four) times daily.   28 capsule   0   . escitalopram (LEXAPRO) 20 MG tablet   Oral   Take 20 mg by mouth daily.         Marland Kitchen esomeprazole (NEXIUM) 40 MG capsule   Oral   Take 40 mg by mouth 2 (two) times daily.         . metoCLOPramide (REGLAN) 10 MG tablet   Oral   Take 1 tablet (10 mg total) by mouth every 6 (six) hours as needed (for headache or nausea).   6 tablet   0   . Multiple Vitamin (MULTIVITAMIN WITH MINERALS) TABS   Oral   Take 1 tablet by mouth daily.         Marland Kitchen sulfamethoxazole-trimethoprim (BACTRIM DS) 800-160 MG per tablet   Oral   Take 1 tablet by mouth 2 (two) times daily.   14 tablet   0    BP 150/83  Pulse 70  Temp(Src) 97.8 F (36.6 C) (Oral)  Resp 16  Ht 5\' 3"  (1.6 m)  Wt 253 lb (114.76 kg)  BMI 44.83 kg/m2  SpO2 99%  LMP 12/24/2012 Physical Exam  Nursing note and vitals reviewed. Constitutional: She is oriented to person, place, and time. She appears well-developed and well-nourished.  HENT:  Head: Normocephalic and atraumatic.  Eyes: Pupils are equal, round, and reactive to light.  Neck: Neck supple.  Cardiovascular: Normal rate, regular rhythm and normal heart sounds.   Pulmonary/Chest: Effort normal. No respiratory distress. She has no wheezes.  Abdominal: Soft. Bowel sounds are normal.  Neurological: She is alert and oriented to person, place, and time.  Skin: Skin is warm and dry.  Psychiatric: She has a normal mood and affect.    ED Course  Procedures (including critical care time) DIAGNOSTIC STUDIES: Oxygen Saturation is 99% on RA, normal by my interpretation.    COORDINATION OF CARE: 8:25 AM- Pt advised of plan for treatment and pt agrees.  10:02 AM: upon recheck pt is feeling better and is sleeping    Labs Review Labs Reviewed - No data to display Imaging Review No results found.  EKG Interpretation   None       MDM  No diagnosis  found. 27 year old female history of migraines presents today with typical migraine headache which has resolved with IV medications consisting of Solu-Medrol, Compazine, and Benadryl. She is advised of preterm  precautions.   Hilario Quarry, MD 01/05/13 1018

## 2013-01-05 NOTE — ED Notes (Signed)
Pt c/o migraine headache that started two days ago with nausea, denies any vomiting, states that she has hx of migraine headaches and this feel similar to what she has had in the past.

## 2013-01-11 ENCOUNTER — Ambulatory Visit: Payer: Medicaid Other | Admitting: Neurology

## 2013-01-26 ENCOUNTER — Emergency Department (HOSPITAL_COMMUNITY)
Admission: EM | Admit: 2013-01-26 | Discharge: 2013-01-27 | Disposition: A | Discharge status: Home / Self Care | Payer: Medicaid Other | Attending: Emergency Medicine | Admitting: Emergency Medicine

## 2013-01-26 ENCOUNTER — Encounter (HOSPITAL_COMMUNITY): Payer: Self-pay | Admitting: Emergency Medicine

## 2013-01-26 DIAGNOSIS — K219 Gastro-esophageal reflux disease without esophagitis: Secondary | ICD-10-CM | POA: Insufficient documentation

## 2013-01-26 DIAGNOSIS — Z8679 Personal history of other diseases of the circulatory system: Secondary | ICD-10-CM | POA: Insufficient documentation

## 2013-01-26 DIAGNOSIS — G43909 Migraine, unspecified, not intractable, without status migrainosus: Secondary | ICD-10-CM | POA: Insufficient documentation

## 2013-01-26 DIAGNOSIS — G8929 Other chronic pain: Secondary | ICD-10-CM | POA: Insufficient documentation

## 2013-01-26 DIAGNOSIS — R519 Headache, unspecified: Secondary | ICD-10-CM

## 2013-01-26 DIAGNOSIS — F3289 Other specified depressive episodes: Secondary | ICD-10-CM | POA: Insufficient documentation

## 2013-01-26 DIAGNOSIS — F329 Major depressive disorder, single episode, unspecified: Secondary | ICD-10-CM | POA: Insufficient documentation

## 2013-01-26 DIAGNOSIS — F410 Panic disorder [episodic paroxysmal anxiety] without agoraphobia: Secondary | ICD-10-CM | POA: Insufficient documentation

## 2013-01-26 DIAGNOSIS — R51 Headache: Secondary | ICD-10-CM | POA: Insufficient documentation

## 2013-01-26 DIAGNOSIS — Z9089 Acquired absence of other organs: Secondary | ICD-10-CM | POA: Insufficient documentation

## 2013-01-26 DIAGNOSIS — Z79899 Other long term (current) drug therapy: Secondary | ICD-10-CM | POA: Insufficient documentation

## 2013-01-26 DIAGNOSIS — R109 Unspecified abdominal pain: Secondary | ICD-10-CM | POA: Insufficient documentation

## 2013-01-26 DIAGNOSIS — R000 Tachycardia, unspecified: Secondary | ICD-10-CM | POA: Insufficient documentation

## 2013-01-26 DIAGNOSIS — R112 Nausea with vomiting, unspecified: Secondary | ICD-10-CM

## 2013-01-26 DIAGNOSIS — Z8739 Personal history of other diseases of the musculoskeletal system and connective tissue: Secondary | ICD-10-CM | POA: Insufficient documentation

## 2013-01-26 DIAGNOSIS — R197 Diarrhea, unspecified: Secondary | ICD-10-CM | POA: Insufficient documentation

## 2013-01-26 DIAGNOSIS — Z872 Personal history of diseases of the skin and subcutaneous tissue: Secondary | ICD-10-CM | POA: Insufficient documentation

## 2013-01-26 DIAGNOSIS — Z87891 Personal history of nicotine dependence: Secondary | ICD-10-CM | POA: Insufficient documentation

## 2013-01-26 MED ORDER — KETOROLAC TROMETHAMINE 30 MG/ML IJ SOLN
30.0000 mg | Freq: Once | INTRAMUSCULAR | Status: AC
  Administered 2013-01-26: 30 mg via INTRAVENOUS
  Filled 2013-01-26: qty 1

## 2013-01-26 MED ORDER — SODIUM CHLORIDE 0.9 % IV BOLUS (SEPSIS)
1000.0000 mL | Freq: Once | INTRAVENOUS | Status: AC
  Administered 2013-01-26: 1000 mL via INTRAVENOUS

## 2013-01-26 MED ORDER — ONDANSETRON HCL 4 MG/2ML IJ SOLN
4.0000 mg | Freq: Once | INTRAMUSCULAR | Status: AC
  Administered 2013-01-26: 4 mg via INTRAVENOUS
  Filled 2013-01-26: qty 2

## 2013-01-26 NOTE — ED Provider Notes (Signed)
CSN: [id number]     Arrival date & time 01/26/13  2116 History   First MD Initiated Contact with Patient 01/26/13 2236     This chart was scribed for [facility] R. Rubin Payor, MD by Manuela Schwartz, ED scribe. This patient was seen in room APA18/APA18 and the patient's care was started at 2236.  Chief Complaint  Patient presents with  . Nausea  . Emesis  . Diarrhea   The history is provided by the patient. No language interpreter was used.   HPI Comments: Haley Brady is a 27 y.o. female who presents to the Emergency Department complaining of constant gradually worsening nausea, emesis episodes and diarrhea w/associated diffuse abdominal pain, onset 6 days ago. She reports began w/a cough 6 days ago which has since improved but began w/her N/V/D and has since worsened. She reports associated HA and has been in bed resting past several days. She denies sick contacts w/similar sx.   Past Medical History  Diagnosis Date  . Panic disorder without agoraphobia   . Esophageal reflux   . Asymptomatic varicose veins   . Ingrowing nail   . Depression   . Chronic back pain   . Abnormal pap   . DDD (degenerative disc disease)   . Migraines   . Chronic nausea    Past Surgical History  Procedure Laterality Date  . Tonsillectomy    . Esophagogastroduodenoscopy  06/11/2011     SMALL HIATAL HERNIA/Moderate gastritis/  BURNING CHESTPAIN: GERD, NERD, OR NON-ULCER DYSPEPSIA/ Nl esophagus   Family History  Problem Relation Age of Onset  . Pancreatic cancer Father 90   History  Substance Use Topics  . Smoking status: Former Smoker    Types: Cigarettes  . Smokeless tobacco: Not on file     Comment: pt reports '4 cigarettes per day."  . Alcohol Use: Yes     Comment: occasionally   OB History   Grav Para Term Preterm Abortions TAB SAB Ect Mult Living                 Review of Systems  Constitutional: Negative for fever and chills.  HENT: Negative for congestion and rhinorrhea.   Respiratory:  Negative for shortness of breath.   Cardiovascular: Negative for chest pain.  Gastrointestinal: Positive for nausea, vomiting, abdominal pain and diarrhea.  Musculoskeletal: Negative for neck pain.  Skin: Negative for color change and rash.  Neurological: Negative for syncope.  All other systems reviewed and are negative.   A complete 10 system review of systems was obtained and all systems are negative except as noted in the HPI and PMH.   Allergies  Review of patient's allergies indicates no known allergies.  Home Medications   Current Outpatient Rx  Name  Route  Sig  Dispense  Refill  . ALPRAZolam (XANAX) 0.5 MG tablet   Oral   Take 0.5 mg by mouth 2 (two) times daily as needed for sleep or anxiety.         Marland Kitchen aspirin-acetaminophen-caffeine (EXCEDRIN MIGRAINE) 250-250-65 MG per tablet   Oral   Take 2 tablets by mouth daily as needed for headache.         . baclofen (LIORESAL) 10 MG tablet   Oral   Take 1 tablet (10 mg total) by mouth 3 (three) times daily before meals.   93 tablet   11   . citalopram (CELEXA) 40 MG tablet   Oral   Take 40 mg by mouth daily.         Marland Kitchen  HYDROcodone-acetaminophen (NORCO) 10-325 MG per tablet   Oral   Take 1 tablet by mouth every 6 (six) hours as needed. Pain         . ibuprofen (ADVIL,MOTRIN) 200 MG tablet   Oral   Take 600-800 mg by mouth every 6 (six) hours as needed for mild pain. Pain         . loperamide (IMODIUM A-D) 2 MG tablet   Oral   Take 2 mg by mouth 4 (four) times daily as needed for diarrhea or loose stools.         . metoCLOPramide (REGLAN) 10 MG tablet   Oral   Take 10 mg by mouth once as needed for nausea.         Marland Kitchen omeprazole (PRILOSEC) 20 MG capsule   Oral   Take 20 mg by mouth QID.          Marland Kitchen promethazine (PHENERGAN) 25 MG tablet   Oral   Take 25 mg by mouth once as needed for nausea or vomiting.         . topiramate (TOPAMAX) 25 MG tablet   Oral   Take 25 mg by mouth 2 (two) times  daily.          Triage Vitals: BP 138/78  Pulse 68  Temp(Src) 98.5 F (36.9 C) (Oral)  Resp 20  Ht 5\' 3"  (1.6 m)  Wt 253 lb (114.76 kg)  BMI 44.83 kg/m2  SpO2 100%  LMP 01/26/2013 Physical Exam  Nursing note and vitals reviewed. Constitutional: She is oriented to person, place, and time. She appears well-developed and well-nourished. No distress.  HENT:  Head: Normocephalic and atraumatic.  Mouth/Throat: Oropharynx is clear and moist.  No post oropharyngeal erythema  Eyes: Conjunctivae are normal. Right eye exhibits no discharge. Left eye exhibits no discharge.  Neck: Normal range of motion.  Cardiovascular: Regular rhythm and normal heart sounds.   No murmur heard. Mildly tachycardic  Pulmonary/Chest: Effort normal and breath sounds normal. No respiratory distress. She has no wheezes. She has no rales.  Abdominal: Soft. There is no tenderness.  Musculoskeletal: Normal range of motion. She exhibits no edema.  Neurological: She is alert and oriented to person, place, and time.  Skin: Skin is warm and dry.  Psychiatric: She has a normal mood and affect. Thought content normal.    ED Course  Procedures (including critical care time) DIAGNOSTIC STUDIES: Oxygen Saturation is 100% on room air, normal by my interpretation.    COORDINATION OF CARE: At 1040 PM Discussed treatment plan with patient. Patient agrees.   Labs Review Labs Reviewed  URINALYSIS, ROUTINE W REFLEX MICROSCOPIC   Imaging Review No results found.  EKG Interpretation   None       MDM   1. Nausea vomiting and diarrhea   2. Headache    Patient with nausea vomiting diarrhea. Has also recently had URI symptoms. We'll treat with IV fluids antibiotics and some pain medicines. We'll start with Zofran but may need other antibiotics, especially with her migraine history. Also has headache, which is unusual for patient. She's overall well-appearing. Care will be turned over to Dr. Stevphen Meuse  R. Rubin Payor, MD 01/26/13 [phone]

## 2013-01-26 NOTE — ED Notes (Signed)
Began coughing 6 days ago, two days of fever, nausea, vomiting, diarrhea, headache

## 2013-01-26 NOTE — ED Notes (Signed)
Pt reports n/v/d, fever, generalized body aches and a HA x6 days - admits to being around co-workers that have had URIs.

## 2013-01-27 LAB — URINALYSIS, ROUTINE W REFLEX MICROSCOPIC
Nitrite: NEGATIVE
Urobilinogen, UA: 0.2 mg/dL (ref 0.0–1.0)

## 2013-01-27 LAB — URINE MICROSCOPIC-ADD ON

## 2013-01-27 MED ORDER — HYDROCODONE-ACETAMINOPHEN 5-325 MG PO TABS: 2.0000 | ORAL_TABLET | ORAL | Status: DC | PRN

## 2013-01-27 MED ORDER — ONDANSETRON 4 MG PO TBDP: 4.0000 mg | ORAL_TABLET | Freq: Three times a day (TID) | ORAL | Status: DC | PRN

## 2013-01-27 MED ORDER — MORPHINE SULFATE 4 MG/ML IJ SOLN
4.0000 mg | Freq: Once | INTRAMUSCULAR | Status: AC
  Administered 2013-01-27: 4 mg via INTRAVENOUS
  Filled 2013-01-27: qty 1

## 2013-01-27 MED ORDER — NAPROXEN 500 MG PO TABS: 500.0000 mg | ORAL_TABLET | Freq: Two times a day (BID) | ORAL | Status: DC

## 2013-01-27 NOTE — ED Provider Notes (Signed)
Patient reexamined, soft and, reports nausea vomiting and diarrhea for the last 3 days, abdominal cramping but states that the symptoms are improving and now she has a headache. She has normal mental status, she is able to follow, has clear speech, is very interactive and does not appear to be in acute distress. Vital signs are normal, labs unremarkable except for mild dehydration. Fluids and pain medications given, antiemetics given, patient stable for discharge. I informed her of her results, she is ammenable for followup.  Meds given in ED:  Medications  sodium chloride 0.9 % bolus 1,000 mL (0 mLs Intravenous Stopped 01/27/13 0000)  ondansetron (ZOFRAN) injection 4 mg (4 mg Intravenous Given 01/26/13 2254)  ketorolac (TORADOL) 30 MG/ML injection 30 mg (30 mg Intravenous Given 01/26/13 2254)  morphine 4 MG/ML injection 4 mg (4 mg Intravenous Given 01/27/13 0005)    New Prescriptions   HYDROCODONE-ACETAMINOPHEN (NORCO/VICODIN) 5-325 MG PER TABLET    Take 2 tablets by mouth every 4 (four) hours as needed.   NAPROXEN (NAPROSYN) 500 MG TABLET    Take 1 tablet (500 mg total) by mouth 2 (two) times daily with a meal.   ONDANSETRON (ZOFRAN ODT) 4 MG DISINTEGRATING TABLET    Take 1 tablet (4 mg total) by mouth every 8 (eight) hours as needed for nausea.      Vida Roller, MD 01/27/13 [phone]

## 2013-01-28 LAB — URINE CULTURE: Colony Count: >=100000

## 2013-03-10 ENCOUNTER — Encounter (HOSPITAL_COMMUNITY): Payer: Self-pay | Admitting: Emergency Medicine

## 2013-03-10 ENCOUNTER — Emergency Department (HOSPITAL_COMMUNITY)
Admission: EM | Admit: 2013-03-10 | Discharge: 2013-03-10 | Disposition: A | Discharge status: Home / Self Care | Payer: Medicaid Other | Attending: Emergency Medicine | Admitting: Emergency Medicine

## 2013-03-10 DIAGNOSIS — R110 Nausea: Secondary | ICD-10-CM | POA: Insufficient documentation

## 2013-03-10 DIAGNOSIS — Z8739 Personal history of other diseases of the musculoskeletal system and connective tissue: Secondary | ICD-10-CM | POA: Insufficient documentation

## 2013-03-10 DIAGNOSIS — Z79899 Other long term (current) drug therapy: Secondary | ICD-10-CM | POA: Insufficient documentation

## 2013-03-10 DIAGNOSIS — G43909 Migraine, unspecified, not intractable, without status migrainosus: Secondary | ICD-10-CM | POA: Insufficient documentation

## 2013-03-10 DIAGNOSIS — K219 Gastro-esophageal reflux disease without esophagitis: Secondary | ICD-10-CM | POA: Insufficient documentation

## 2013-03-10 DIAGNOSIS — R519 Headache, unspecified: Secondary | ICD-10-CM

## 2013-03-10 DIAGNOSIS — G8929 Other chronic pain: Secondary | ICD-10-CM | POA: Insufficient documentation

## 2013-03-10 DIAGNOSIS — F410 Panic disorder [episodic paroxysmal anxiety] without agoraphobia: Secondary | ICD-10-CM | POA: Insufficient documentation

## 2013-03-10 DIAGNOSIS — R51 Headache: Secondary | ICD-10-CM

## 2013-03-10 DIAGNOSIS — Z87891 Personal history of nicotine dependence: Secondary | ICD-10-CM | POA: Insufficient documentation

## 2013-03-10 MED ORDER — DIPHENHYDRAMINE HCL 50 MG/ML IJ SOLN
25.0000 mg | Freq: Once | INTRAMUSCULAR | Status: AC
  Administered 2013-03-10: 25 mg via INTRAVENOUS
  Filled 2013-03-10: qty 1

## 2013-03-10 MED ORDER — PROCHLORPERAZINE EDISYLATE 5 MG/ML IJ SOLN
10.0000 mg | Freq: Once | INTRAMUSCULAR | Status: AC
  Administered 2013-03-10: 10 mg via INTRAVENOUS
  Filled 2013-03-10: qty 2

## 2013-03-10 MED ORDER — KETOROLAC TROMETHAMINE 30 MG/ML IJ SOLN
30.0000 mg | Freq: Once | INTRAMUSCULAR | Status: AC
  Administered 2013-03-10: 30 mg via INTRAVENOUS
  Filled 2013-03-10: qty 1

## 2013-03-10 NOTE — Discharge Instructions (Signed)
Migraine Headache A migraine headache is an intense, throbbing pain on one or both sides of your head. A migraine can last for 30 minutes to several hours. CAUSES  The exact cause of a migraine headache is not always known. However, a migraine may be caused when nerves in the brain become irritated and release chemicals that cause inflammation. This causes pain. Certain things may also trigger migraines, such as:  Alcohol.  Smoking.  Stress.  Menstruation.  Aged cheeses.  Foods or drinks that contain nitrates, glutamate, aspartame, or tyramine.  Lack of sleep.  Chocolate.  Caffeine.  Hunger.  Physical exertion.  Fatigue.  Medicines used to treat chest pain (nitroglycerine), birth control pills, estrogen, and some blood pressure medicines. SIGNS AND SYMPTOMS  Pain on one or both sides of your head.  Pulsating or throbbing pain.  Severe pain that prevents daily activities.  Pain that is aggravated by any physical activity.  Nausea, vomiting, or both.  Dizziness.  Pain with exposure to bright lights, loud noises, or activity.  General sensitivity to bright lights, loud noises, or smells. Before you get a migraine, you may get warning signs that a migraine is coming (aura). An aura may include:  Seeing flashing lights.  Seeing bright spots, halos, or zig-zag lines.  Having tunnel vision or blurred vision.  Having feelings of numbness or tingling.  Having trouble talking.  Having muscle weakness. DIAGNOSIS  A migraine headache is often diagnosed based on:  Symptoms.  Physical exam.  A CT scan or MRI of your head. These imaging tests cannot diagnose migraines, but they can help rule out other causes of headaches. TREATMENT Medicines may be given for pain and nausea. Medicines can also be given to help prevent recurrent migraines.  HOME CARE INSTRUCTIONS  Only take over-the-counter or prescription medicines for pain or discomfort as directed by your  health care provider. The use of long-term narcotics is not recommended.  Lie down in a dark, quiet room when you have a migraine.  Keep a journal to find out what may trigger your migraine headaches. For example, write down:  What you eat and drink.  How much sleep you get.  Any change to your diet or medicines.  Limit alcohol consumption.  Quit smoking if you smoke.  Get 7 9 hours of sleep, or as recommended by your health care provider.  Limit stress.  Keep lights dim if bright lights bother you and make your migraines worse. SEEK IMMEDIATE MEDICAL CARE IF:   Your migraine becomes severe.  You have a fever.  You have a stiff neck.  You have vision loss.  You have muscular weakness or loss of muscle control.  You start losing your balance or have trouble walking.  You feel faint or pass out.  You have severe symptoms that are different from your first symptoms. MAKE SURE YOU:   Understand these instructions.  Will watch your condition.  Will get help right away if you are not doing well or get worse. Document Released: 01/14/2005 Document Revised: 11/04/2012 Document Reviewed: 09/21/2012 [employer] Patient Information 2014 [employer].  

## 2013-03-10 NOTE — ED Provider Notes (Signed)
CSN: [id number]     Arrival date & time 03/10/13  1527 History   This chart was scribed for [employer]. Alvino Chapel, MD by Era Bumpers, ED scribe. This patient was seen in room APA03/APA03 and the patient's care was started at 1527.  Chief Complaint  Patient presents with  . Migraine   The history is provided by the patient. No language interpreter was used.   HPI Comments: Haley Brady is a 28 y.o. female who presents to the Emergency Department w/hx complaining of moderate to severe, sharp/splitting, constant, migraine, onset last PM which began w/visual sx while she was getting ready for work. She normally doesn't have visual precipitation to her migraines but states otherwise this episode feels like her previous migraines. She reports associated nausea. She tried topomax, pain medicine, benadryl and reglan this AM w/temporary relief. She reports similar migraines occuring every 2 or 3 weeks, unsure what causes them. LNMP 2 weeks ago, she denies menstrual cycles occuring w/her migraines. She reports had a brief period of blurry vision about an hour ago which resolved on its own. She denies fever or recent trauma. She states sometimes has a "cloud" in her vision which resolves when she rubs her eyes. She denies chance for pregnancy.   Denies any medical hx  Past Medical History  Diagnosis Date  . Panic disorder without agoraphobia   . Esophageal reflux   . Asymptomatic varicose veins   . Ingrowing nail   . Depression   . Chronic back pain   . Abnormal pap   . DDD (degenerative disc disease)   . Migraines   . Chronic nausea    Past Surgical History  Procedure Laterality Date  . Tonsillectomy    . Esophagogastroduodenoscopy  06/11/2011     SMALL HIATAL HERNIA/Moderate gastritis/  BURNING CHESTPAIN: GERD, NERD, OR NON-ULCER DYSPEPSIA/ Nl esophagus   Family History  Problem Relation Age of Onset  . Pancreatic cancer Father 77   History  Substance Use Topics  . Smoking status:  Former Smoker    Types: Cigarettes  . Smokeless tobacco: Not on file     Comment: pt reports '4 cigarettes per day."  . Alcohol Use: Yes     Comment: occasionally   OB History   Grav Para Term Preterm Abortions TAB SAB Ect Mult Living                 Review of Systems  Constitutional: Negative for fever and chills.  Respiratory: Negative for cough and shortness of breath.   Cardiovascular: Negative for chest pain.  Gastrointestinal: Negative for abdominal pain.  Musculoskeletal: Negative for back pain.  Neurological: Positive for headaches.  All other systems reviewed and are negative.    Allergies  Review of patient's allergies indicates no known allergies.  Home Medications   Current Outpatient Rx  Name  Route  Sig  Dispense  Refill  . ALPRAZolam (XANAX) 0.5 MG tablet   Oral   Take 0.5 mg by mouth 2 (two) times daily as needed for sleep or anxiety.         Marland Kitchen aspirin-acetaminophen-caffeine (EXCEDRIN MIGRAINE) 250-250-65 MG per tablet   Oral   Take 2 tablets by mouth daily as needed for headache.         . baclofen (LIORESAL) 10 MG tablet   Oral   Take 1 tablet (10 mg total) by mouth 3 (three) times daily before meals.   93 tablet   11   . citalopram (CELEXA)  40 MG tablet   Oral   Take 40 mg by mouth daily.         Marland Kitchen HYDROcodone-acetaminophen (NORCO) 10-325 MG per tablet   Oral   Take 1 tablet by mouth every 6 (six) hours as needed. Pain         . ibuprofen (ADVIL,MOTRIN) 200 MG tablet   Oral   Take 600-800 mg by mouth every 6 (six) hours as needed for mild pain. Pain         . loperamide (IMODIUM A-D) 2 MG tablet   Oral   Take 2 mg by mouth 4 (four) times daily as needed for diarrhea or loose stools.         . metoCLOPramide (REGLAN) 10 MG tablet   Oral   Take 10 mg by mouth once as needed for nausea.         Marland Kitchen omeprazole (PRILOSEC) 20 MG capsule   Oral   Take 20 mg by mouth daily.         Marland Kitchen topiramate (TOPAMAX) 25 MG tablet    Oral   Take 25 mg by mouth 2 (two) times daily.          Triage Vitals: BP 155/91  Pulse 63  Temp(Src) 98.2 F (36.8 C)  Resp 18  Ht 5\' 3"  (1.6 m)  Wt 253 lb (114.76 kg)  BMI 44.83 kg/m2  SpO2 98%  LMP 02/24/2013  Physical Exam  Nursing note and vitals reviewed. Constitutional: She is oriented to person, place, and time. She appears well-developed and well-nourished. No distress.  HENT:  Head: Normocephalic and atraumatic.  Head is non tender Face is symmetric   Eyes: Conjunctivae and EOM are normal. Pupils are equal, round, and reactive to light. Right eye exhibits no discharge. Left eye exhibits no discharge.  Visual fields grossly normal  Neck: Normal range of motion.  Cardiovascular: Normal rate, regular rhythm and normal heart sounds.   No murmur heard. Pulmonary/Chest: Effort normal and breath sounds normal. No respiratory distress. She has no wheezes. She has no rales.  Musculoskeletal: Normal range of motion. She exhibits no edema.  Neurological: She is alert and oriented to person, place, and time.  Skin: Skin is warm and dry.  Psychiatric: She has a normal mood and affect. Thought content normal.   ED Course  Procedures (including critical care time) DIAGNOSTIC STUDIES: Oxygen Saturation is 98% on room air, normal by my interpretation.    COORDINATION OF CARE: At 350 PM Discussed treatment plan with patient which includes compazine, benadryl, toradol. Patient agrees.   Labs Review Labs Reviewed - No data to display Imaging Review No results found.  EKG Interpretation   None       MDM   Final diagnoses:  Headache   The patient presented with headache. It was similar to her previous migraines, except that she had some visual changes. Visual changes resolved. She feels much better after treatment and will be discharged home. Headaches have become more frequent, and she will follow with her neurologist. I personally performed the services described in  this documentation, which was scribed in my presence. The recorded information has been reviewed and is accurate.     Jasper Riling. Alvino Chapel, MD 03/10/13 [phone]

## 2013-03-10 NOTE — ED Notes (Signed)
Pt c/o migraine with nausea and blurred vision.

## 2013-04-28 ENCOUNTER — Emergency Department (HOSPITAL_COMMUNITY)
Admission: EM | Admit: 2013-04-28 | Discharge: 2013-04-29 | Disposition: A | Discharge status: Home / Self Care | Payer: Medicaid Other | Attending: Emergency Medicine | Admitting: Emergency Medicine

## 2013-04-28 ENCOUNTER — Encounter (HOSPITAL_COMMUNITY): Payer: Self-pay | Admitting: Emergency Medicine

## 2013-04-28 DIAGNOSIS — F3289 Other specified depressive episodes: Secondary | ICD-10-CM | POA: Insufficient documentation

## 2013-04-28 DIAGNOSIS — R1031 Right lower quadrant pain: Secondary | ICD-10-CM | POA: Insufficient documentation

## 2013-04-28 DIAGNOSIS — K219 Gastro-esophageal reflux disease without esophagitis: Secondary | ICD-10-CM | POA: Insufficient documentation

## 2013-04-28 DIAGNOSIS — Z3202 Encounter for pregnancy test, result negative: Secondary | ICD-10-CM | POA: Insufficient documentation

## 2013-04-28 DIAGNOSIS — M545 Low back pain, unspecified: Secondary | ICD-10-CM | POA: Insufficient documentation

## 2013-04-28 DIAGNOSIS — R109 Unspecified abdominal pain: Secondary | ICD-10-CM

## 2013-04-28 DIAGNOSIS — G8929 Other chronic pain: Secondary | ICD-10-CM | POA: Insufficient documentation

## 2013-04-28 DIAGNOSIS — Z79899 Other long term (current) drug therapy: Secondary | ICD-10-CM | POA: Insufficient documentation

## 2013-04-28 DIAGNOSIS — Z87891 Personal history of nicotine dependence: Secondary | ICD-10-CM | POA: Insufficient documentation

## 2013-04-28 DIAGNOSIS — Z87442 Personal history of urinary calculi: Secondary | ICD-10-CM | POA: Insufficient documentation

## 2013-04-28 DIAGNOSIS — Z872 Personal history of diseases of the skin and subcutaneous tissue: Secondary | ICD-10-CM | POA: Insufficient documentation

## 2013-04-28 DIAGNOSIS — F410 Panic disorder [episodic paroxysmal anxiety] without agoraphobia: Secondary | ICD-10-CM | POA: Insufficient documentation

## 2013-04-28 DIAGNOSIS — F329 Major depressive disorder, single episode, unspecified: Secondary | ICD-10-CM | POA: Insufficient documentation

## 2013-04-28 DIAGNOSIS — Z9889 Other specified postprocedural states: Secondary | ICD-10-CM | POA: Insufficient documentation

## 2013-04-28 DIAGNOSIS — Z87448 Personal history of other diseases of urinary system: Secondary | ICD-10-CM | POA: Insufficient documentation

## 2013-04-28 DIAGNOSIS — G43909 Migraine, unspecified, not intractable, without status migrainosus: Secondary | ICD-10-CM | POA: Insufficient documentation

## 2013-04-28 HISTORY — DX: Disorder of kidney and ureter, unspecified: N28.9

## 2013-04-28 LAB — URINALYSIS, ROUTINE W REFLEX MICROSCOPIC
BILIRUBIN URINE: NEGATIVE
Glucose, UA: NEGATIVE mg/dL
Ketones, ur: NEGATIVE mg/dL
Nitrite: NEGATIVE
PH: 6 (ref 5.0–8.0)
PROTEIN: NEGATIVE mg/dL
Urobilinogen, UA: 0.2 mg/dL (ref 0.0–1.0)

## 2013-04-28 LAB — URINE MICROSCOPIC-ADD ON

## 2013-04-28 LAB — PREGNANCY, URINE: PREG TEST UR: NEGATIVE

## 2013-04-28 MED ORDER — ONDANSETRON HCL 4 MG/2ML IJ SOLN
4.0000 mg | Freq: Once | INTRAMUSCULAR | Status: AC
  Administered 2013-04-28: 4 mg via INTRAVENOUS
  Filled 2013-04-28: qty 2

## 2013-04-28 NOTE — ED Notes (Signed)
Patient c/o right flank pain that radiates into her groin for several hours.  Patient states she has kidney stones before.

## 2013-04-29 LAB — CBC WITH DIFFERENTIAL/PLATELET
BASOS ABS: 0 10*3/uL (ref 0.0–0.1)
BASOS PCT: 1 % (ref 0–1)
Eosinophils Absolute: 0.2 10*3/uL (ref 0.0–0.7)
Eosinophils Relative: 4 % (ref 0–5)
HCT: 35.8 % — ABNORMAL LOW (ref 36.0–46.0)
HEMOGLOBIN: 11.9 g/dL — AB (ref 12.0–15.0)
Lymphocytes Relative: 36 % (ref 12–46)
Lymphs Abs: 2.3 10*3/uL (ref 0.7–4.0)
MCH: 30.7 pg (ref 26.0–34.0)
MCHC: 33.2 g/dL (ref 30.0–36.0)
MCV: 92.3 fL (ref 78.0–100.0)
MONO ABS: 0.6 10*3/uL (ref 0.1–1.0)
Monocytes Relative: 10 % (ref 3–12)
NEUTROS ABS: 3.2 10*3/uL (ref 1.7–7.7)
Neutrophils Relative %: 49 % (ref 43–77)
Platelets: 297 10*3/uL (ref 150–400)
RBC: 3.88 MIL/uL (ref 3.87–5.11)
RDW: 12.9 % (ref 11.5–15.5)
WBC: 6.3 10*3/uL (ref 4.0–10.5)

## 2013-04-29 LAB — COMPREHENSIVE METABOLIC PANEL
ALK PHOS: 43 U/L (ref 39–117)
ALT: 16 U/L (ref 0–35)
AST: 17 U/L (ref 0–37)
Albumin: 3.6 g/dL (ref 3.5–5.2)
BUN: 13 mg/dL (ref 6–23)
CO2: 29 mEq/L (ref 19–32)
Calcium: 9 mg/dL (ref 8.4–10.5)
Chloride: 101 mEq/L (ref 96–112)
Creatinine, Ser: 0.97 mg/dL (ref 0.50–1.10)
GFR calc non Af Amer: 79 mL/min — ABNORMAL LOW (ref >90)
GLUCOSE: 100 mg/dL — AB (ref 70–99)
POTASSIUM: 4.5 meq/L (ref 3.7–5.3)
SODIUM: 139 meq/L (ref 137–147)
TOTAL PROTEIN: 7.1 g/dL (ref 6.0–8.3)
Total Bilirubin: <0.2 mg/dL — ABNORMAL LOW (ref 0.3–1.2)

## 2013-04-29 MED ORDER — KETOROLAC TROMETHAMINE 30 MG/ML IJ SOLN
30.0000 mg | Freq: Once | INTRAMUSCULAR | Status: AC
  Administered 2013-04-29: 30 mg via INTRAVENOUS
  Filled 2013-04-29: qty 1

## 2013-04-29 MED ORDER — HYDROCODONE-ACETAMINOPHEN 10-325 MG PO TABS: 1.0000 | ORAL_TABLET | Freq: Four times a day (QID) | ORAL | Status: DC | PRN

## 2013-04-29 MED ORDER — HYDROMORPHONE HCL PF 1 MG/ML IJ SOLN
1.0000 mg | Freq: Once | INTRAMUSCULAR | Status: AC
  Administered 2013-04-29: 1 mg via INTRAVENOUS
  Filled 2013-04-29: qty 1

## 2013-04-29 MED ORDER — SODIUM CHLORIDE 0.9 % IV BOLUS (SEPSIS)
1000.0000 mL | Freq: Once | INTRAVENOUS | Status: AC
  Administered 2013-04-29: 1000 mL via INTRAVENOUS

## 2013-04-29 MED ORDER — ONDANSETRON HCL 4 MG/2ML IJ SOLN
4.0000 mg | Freq: Once | INTRAMUSCULAR | Status: AC
  Administered 2013-04-29: 4 mg via INTRAVENOUS
  Filled 2013-04-29: qty 2

## 2013-04-29 NOTE — Discharge Instructions (Signed)
As discussed, you are being treated empirically for kidney stones.  Return here for concerning changes in your condition, otherwise please be sure to follow up with our urologist.

## 2013-04-29 NOTE — ED Provider Notes (Signed)
CSN: [id number]     Arrival date & time 04/28/13  2120 History  This chart was scribed for Carmin Muskrat, MD by Marcha Dutton, ED Scribe. This patient was seen in room APA12/APA12 and the patient's care was started at 12:10 AM.    Chief Complaint  Patient presents with  . Flank Pain      The history is provided by the patient. No language interpreter was used.   HPI Comments: Haley Brady is a 28 y.o. female with a h/o kidney stones who presents to the Emergency Department complaining of constant, gradually worsening, moderate to severe right flank pain that began around 3 hours ago. She reports the pain radiating into her lower back and groin. She states she became hot and nauseated prior to the onset of the pain. She states she felt similar pain passing a kidney stone "a few months ago." Pt states she doesn't have a urologist.   Past Medical History  Diagnosis Date  . Panic disorder without agoraphobia   . Esophageal reflux   . Asymptomatic varicose veins   . Ingrowing nail   . Depression   . Chronic back pain   . Abnormal pap   . DDD (degenerative disc disease)   . Migraines   . Chronic nausea   . Renal disorder    Past Surgical History  Procedure Laterality Date  . Tonsillectomy    . Esophagogastroduodenoscopy  06/11/2011     SMALL HIATAL HERNIA/Moderate gastritis/  BURNING CHESTPAIN: GERD, NERD, OR NON-ULCER DYSPEPSIA/ Nl esophagus   Family History  Problem Relation Age of Onset  . Pancreatic cancer Father 48   History  Substance Use Topics  . Smoking status: Former Smoker    Types: Cigarettes  . Smokeless tobacco: Not on file     Comment: pt reports '4 cigarettes per day."  . Alcohol Use: Yes     Comment: occasionally   OB History   Grav Para Term Preterm Abortions TAB SAB Ect Mult Living                 Review of Systems  Constitutional:       Per HPI, otherwise negative  HENT:       Per HPI, otherwise negative  Respiratory:       Per HPI,  otherwise negative  Cardiovascular:       Per HPI, otherwise negative  Gastrointestinal: Positive for nausea. Negative for vomiting.  Endocrine:       Negative aside from HPI  Genitourinary:       Neg aside from HPI   Musculoskeletal: Positive for back pain.       Per HPI, otherwise negative  Skin: Negative.   Neurological: Negative for syncope.      Allergies  Review of patient's allergies indicates no known allergies.  Home Medications   Current Outpatient Rx  Name  Route  Sig  Dispense  Refill  . ALPRAZolam (XANAX) 0.5 MG tablet   Oral   Take 0.5 mg by mouth 2 (two) times daily as needed for sleep or anxiety.         Marland Kitchen aspirin-acetaminophen-caffeine (EXCEDRIN MIGRAINE) 250-250-65 MG per tablet   Oral   Take 2 tablets by mouth daily as needed for headache.         . baclofen (LIORESAL) 10 MG tablet   Oral   Take 1 tablet (10 mg total) by mouth 3 (three) times daily before meals.   93 tablet  11   . citalopram (CELEXA) 40 MG tablet   Oral   Take 40 mg by mouth every morning.          Marland Kitchen HYDROcodone-acetaminophen (NORCO) 10-325 MG per tablet   Oral   Take 1 tablet by mouth every 6 (six) hours as needed. Pain         . ibuprofen (ADVIL,MOTRIN) 200 MG tablet   Oral   Take 600-800 mg by mouth every 6 (six) hours as needed for mild pain. Pain         . loperamide (IMODIUM A-D) 2 MG tablet   Oral   Take 2 mg by mouth 4 (four) times daily as needed for diarrhea or loose stools.         Marland Kitchen omeprazole (PRILOSEC) 20 MG capsule   Oral   Take 20 mg by mouth every morning.          . topiramate (TOPAMAX) 50 MG tablet   Oral   Take 50 mg by mouth 2 (two) times daily.          Triage Vitals: BP 154/95  Pulse 80  Temp(Src) 97.8 F (36.6 C) (Oral)  Resp 16  SpO2 95%  LMP 04/26/2013  Physical Exam  Nursing note and vitals reviewed. Constitutional: She is oriented to person, place, and time. She appears well-developed and well-nourished. No  distress.  HENT:  Head: Normocephalic and atraumatic.  Eyes: Conjunctivae and EOM are normal.  Cardiovascular: Normal rate and regular rhythm.   Pulmonary/Chest: Effort normal and breath sounds normal. No stridor. No respiratory distress.  Abdominal: Soft. She exhibits no distension. There is tenderness (RLQ and right flank ).  Musculoskeletal: She exhibits no edema.  Neurological: She is alert and oriented to person, place, and time. No cranial nerve deficit.  Skin: Skin is warm and dry.  Psychiatric: She has a normal mood and affect.    ED Course  Procedures (including critical care time)  DIAGNOSTIC STUDIES: Oxygen Saturation is 95% on RA, adequate by my interpretation.    COORDINATION OF CARE: 12:14 AM- Pt's parents advised of plan for treatment. Parents verbalize understanding and agreement with plan.     Labs Review Labs Reviewed  URINALYSIS, ROUTINE W REFLEX MICROSCOPIC - Abnormal; Notable for the following:    Specific Gravity, Urine >1.030 (*)    Hgb urine dipstick MODERATE (*)    Leukocytes, UA SMALL (*)    All other components within normal limits  URINE MICROSCOPIC-ADD ON - Abnormal; Notable for the following:    Squamous Epithelial / LPF MANY (*)    Bacteria, UA MANY (*)    All other components within normal limits  COMPREHENSIVE METABOLIC PANEL - Abnormal; Notable for the following:    Glucose, Bld 100 (*)    Total Bilirubin <0.2 (*)    GFR calc non Af Amer 79 (*)    All other components within normal limits  CBC WITH DIFFERENTIAL - Abnormal; Notable for the following:    Hemoglobin 11.9 (*)    HCT 35.8 (*)    All other components within normal limits  PREGNANCY, URINE   1:41 AM On exam the patient appears comfortable.  She states that her pain is substantially managed, though there is some pain in her back. No new complaints. We discussed all findings, the need followup with urology.   MDM  This generally well-appearing female presents with new  flank pain.  Notably, the patient does have a history of kidney stone, describes  his pain as being the same.  There is no evidence of infection or obstruction.  With prior CT scans, history of stone, patient was amenable to empiric therapy for kidney stone, and follow with urology to avoid additional radiation exposure. Ultrasound is not currently available at this facility. Absent distress, with stable vital signs, this course seems appropriate.    I personally performed the services described in this documentation, which was scribed in my presence. The recorded information has been reviewed and is accurate.      Carmin Muskrat, MD 04/29/13 [phone]

## 2013-06-27 ENCOUNTER — Emergency Department (HOSPITAL_COMMUNITY): Payer: Medicaid Other

## 2013-06-27 ENCOUNTER — Emergency Department (HOSPITAL_COMMUNITY)
Admission: EM | Admit: 2013-06-27 | Discharge: 2013-06-27 | Disposition: A | Discharge status: Home / Self Care | Payer: Medicaid Other | Attending: Emergency Medicine | Admitting: Emergency Medicine

## 2013-06-27 ENCOUNTER — Encounter (HOSPITAL_COMMUNITY): Payer: Self-pay | Admitting: Emergency Medicine

## 2013-06-27 DIAGNOSIS — Z791 Long term (current) use of non-steroidal anti-inflammatories (NSAID): Secondary | ICD-10-CM | POA: Insufficient documentation

## 2013-06-27 DIAGNOSIS — Z872 Personal history of diseases of the skin and subcutaneous tissue: Secondary | ICD-10-CM | POA: Insufficient documentation

## 2013-06-27 DIAGNOSIS — Z79899 Other long term (current) drug therapy: Secondary | ICD-10-CM | POA: Insufficient documentation

## 2013-06-27 DIAGNOSIS — F3289 Other specified depressive episodes: Secondary | ICD-10-CM | POA: Insufficient documentation

## 2013-06-27 DIAGNOSIS — Z3202 Encounter for pregnancy test, result negative: Secondary | ICD-10-CM | POA: Insufficient documentation

## 2013-06-27 DIAGNOSIS — K219 Gastro-esophageal reflux disease without esophagitis: Secondary | ICD-10-CM | POA: Insufficient documentation

## 2013-06-27 DIAGNOSIS — B349 Viral infection, unspecified: Secondary | ICD-10-CM

## 2013-06-27 DIAGNOSIS — G43909 Migraine, unspecified, not intractable, without status migrainosus: Secondary | ICD-10-CM | POA: Insufficient documentation

## 2013-06-27 DIAGNOSIS — B9789 Other viral agents as the cause of diseases classified elsewhere: Secondary | ICD-10-CM | POA: Insufficient documentation

## 2013-06-27 DIAGNOSIS — G8929 Other chronic pain: Secondary | ICD-10-CM | POA: Insufficient documentation

## 2013-06-27 DIAGNOSIS — F329 Major depressive disorder, single episode, unspecified: Secondary | ICD-10-CM | POA: Insufficient documentation

## 2013-06-27 DIAGNOSIS — H9209 Otalgia, unspecified ear: Secondary | ICD-10-CM | POA: Insufficient documentation

## 2013-06-27 DIAGNOSIS — K1370 Unspecified lesions of oral mucosa: Secondary | ICD-10-CM | POA: Insufficient documentation

## 2013-06-27 DIAGNOSIS — F410 Panic disorder [episodic paroxysmal anxiety] without agoraphobia: Secondary | ICD-10-CM | POA: Insufficient documentation

## 2013-06-27 DIAGNOSIS — Z8679 Personal history of other diseases of the circulatory system: Secondary | ICD-10-CM | POA: Insufficient documentation

## 2013-06-27 DIAGNOSIS — IMO0002 Reserved for concepts with insufficient information to code with codable children: Secondary | ICD-10-CM | POA: Insufficient documentation

## 2013-06-27 DIAGNOSIS — Z87891 Personal history of nicotine dependence: Secondary | ICD-10-CM | POA: Insufficient documentation

## 2013-06-27 DIAGNOSIS — J40 Bronchitis, not specified as acute or chronic: Secondary | ICD-10-CM

## 2013-06-27 DIAGNOSIS — Z8742 Personal history of other diseases of the female genital tract: Secondary | ICD-10-CM | POA: Insufficient documentation

## 2013-06-27 LAB — POC URINE PREG, ED: Preg Test, Ur: NEGATIVE

## 2013-06-27 MED ORDER — ALBUTEROL SULFATE HFA 108 (90 BASE) MCG/ACT IN AERS
2.0000 | INHALATION_SPRAY | Freq: Once | RESPIRATORY_TRACT | Status: AC
  Administered 2013-06-27: 2 via RESPIRATORY_TRACT
  Filled 2013-06-27: qty 6.7

## 2013-06-27 MED ORDER — ALBUTEROL SULFATE (2.5 MG/3ML) 0.083% IN NEBU
2.5000 mg | INHALATION_SOLUTION | Freq: Once | RESPIRATORY_TRACT | Status: AC
  Administered 2013-06-27: 2.5 mg via RESPIRATORY_TRACT
  Filled 2013-06-27: qty 3

## 2013-06-27 MED ORDER — BENZONATATE 100 MG PO CAPS
200.0000 mg | ORAL_CAPSULE | Freq: Once | ORAL | Status: AC
  Administered 2013-06-27: 200 mg via ORAL
  Filled 2013-06-27: qty 2

## 2013-06-27 MED ORDER — IPRATROPIUM-ALBUTEROL 0.5-2.5 (3) MG/3ML IN SOLN
3.0000 mL | Freq: Once | RESPIRATORY_TRACT | Status: AC
  Administered 2013-06-27: 3 mL via RESPIRATORY_TRACT
  Filled 2013-06-27: qty 3

## 2013-06-27 MED ORDER — BENZONATATE 100 MG PO CAPS: 200.0000 mg | ORAL_CAPSULE | Freq: Three times a day (TID) | ORAL | Status: DC | PRN

## 2013-06-27 MED ORDER — MAGIC MOUTHWASH W/LIDOCAINE: 10.0000 mL | Freq: Four times a day (QID) | ORAL | Status: DC | PRN

## 2013-06-27 MED ORDER — PREDNISONE 20 MG PO TABS: ORAL_TABLET | ORAL | Status: DC

## 2013-06-27 MED ORDER — PREDNISONE 50 MG PO TABS
60.0000 mg | ORAL_TABLET | Freq: Once | ORAL | Status: AC
  Administered 2013-06-27: 60 mg via ORAL
  Filled 2013-06-27 (×2): qty 1

## 2013-06-27 NOTE — Discharge Instructions (Signed)
Bronchitis Bronchitis is inflammation of the airways that extend from the windpipe into the lungs (bronchi). The inflammation often causes mucus to develop, which leads to a cough. If the inflammation becomes severe, it may cause shortness of breath. CAUSES  Bronchitis may be caused by:   Viral infections.   Bacteria.   Cigarette smoke.   Allergens, pollutants, and other irritants.  SIGNS AND SYMPTOMS  The most common symptom of bronchitis is a frequent cough that produces mucus. Other symptoms include:  Fever.   Body aches.   Chest congestion.   Chills.   Shortness of breath.   Sore throat.  DIAGNOSIS  Bronchitis is usually diagnosed through a medical history and physical exam. Tests, such as chest X-rays, are sometimes done to rule out other conditions.  TREATMENT  You may need to avoid contact with whatever caused the problem (smoking, for example). Medicines are sometimes needed. These may include:  Antibiotics. These may be prescribed if the condition is caused by bacteria.  Cough suppressants. These may be prescribed for relief of cough symptoms.   Inhaled medicines. These may be prescribed to help open your airways and make it easier for you to breathe.   Steroid medicines. These may be prescribed for those with recurrent (chronic) bronchitis. HOME CARE INSTRUCTIONS  Get plenty of rest.   Drink enough fluids to keep your urine clear or pale yellow (unless you have a medical condition that requires fluid restriction). Increasing fluids may help thin your secretions and will prevent dehydration.   Only take over-the-counter or prescription medicines as directed by your health care provider.  Only take antibiotics as directed. Make sure you finish them even if you start to feel better.  Avoid secondhand smoke, irritating chemicals, and strong fumes. These will make bronchitis worse. If you are a smoker, quit smoking. Consider using nicotine gum or  skin patches to help control withdrawal symptoms. Quitting smoking will help your lungs heal faster.   Put a cool-mist humidifier in your bedroom at night to moisten the air. This may help loosen mucus. Change the water in the humidifier daily. You can also run the hot water in your shower and sit in the bathroom with the door closed for 5 10 minutes.   Follow up with your health care provider as directed.   Wash your hands frequently to avoid catching bronchitis again or spreading an infection to others.  SEEK MEDICAL CARE IF: Your symptoms do not improve after 1 week of treatment.  SEEK IMMEDIATE MEDICAL CARE IF:  Your fever increases.  You have chills.   You have chest pain.   You have worsening shortness of breath.   You have bloody sputum.  You faint.  You have lightheadedness.  You have a severe headache.   You vomit repeatedly. MAKE SURE YOU:   Understand these instructions.  Will watch your condition.  Will get help right away if you are not doing well or get worse. Document Released: 01/14/2005 Document Revised: 11/04/2012 Document Reviewed: 09/08/2012 [facility] Patient Information 2014 [place].   Take your next dose of prednisone tomorrow evening.  Use your inhaler every 4 hours (2 puffs) if you are wheezing.

## 2013-06-27 NOTE — ED Notes (Signed)
Pt c/o sore throat that started yesterday, unsure of any fever,

## 2013-06-27 NOTE — ED Provider Notes (Signed)
CSN: [id number]     Arrival date & time 06/27/13  1639 History  This chart was scribed for non-physician practitioner, Evalee Jefferson, PA-C,working with Fredia Sorrow, MD, by Marlowe Kays, ED Scribe.  This patient was seen in room APFT24/APFT24 and the patient's care was started at 5:43 PM.  Chief Complaint  Patient presents with  . Sore Throat   The history is provided by the patient. No language interpreter was used.   HPI Comments:  Haley Brady is a 28 y.o. female who presents to the Emergency Department complaining of scratchy, burning sore throat and severe, hacking, mildly productive cough of greenish-yellow mucus that started yesterday. She reports associated outer left ear pain and rib pain secondary to cough. She states she has taken Benadryl, Zyrtec, and Robitussin Cough Gels, Advil and Chloraseptic throat spray with mild relief. She denies sick contacts. She denies fever, congestion, nausea or vomiting. She states she is a "social smoker". She reports h/o tonsillectomy. She also reports a pimple to her upper lip on the left side that she has been expressing pus from. She denies h/o cold sores.  Past Medical History  Diagnosis Date  . Panic disorder without agoraphobia   . Esophageal reflux   . Asymptomatic varicose veins   . Ingrowing nail   . Depression   . Chronic back pain   . Abnormal pap   . DDD (degenerative disc disease)   . Migraines   . Chronic nausea   . Renal disorder    Past Surgical History  Procedure Laterality Date  . Tonsillectomy    . Esophagogastroduodenoscopy  06/11/2011     SMALL HIATAL HERNIA/Moderate gastritis/  BURNING CHESTPAIN: GERD, NERD, OR NON-ULCER DYSPEPSIA/ Nl esophagus   Family History  Problem Relation Age of Onset  . Pancreatic cancer Father 19   History  Substance Use Topics  . Smoking status: Former Smoker    Types: Cigarettes  . Smokeless tobacco: Not on file     Comment: pt reports '4 cigarettes per day."  . Alcohol Use:  Yes     Comment: occasionally   OB History   Grav Para Term Preterm Abortions TAB SAB Ect Mult Living                 Review of Systems  Constitutional: Negative for fever.  HENT: Positive for ear pain and sore throat. Negative for congestion.   Eyes: Negative.   Respiratory: Positive for cough. Negative for chest tightness and shortness of breath.   Cardiovascular: Negative for chest pain.  Gastrointestinal: Negative for nausea, vomiting and abdominal pain.  Genitourinary: Negative.   Musculoskeletal: Negative for arthralgias, joint swelling and neck pain.  Skin: Negative.  Negative for rash and wound.  Neurological: Negative for dizziness, weakness, light-headedness, numbness and headaches.  Psychiatric/Behavioral: Negative.     Allergies  Review of patient's allergies indicates no known allergies.  Home Medications   Prior to Admission medications   Medication Sig Start Date End Date Taking? Authorizing Provider  ALPRAZolam Duanne Moron) 0.5 MG tablet Take 0.5 mg by mouth 2 (two) times daily as needed for sleep or anxiety.   Yes Historical Provider, MD  baclofen (LIORESAL) 10 MG tablet Take 1 tablet (10 mg total) by mouth 3 (three) times daily before meals. 07/06/12  Yes Mahala Menghini, PA-C  citalopram (CELEXA) 40 MG tablet Take 40 mg by mouth every morning.    Yes Historical Provider, MD  ibuprofen (ADVIL,MOTRIN) 200 MG tablet Take 600-800 mg by mouth  every 6 (six) hours as needed for mild pain. Pain   Yes Historical Provider, MD  omeprazole (PRILOSEC) 20 MG capsule Take 20 mg by mouth every morning.    Yes Historical Provider, MD  phenol (CHLORASEPTIC) 1.4 % LIQD Use as directed 1 spray in the mouth or throat as needed for throat irritation / pain.   Yes Historical Provider, MD  Pseudoephedrine-DM-GG-APAP 30-10-100-250 MG CAPS Take 2 capsules by mouth as needed (Robitussin FOR COUGH).    Yes Historical Provider, MD  topiramate (TOPAMAX) 50 MG tablet Take 50 mg by mouth 2 (two) times  daily.   Yes Historical Provider, MD  Alum & Mag Hydroxide-Simeth (MAGIC MOUTHWASH W/LIDOCAINE) SOLN Take 10 mLs by mouth 4 (four) times daily as needed (throat pain). 06/27/13   Evalee Jefferson, PA-C  benzonatate (TESSALON) 100 MG capsule Take 2 capsules (200 mg total) by mouth 3 (three) times daily as needed for cough. 06/27/13   Evalee Jefferson, PA-C  predniSONE (DELTASONE) 20 MG tablet 3 tabs po qd x 4 days 06/27/13   Evalee Jefferson, PA-C   Triage Vitals: BP 160/94  Pulse 65  Temp(Src) 98.2 F (36.8 C) (Oral)  Resp 24  Ht 5\' 4"  (1.626 m)  Wt 253 lb (114.76 kg)  BMI 43.41 kg/m2  SpO2 100%  LMP 05/29/2013 Physical Exam  Nursing note and vitals reviewed. Constitutional: She appears well-developed and well-nourished.  HENT:  Head: Normocephalic and atraumatic.  2 small ulcers noted to soft palate.  Eyes: Conjunctivae are normal.  Neck: Normal range of motion.  Cardiovascular: Normal rate, regular rhythm and normal heart sounds.  Exam reveals no gallop and no friction rub.   No murmur heard. Pulmonary/Chest: Effort normal. No respiratory distress. She has wheezes (wheezes of right base with decreased breath sounds.).  Abdominal: Soft. Bowel sounds are normal. There is no tenderness.  Musculoskeletal: Normal range of motion.  Neurological: She is alert.  Skin: Skin is warm and dry.  Pimple-like lesion noted to left-sided upper lip.  Psychiatric: She has a normal mood and affect.    ED Course  Procedures (including critical care time) DIAGNOSTIC STUDIES: Oxygen Saturation is 100% on RA, normal by my interpretation.   COORDINATION OF CARE: 5:49 PM- Will order CXR and breathing treatment. Will prescribe cough suppressant and MDI. Pt verbalizes understanding and agrees to plan.  7:03 PM- Re-examined after breathing treatment and wheezing is resolved. Cough slightly improved.   Medications  predniSONE (DELTASONE) tablet 60 mg (60 mg Oral Given 06/27/13 1806)  benzonatate (TESSALON) capsule 200  mg (200 mg Oral Given 06/27/13 1806)  ipratropium-albuterol (DUONEB) 0.5-2.5 (3) MG/3ML nebulizer solution 3 mL (3 mLs Nebulization Given 06/27/13 1819)  albuterol (PROVENTIL) (2.5 MG/3ML) 0.083% nebulizer solution 2.5 mg (2.5 mg Nebulization Given 06/27/13 1820)  albuterol (PROVENTIL HFA;VENTOLIN HFA) 108 (90 BASE) MCG/ACT inhaler 2 puff (2 puffs Inhalation Given 06/27/13 1911)    Labs Review Labs Reviewed  POC URINE PREG, ED    Imaging Review Dg Chest 2 View  06/27/2013   CLINICAL DATA:  Sore throat congestion cough for 1 week  EXAM: CHEST  2 VIEW  COMPARISON:  None.  FINDINGS: The heart size and mediastinal contours are within normal limits. Both lungs are clear. The visualized skeletal structures are unremarkable.  IMPRESSION: No active cardiopulmonary disease.   Electronically Signed   By: Skipper Cliche M.D.   On: 06/27/2013 18:42     EKG Interpretation None      MDM   Final diagnoses:  Bronchitis  Viral syndrome   Patients labs and/or radiological studies were viewed and considered during the medical decision making and disposition process. Pt's wheezing resolved after albuterol/atrovent neb.  She was prescribed tessalon, prednisone, albuterol mdi given.  Also prescribed magic mouthwash for pain relief of aphthous ulcers.  I personally performed the services described in this documentation, which was scribed in my presence. The recorded information has been reviewed and is accurate.    Evalee Jefferson, PA-C 06/28/13 1240

## 2013-06-27 NOTE — ED Notes (Signed)
Pt alert & oriented x4, stable gait. Patient given discharge instructions, paperwork & prescription(s). Patient  instructed to stop at the registration desk to finish any additional paperwork. Patient verbalized understanding. Pt left department w/ no further questions. 

## 2013-06-30 NOTE — ED Provider Notes (Signed)
Medical screening examination/treatment/procedure(s) were performed by non-physician practitioner and as supervising physician I was immediately available for consultation/collaboration.   EKG Interpretation None        Fredia Sorrow, MD 06/30/13 1126

## 2013-07-13 ENCOUNTER — Telehealth: Payer: Self-pay | Admitting: Gastroenterology

## 2013-07-13 ENCOUNTER — Other Ambulatory Visit: Payer: Self-pay | Admitting: Orthopaedic Surgery

## 2013-07-13 DIAGNOSIS — M545 Low back pain, unspecified: Secondary | ICD-10-CM

## 2013-07-13 DIAGNOSIS — IMO0002 Reserved for concepts with insufficient information to code with codable children: Secondary | ICD-10-CM

## 2013-07-13 NOTE — Telephone Encounter (Signed)
Patient is out of Omeprazole, she is having heartburn really bad, shes asking for refills to be called to her pharmacy or samples of something please advise?

## 2013-07-13 NOTE — Telephone Encounter (Signed)
I called pt and she is out of her Omeprazole, last seen here in 04/2011.  I made her an appt with Laban Emperor, NP for 08/12/2013 at 10:30 AM.  I told her to try OTC Omeprazole until she could get in.  She said that she did not have any money and could not buy any.  Please advise!

## 2013-07-14 MED ORDER — PANTOPRAZOLE SODIUM 40 MG PO TBEC: DELAYED_RELEASE_TABLET | ORAL | Status: DC

## 2013-07-14 NOTE — Addendum Note (Signed)
Addended by: Danie Binder on: 07/14/2013 10:51 AM   Modules accepted: Orders

## 2013-07-14 NOTE — Telephone Encounter (Signed)
Called and informed pt.  

## 2013-07-14 NOTE — Telephone Encounter (Addendum)
I REVIEWED DRUG INTERACTIONS WITH PHARMACY.  OMEPRAZOLE MAY INTERACT WITH CELEXA.

## 2013-07-14 NOTE — Telephone Encounter (Signed)
PLEASE CALL PT. SHE SHOULD NOT TAKE OMEPRAZOLE WITH CELEXA. SHE MAY TAKE PROTONIX FOR HEARTBURN BID FOR 3 MOS THEN TRY IT ONCE DAILY. OPV IN JUL 2015.

## 2013-07-16 NOTE — Telephone Encounter (Signed)
Pt called and said she just started the Protonix. She took one bid on 07/14/2013. She said it did not help so she took two at once just one time on 07/15/2013. She said that did not help either and she woke up with heart burn this morning. She wants to know what she should do. She said she had only a hamburger patty and french fries. Please advise!

## 2013-07-16 NOTE — Telephone Encounter (Addendum)
PLEASE CALL PT. SHE SHOULD CONTINUE PROTONIX BID 30 MINS PRIOR TO MEALS. TAKE BACLOFEN 30 MINS PRIOR TO MEALS THREE TIMES A DAY. AVOID TRIGGERS FOR REFLUX(HAMBURGERS/FRENCH FRIES). SHE MAY PICK UP A HANDOUT. FOLLOW A LOW FAT DIET AND FOLLOW LIFESTYLE RECOMMENDATION TO HELP MANAGE REFLUX. SEE INFO BELOW.   Lifestyle and home remedies TO MANAGE REFLUX/CHEST PAIN  You may eliminate or reduce the frequency of heartburn by making the following lifestyle changes:    Control your weight. Being overweight is a major risk factor for heartburn and GERD. Excess pounds put pressure on your abdomen, pushing up your stomach and causing acid to back up into your esophagus.     Eat smaller meals. 4 TO 6 MEALS A DAY. This reduces pressure on the lower esophageal sphincter, helping to prevent the valve from opening and acid from washing back into your esophagus.     Loosen your belt. Clothes that fit tightly around your waist put pressure on your abdomen and the lower esophageal sphincter.     Eliminate heartburn triggers. Everyone has specific triggers. Common triggers such as fatty or fried foods, spicy food, tomato sauce, carbonated beverages, alcohol, chocolate, mint, garlic, onion, caffeine and nicotine may make heartburn worse.     Avoid stooping or bending. Tying your shoes is OK. Bending over for longer periods to weed your garden isn't, especially soon after eating.     Don't lie down after a meal. Wait at least three to four hours after eating before going to bed, and don't lie down right after eating.     PUT THE HEAD OF YOUR BED ON 6 INCH BLOCKS.   Alternative medicine   Several home remedies exist for treating GERD, but they provide only temporary relief. They include drinking baking soda (sodium bicarbonate) added to water or drinking other fluids such as baking soda mixed with cream of tartar and water.    Although these liquids create temporary relief by neutralizing, washing away or  buffering acids, eventually they aggravate the situation by adding gas and fluid to your stomach, increasing pressure and causing more acid reflux. Further, adding more sodium to your diet may increase your blood pressure and add stress to your heart, and excessive bicarbonate ingestion can alter the acid-base balance in your body.   Low-Fat Diet BREADS, CEREALS, PASTA, RICE, DRIED PEAS, AND BEANS These products are high in carbohydrates and most are low in fat. Therefore, they can be increased in the diet as substitutes for fatty foods. They too, however, contain calories and should not be eaten in excess. Cereals can be eaten for snacks as well as for breakfast.   FRUITS AND VEGETABLES It is good to eat fruits and vegetables. Besides being sources of fiber, both are rich in vitamins and some minerals. They help you get the daily allowances of these nutrients. Fruits and vegetables can be used for snacks and desserts.  MEATS Limit lean meat, chicken, [place], and fish to no more than 6 ounces per day. Beef, Pork, and Lamb Use lean cuts of beef, pork, and lamb. Lean cuts include:  Extra-lean ground beef.  Arm roast.  Sirloin tip.  Center-cut ham.  Round steak.  Loin chops.  Rump roast.  Tenderloin.  Trim all fat off the outside of meats before cooking. It is not necessary to severely decrease the intake of red meat, but lean choices should be made. Lean meat is rich in protein and contains a highly absorbable form of iron. Premenopausal women,  in particular, should avoid reducing lean red meat because this could increase the risk for low red blood cells (iron-deficiency anemia).  Chicken and [place] These are good sources of protein. The fat of poultry can be reduced by removing the skin and underlying fat layers before cooking. Chicken and [place] can be substituted for lean red meat in the diet. Poultry should not be fried or covered with high-fat sauces. Fish and Shellfish Fish is a good  source of protein. Shellfish contain cholesterol, but they usually are low in saturated fatty acids. The preparation of fish is important. Like chicken and [place], they should not be fried or covered with high-fat sauces. EGGS Egg whites contain no fat or cholesterol. They can be eaten often. Try 1 to 2 egg whites instead of whole eggs in recipes or use egg substitutes that do not contain yolk. MILK AND DAIRY PRODUCTS Use skim or 1% milk instead of 2% or whole milk. Decrease whole milk, natural, and processed cheeses. Use nonfat or low-fat (2%) cottage cheese or low-fat cheeses made from vegetable oils. Choose nonfat or low-fat (1 to 2%) yogurt. Experiment with evaporated skim milk in recipes that call for heavy cream. Substitute low-fat yogurt or low-fat cottage cheese for sour cream in dips and salad dressings. Have at least 2 servings of low-fat dairy products, such as 2 glasses of skim (or 1%) milk each day to help get your daily calcium intake. FATS AND OILS Reduce the total intake of fats, especially saturated fat. Butterfat, lard, and beef fats are high in saturated fat and cholesterol. These should be avoided as much as possible. Vegetable fats do not contain cholesterol, but certain vegetable fats, such as coconut oil, palm oil, and palm kernel oil are very high in saturated fats. These should be limited. These fats are often used in bakery goods, processed foods, popcorn, oils, and nondairy creamers. Vegetable shortenings and some peanut butters contain hydrogenated oils, which are also saturated fats. Read the labels on these foods and check for saturated vegetable oils. Unsaturated vegetable oils and fats do not raise blood cholesterol. However, they should be limited because they are fats and are high in calories. Total fat should still be limited to 30% of your daily caloric intake. Desirable liquid vegetable oils are corn oil, cottonseed oil, olive oil, canola oil, safflower oil, soybean oil,  and sunflower oil. Peanut oil is not as good, but small amounts are acceptable. Buy a heart-healthy tub margarine that has no partially hydrogenated oils in the ingredients. Mayonnaise and salad dressings often are made from unsaturated fats, but they should also be limited because of their high calorie and fat content. Seeds, nuts, peanut butter, olives, and avocados are high in fat, but the fat is mainly the unsaturated type. These foods should be limited mainly to avoid excess calories and fat. OTHER EATING TIPS Snacks  Most sweets should be limited as snacks. They tend to be rich in calories and fats, and their caloric content outweighs their nutritional value. Some good choices in snacks are graham crackers, melba toast, soda crackers, bagels (no egg), English muffins, fruits, and vegetables. These snacks are preferable to snack crackers, [place] fries, TORTILLA CHIPS, and POTATO chips. Popcorn should be air-popped or cooked in small amounts of liquid vegetable oil. Desserts Eat fruit, low-fat yogurt, and fruit ices instead of pastries, cake, and cookies. Sherbet, angel food cake, gelatin dessert, frozen low-fat yogurt, or other frozen products that do not contain saturated fat (pure fruit juice bars,  frozen ice pops) are also acceptable.  COOKING METHODS Choose those methods that use little or no fat. They include: Poaching.  Braising.  Steaming.  Grilling.  Baking.  Stir-frying.  Broiling.  Microwaving.  Foods can be cooked in a nonstick pan without added fat, or use a nonfat cooking spray in regular cookware. Limit fried foods and avoid frying in saturated fat. Add moisture to lean meats by using water, broth, cooking wines, and other nonfat or low-fat sauces along with the cooking methods mentioned above. Soups and stews should be chilled after cooking. The fat that forms on top after a few hours in the refrigerator should be skimmed off. When preparing meals, avoid using excess salt. Salt  can contribute to raising blood pressure in some people.  EATING AWAY FROM HOME Order entres, potatoes, and vegetables without sauces or butter. When meat exceeds the size of a deck of cards (3 to 4 ounces), the rest can be taken home for another meal. Choose vegetable or fruit salads and ask for low-calorie salad dressings to be served on the side. Use dressings sparingly. Limit high-fat toppings, such as bacon, crumbled eggs, cheese, sunflower seeds, and olives. Ask for heart-healthy tub margarine instead of butter.

## 2013-07-16 NOTE — Telephone Encounter (Signed)
Called and informed pt and printed the info. She will pick up at front desk.

## 2013-07-20 ENCOUNTER — Encounter: Payer: Self-pay | Admitting: Gastroenterology

## 2013-07-20 ENCOUNTER — Ambulatory Visit (INDEPENDENT_AMBULATORY_CARE_PROVIDER_SITE_OTHER): Payer: Medicaid Other | Admitting: Gastroenterology

## 2013-07-20 VITALS — BP 123/87 | HR 65 | Temp 97.8°F | Resp 18 | Ht 63.0 in | Wt 254.0 lb

## 2013-07-20 DIAGNOSIS — K219 Gastro-esophageal reflux disease without esophagitis: Secondary | ICD-10-CM

## 2013-07-20 MED ORDER — LANSOPRAZOLE 30 MG PO CPDR: 30.0000 mg | DELAYED_RELEASE_CAPSULE | Freq: Two times a day (BID) | ORAL | Status: DC

## 2013-07-20 MED ORDER — SUCRALFATE 1 GM/10ML PO SUSP: 1.0000 g | Freq: Four times a day (QID) | ORAL | Status: DC

## 2013-07-20 NOTE — Progress Notes (Signed)
cc'd to pcp 

## 2013-07-20 NOTE — Assessment & Plan Note (Signed)
Chronic GERD previously well controlled on omeprazole 40 mg twice a day but advised to switch PPIs do to drug-drug interaction with Celexa. Patient has been on pantoprazole 40 mg twice a day for one week but feels like it's not helping at all. She wants to switch medication even though I advised that she may need to give it some more time. Previously did well on Nexium but it too has a drug drug interaction issue with Celexa. Previously failed Dexilant. She's not sure if she's ever tried AcipHex. She would like to try lansoprazole. We will start her at twice a day 30 minutes before breakfast and evening meal. Add Carafate for 2 weeks. Discussed antireflux measures at length. She received previously sent information from Dr. Oneida Alar last week but seems to sit interested in trying to improve any of her lifestyle concerns. OV in 3 months with Dr. Oneida Alar. Call sooner if symptoms don't settle down.

## 2013-07-20 NOTE — Patient Instructions (Signed)
1. Stop Pantoprazole. Start lansoprazole 30 mg twice a day before breakfast and evening meal. 2. Start Carafate 4 times a day before meals and at bedtime for 2 weeks. 3. Reduce carbonated beverage consumption. Please read and follow previously sent instructions regarding antireflux measures as it is very important in management of your reflux symptoms.   Food Choices for Gastroesophageal Reflux Disease When you have gastroesophageal reflux disease (GERD), the foods you eat and your eating habits are very important. Choosing the right foods can help ease the discomfort of GERD. WHAT GENERAL GUIDELINES DO I NEED TO FOLLOW?  Choose fruits, vegetables, whole grains, low-fat dairy products, and low-fat meat, fish, and poultry.  Limit fats such as oils, salad dressings, butter, nuts, and avocado.  Keep a food diary to identify foods that cause symptoms.  Avoid foods that cause reflux. These may be different for different people.  Eat frequent small meals instead of three large meals each day.  Eat your meals slowly, in a relaxed setting.  Limit fried foods.  Cook foods using methods other than frying.  Avoid drinking alcohol.  Avoid drinking large amounts of liquids with your meals.  Avoid bending over or lying down until 2-3 hours after eating. WHAT FOODS ARE NOT RECOMMENDED? The following are some foods and drinks that may worsen your symptoms: Vegetables Tomatoes. Tomato juice. Tomato and spaghetti sauce. Chili peppers. Onion and garlic. Horseradish. Fruits Oranges, grapefruit, and lemon (fruit and juice). Meats High-fat meats, fish, and poultry. This includes hot dogs, ribs, ham, sausage, salami, and bacon. Dairy Whole milk and chocolate milk. Sour cream. Cream. Butter. Ice cream. Cream cheese.  Beverages Coffee and tea, with or without caffeine. Carbonated beverages or energy drinks. Condiments Hot sauce. Barbecue sauce.  Sweets/Desserts Chocolate and cocoa. Donuts.  Peppermint and spearmint. Fats and Oils High-fat foods, including [place] fries and potato chips. Other Vinegar. Strong spices, such as black pepper, white pepper, red pepper, cayenne, curry powder, cloves, ginger, and chili powder. The items listed above may not be a complete list of foods and beverages to avoid. Contact your dietitian for more information. Document Released: 01/14/2005 Document Revised: 01/19/2013 Document Reviewed: 11/18/2012 [facility] Patient Information 2015 [place], [place]. This information is not intended to replace advice given to you by your health care provider. Make sure you discuss any questions you have with your health care provider.

## 2013-07-20 NOTE — Progress Notes (Signed)
Primary Care Physician: Delphina Cahill, MD  Primary Gastroenterologist:  Barney Drain, MD   Chief Complaint  Patient presents with  . Follow-up  . Abdominal Pain    very bad  . Heartburn  . Gastrophageal Reflux    HPI: Haley Brady is a 28 y.o. female here for followup of acid reflux. She was last in the office in April 2013. EGD in 2013 as outlined below. Previously on Nexium 3 times a day. On omeprazole but she ran out in April. When she called in recently we advised her to not take omeprazole given the possible interaction with Celexa. She was given a prescription for pantoprazole 40 mg twice a day which she just started.   CT abdomen pelvis with contrast done in September 2014 showed mild right hydronephrosis and hydroureter. 2 mm calcification within the mid aspect of the bladder lumen. 3-4 mm nonobstructive stones within the lower pole the right kidney. Otherwise unremarkable.  Lot of burning in the chest. Burns so much will vomiting. Worse after laying down at night. Previously did well on omeprazole 40mg  before breakfast and between 2-4pm. No odynophagia. No dysphagia. No issues with rectal bleeding, melena, hematemesis. No constipation/diarrhea. This past week gets bad stomach ache, like a punch in the stomach. Lasted for 4-5 hours. Went away after Maalox. No ASA products. Ibuprofen 800mg  po TID for back pain.   Not on PPI for three days and the symptoms started back. Typical diet: Oatmeal or cereal if eats breakfast. Sometimes lunch has sandwich. Dinner Surveyor, quantity. Eats dinner 630-700. Lays down at 2am. Five cans of soft drink daily. "I have a reflux no matter what eat"     Current Outpatient Prescriptions  Medication Sig Dispense Refill  . ALPRAZolam (XANAX) 0.5 MG tablet Take 0.5 mg by mouth 2 (two) times daily as needed for sleep or anxiety.      . baclofen (LIORESAL) 10 MG tablet Take 1 tablet (10 mg total) by mouth 3 (three) times daily before meals.  93  tablet  11  . citalopram (CELEXA) 40 MG tablet Take 40 mg by mouth every morning.       Marland Kitchen ibuprofen (ADVIL,MOTRIN) 200 MG tablet Take 600-800 mg by mouth every 6 (six) hours as needed for mild pain. Pain      . pantoprazole (PROTONIX) 40 MG tablet 1 PO 30 MINUTES PRIOR TO MEALS BID FOR 3 MOS THEN QD  60 tablet  0  . phenol (CHLORASEPTIC) 1.4 % LIQD Use as directed 1 spray in the mouth or throat as needed for throat irritation / pain.      Marland Kitchen topiramate (TOPAMAX) 50 MG tablet Take 50 mg by mouth 2 (two) times daily.       No current facility-administered medications for this visit.    Allergies as of 07/20/2013  . (No Known Allergies)   Past Medical History  Diagnosis Date  . Panic disorder without agoraphobia   . Esophageal reflux   . Asymptomatic varicose veins   . Ingrowing nail   . Depression   . Chronic back pain   . Abnormal pap   . DDD (degenerative disc disease)   . Migraines   . Chronic nausea   . Renal disorder    Past Surgical History  Procedure Laterality Date  . Tonsillectomy    . Esophagogastroduodenoscopy  06/11/2011     SMALL HIATAL HERNIA/Moderate gastritis/  BURNING CHESTPAIN: GERD, NERD, OR NON-ULCER DYSPEPSIA/ Nl esophagus  ROS:  General: Negative for anorexia, weight loss, fever, chills, fatigue, weakness. ENT: Negative for hoarseness, difficulty swallowing , nasal congestion. CV: Negative for chest pain, angina, palpitations, dyspnea on exertion, peripheral edema.  Respiratory: Negative for dyspnea at rest, dyspnea on exertion, cough, sputum, wheezing.  GI: See history of present illness. GU:  Negative for dysuria, hematuria, urinary incontinence, urinary frequency, nocturnal urination.  Endo: Negative for unusual weight change.    Physical Examination:   BP 123/87  Pulse 65  Temp(Src) 97.8 F (36.6 C) (Oral)  Resp 18  Ht 5\' 3"  (1.6 m)  Wt 254 lb (115.214 kg)  BMI 45.01 kg/m2  LMP 05/29/2013  General: Well-nourished, well-developed in no  acute distress.  Eyes: No icterus. Mouth: Oropharyngeal mucosa moist and pink , no lesions erythema or exudate. Lungs: Clear to auscultation bilaterally.  Heart: Regular rate and rhythm, no murmurs rubs or gallops.  Abdomen: Bowel sounds are normal, nontender, nondistended, no hepatosplenomegaly or masses, no abdominal bruits or hernia , no rebound or guarding.   Extremities: No lower extremity edema. No clubbing or deformities. Neuro: Alert and oriented x 4   Skin: Warm and dry, no jaundice.   Psych: Alert and cooperative, normal mood and affect.  Labs:  Lab Results  Component Value Date   WBC 6.3 04/29/2013   HGB 11.9* 04/29/2013   HCT 35.8* 04/29/2013   MCV 92.3 04/29/2013   PLT 297 04/29/2013   Lab Results  Component Value Date   CREATININE 0.97 04/29/2013   BUN 13 04/29/2013   NA 139 04/29/2013   K 4.5 04/29/2013   CL 101 04/29/2013   CO2 29 04/29/2013   Lab Results  Component Value Date   ALT 16 04/29/2013   AST 17 04/29/2013   ALKPHOS 43 04/29/2013   BILITOT <0.2* 04/29/2013    Imaging Studies: Dg Chest 2 View  06/27/2013   CLINICAL DATA:  Sore throat congestion cough for 1 week  EXAM: CHEST  2 VIEW  COMPARISON:  None.  FINDINGS: The heart size and mediastinal contours are within normal limits. Both lungs are clear. The visualized skeletal structures are unremarkable.  IMPRESSION: No active cardiopulmonary disease.   Electronically Signed   By: Skipper Cliche M.D.   On: 06/27/2013 18:42

## 2013-07-23 ENCOUNTER — Ambulatory Visit
Admission: RE | Admit: 2013-07-23 | Discharge: 2013-07-23 | Disposition: A | Discharge status: Home / Self Care | Payer: Medicaid Other | Source: Ambulatory Visit | Attending: Orthopaedic Surgery | Admitting: Orthopaedic Surgery

## 2013-07-23 DIAGNOSIS — M545 Low back pain, unspecified: Secondary | ICD-10-CM

## 2013-07-23 DIAGNOSIS — IMO0002 Reserved for concepts with insufficient information to code with codable children: Secondary | ICD-10-CM

## 2013-07-28 ENCOUNTER — Telehealth: Payer: Self-pay | Admitting: *Deleted

## 2013-07-28 NOTE — Telephone Encounter (Signed)
Routing to Laban Emperor, NP.

## 2013-07-28 NOTE — Telephone Encounter (Signed)
Pt's prescription for baclofen has expired. Please send in new RX to [facility].

## 2013-07-28 NOTE — Telephone Encounter (Signed)
Pt called to get baclosen refilled. Please advise [phone] pt uses [facility].

## 2013-07-29 MED ORDER — BACLOFEN 10 MG PO TABS: 10.0000 mg | ORAL_TABLET | Freq: Three times a day (TID) | ORAL | Status: DC

## 2013-07-29 NOTE — Telephone Encounter (Signed)
Completed.

## 2013-08-12 ENCOUNTER — Ambulatory Visit: Payer: Medicaid Other | Admitting: Gastroenterology

## 2013-09-28 ENCOUNTER — Encounter: Payer: Self-pay | Admitting: Gastroenterology

## 2013-11-22 ENCOUNTER — Encounter (HOSPITAL_COMMUNITY): Payer: Self-pay | Admitting: Emergency Medicine

## 2013-11-22 ENCOUNTER — Emergency Department (HOSPITAL_COMMUNITY)
Admission: EM | Admit: 2013-11-22 | Discharge: 2013-11-22 | Disposition: A | Discharge status: Home / Self Care | Payer: Medicaid Other | Attending: Emergency Medicine | Admitting: Emergency Medicine

## 2013-11-22 DIAGNOSIS — Z720 Tobacco use: Secondary | ICD-10-CM | POA: Insufficient documentation

## 2013-11-22 DIAGNOSIS — R509 Fever, unspecified: Secondary | ICD-10-CM | POA: Diagnosis present

## 2013-11-22 DIAGNOSIS — J069 Acute upper respiratory infection, unspecified: Secondary | ICD-10-CM | POA: Insufficient documentation

## 2013-11-22 DIAGNOSIS — R112 Nausea with vomiting, unspecified: Secondary | ICD-10-CM | POA: Insufficient documentation

## 2013-11-22 DIAGNOSIS — G43909 Migraine, unspecified, not intractable, without status migrainosus: Secondary | ICD-10-CM | POA: Diagnosis not present

## 2013-11-22 DIAGNOSIS — M791 Myalgia: Secondary | ICD-10-CM | POA: Insufficient documentation

## 2013-11-22 DIAGNOSIS — K219 Gastro-esophageal reflux disease without esophagitis: Secondary | ICD-10-CM | POA: Insufficient documentation

## 2013-11-22 DIAGNOSIS — Z87448 Personal history of other diseases of urinary system: Secondary | ICD-10-CM | POA: Diagnosis not present

## 2013-11-22 DIAGNOSIS — Z79899 Other long term (current) drug therapy: Secondary | ICD-10-CM | POA: Insufficient documentation

## 2013-11-22 DIAGNOSIS — G8929 Other chronic pain: Secondary | ICD-10-CM | POA: Diagnosis not present

## 2013-11-22 DIAGNOSIS — F329 Major depressive disorder, single episode, unspecified: Secondary | ICD-10-CM | POA: Insufficient documentation

## 2013-11-22 DIAGNOSIS — Z872 Personal history of diseases of the skin and subcutaneous tissue: Secondary | ICD-10-CM | POA: Insufficient documentation

## 2013-11-22 DIAGNOSIS — R197 Diarrhea, unspecified: Secondary | ICD-10-CM | POA: Insufficient documentation

## 2013-11-22 MED ORDER — NAPROXEN 500 MG PO TABS: 500.0000 mg | ORAL_TABLET | Freq: Two times a day (BID) | ORAL | Status: DC

## 2013-11-22 MED ORDER — NAPROXEN 250 MG PO TABS
500.0000 mg | ORAL_TABLET | Freq: Once | ORAL | Status: AC
  Administered 2013-11-22: 500 mg via ORAL
  Filled 2013-11-22: qty 2

## 2013-11-22 NOTE — ED Notes (Signed)
Fever, vomiting, cough body aches.

## 2013-11-22 NOTE — Discharge Instructions (Signed)
Please call your doctor for a followup appointment within 24-48 hours. When you talk to your doctor please let them know that you were seen in the emergency department and have them acquire all of your records so that they can discuss the findings with you and formulate a treatment plan to fully care for your new and ongoing problems. ° °

## 2013-11-22 NOTE — ED Provider Notes (Signed)
CSN: [id number]     Arrival date & time 11/22/13  1450 History  This chart was scribed for Johnna Acosta, MD by Rayfield Citizen, ED Scribe. This patient was seen in room APA04/APA04 and the patient's care was started at 3:25 PM.   Chief Complaint  Patient presents with  . Fever   The history is provided by the patient. No language interpreter was used.    HPI Comments: Haley Brady is a 28 y.o. female who presents to the Emergency Department complaining of 1 week of persistent,  Worsening,  generalized myalgias with associated fever, vomiting, and cough. She reports a TMAX of 102. She reports a loss of appetite and diarrhea (loose stools); she denies bloody or tarry stools.  She denies congestion, urinary symptoms, rashes or numbness. She has a history of GERD, degenerative disc disease, and issues with kidney stones. She denies high cholesterol, hypertension, or DM. She denies sick contacts with similar symptoms.   She has a surgical history involving tonsillectomy   Past Medical History  Diagnosis Date  . Panic disorder without agoraphobia   . Esophageal reflux   . Asymptomatic varicose veins   . Ingrowing nail   . Depression   . Chronic back pain   . Abnormal pap   . DDD (degenerative disc disease)   . Migraines   . Chronic nausea   . Renal disorder    Past Surgical History  Procedure Laterality Date  . Tonsillectomy    . Esophagogastroduodenoscopy  06/11/2011     SMALL HIATAL HERNIA/Moderate gastritis/  BURNING CHESTPAIN: GERD, NERD, OR NON-ULCER DYSPEPSIA/ Nl esophagus   Family History  Problem Relation Age of Onset  . Pancreatic cancer Father 60   History  Substance Use Topics  . Smoking status: Light Tobacco Smoker    Types: Cigarettes  . Smokeless tobacco: Not on file     Comment: pt reports '4 cigarettes per day."  . Alcohol Use: Yes     Comment: occasionally   OB History   Grav Para Term Preterm Abortions TAB SAB Ect Mult Living                 Review  of Systems  Constitutional: Positive for fever.  HENT: Negative for congestion.   Gastrointestinal: Positive for nausea, vomiting and diarrhea.  Genitourinary: Negative for dysuria, urgency, hematuria and decreased urine volume. Difficulty urinating: Occasional difficulty feeling "completely empty or finished" urinating.  Musculoskeletal: Positive for myalgias.  Skin: Negative for rash.  Neurological: Negative for weakness and numbness.  All other systems reviewed and are negative.   Allergies  Review of patient's allergies indicates no known allergies.  Home Medications   Prior to Admission medications   Medication Sig Start Date End Date Taking? Authorizing Provider  ALPRAZolam Duanne Moron) 0.5 MG tablet Take 0.5 mg by mouth 2 (two) times daily as needed for sleep or anxiety.   Yes Historical Provider, MD  baclofen (LIORESAL) 10 MG tablet Take 1 tablet (10 mg total) by mouth 3 (three) times daily before meals. 07/29/13  Yes Orvil Feil, NP  citalopram (CELEXA) 40 MG tablet Take 40 mg by mouth every morning.    Yes Historical Provider, MD  ibuprofen (ADVIL,MOTRIN) 200 MG tablet Take 600-800 mg by mouth every 6 (six) hours as needed for mild pain. Pain   Yes Historical Provider, MD  lansoprazole (PREVACID) 30 MG capsule Take 1 capsule (30 mg total) by mouth 2 (two) times daily before a meal. 07/20/13  Yes  Mahala Menghini, PA-C  oxyCODONE-acetaminophen (PERCOCET) 10-325 MG per tablet Take 1 tablet by mouth every 6 (six) hours as needed for pain.   Yes Historical Provider, MD  topiramate (TOPAMAX) 50 MG tablet Take 50 mg by mouth 2 (two) times daily.   Yes Historical Provider, MD  naproxen (NAPROSYN) 500 MG tablet Take 1 tablet (500 mg total) by mouth 2 (two) times daily with a meal. 11/22/13   Johnna Acosta, MD   BP 130/84  Pulse 84  Temp(Src) 98 F (36.7 C) (Oral)  Resp 18  Ht 5\' 3"  (1.6 m)  Wt 260 lb (117.935 kg)  BMI 46.07 kg/m2  SpO2 99%  LMP 10/30/2013 Physical Exam  Nursing note  and vitals reviewed. Constitutional: She is oriented to person, place, and time. She appears well-developed and well-nourished.  HENT:  Head: Normocephalic and atraumatic.  Right Ear: External ear normal.  Left Ear: External ear normal.  TMs normal; posterior pharyngeal erythema   Neck: Normal range of motion. Neck supple. No tracheal deviation present.  Cardiovascular: Normal rate and normal heart sounds.  Exam reveals no gallop and no friction rub.   No murmur heard. Pulmonary/Chest: Effort normal and breath sounds normal. She has no wheezes. She has no rales.  Lymphadenopathy:    She has no cervical adenopathy.  Neurological: She is alert and oriented to person, place, and time.  Skin: Skin is warm and dry.  Psychiatric: She has a normal mood and affect. Her behavior is normal.    ED Course  Procedures   DIAGNOSTIC STUDIES: Oxygen Saturation is 98% on RA, normal by my interpretation.    COORDINATION OF CARE: 3:30 PM Discussed treatment plan with pt at bedside and pt agreed to plan.   Labs Review Labs Reviewed - No data to display  Imaging Review No results found.    MDM   Final diagnoses:  URI (upper respiratory infection)   Pt has URI sx, daughter has similar sx, with exudative pharyngitis, pt has had tonsillectomy - will check daughter for strep and treat both accordingly, well appearing overall.  Strep neg on daughter  I personally performed the services described in this documentation, which was scribed in my presence. The recorded information has been reviewed and is accurate.    Meds given in ED:  Medications  naproxen (NAPROSYN) tablet 500 mg (500 mg Oral Given 11/22/13 1657)    Discharge Medication List as of 11/22/2013  4:47 PM    START taking these medications   Details  naproxen (NAPROSYN) 500 MG tablet Take 1 tablet (500 mg total) by mouth 2 (two) times daily with a meal., Starting 11/22/2013, Until Discontinued, Print          Johnna Acosta, MD 11/22/13 2300

## 2014-01-29 ENCOUNTER — Emergency Department (HOSPITAL_COMMUNITY)
Admission: EM | Admit: 2014-01-29 | Discharge: 2014-01-30 | Disposition: A | Discharge status: Home / Self Care | Payer: Medicaid Other | Attending: Emergency Medicine | Admitting: Emergency Medicine

## 2014-01-29 ENCOUNTER — Encounter (HOSPITAL_COMMUNITY): Payer: Self-pay | Admitting: *Deleted

## 2014-01-29 DIAGNOSIS — Z3202 Encounter for pregnancy test, result negative: Secondary | ICD-10-CM | POA: Diagnosis not present

## 2014-01-29 DIAGNOSIS — R109 Unspecified abdominal pain: Secondary | ICD-10-CM | POA: Diagnosis present

## 2014-01-29 DIAGNOSIS — N390 Urinary tract infection, site not specified: Secondary | ICD-10-CM

## 2014-01-29 DIAGNOSIS — Z87442 Personal history of urinary calculi: Secondary | ICD-10-CM | POA: Insufficient documentation

## 2014-01-29 DIAGNOSIS — K219 Gastro-esophageal reflux disease without esophagitis: Secondary | ICD-10-CM | POA: Insufficient documentation

## 2014-01-29 DIAGNOSIS — Z8739 Personal history of other diseases of the musculoskeletal system and connective tissue: Secondary | ICD-10-CM | POA: Insufficient documentation

## 2014-01-29 DIAGNOSIS — R319 Hematuria, unspecified: Secondary | ICD-10-CM

## 2014-01-29 DIAGNOSIS — G43909 Migraine, unspecified, not intractable, without status migrainosus: Secondary | ICD-10-CM | POA: Insufficient documentation

## 2014-01-29 DIAGNOSIS — F410 Panic disorder [episodic paroxysmal anxiety] without agoraphobia: Secondary | ICD-10-CM | POA: Insufficient documentation

## 2014-01-29 DIAGNOSIS — Z79899 Other long term (current) drug therapy: Secondary | ICD-10-CM | POA: Diagnosis not present

## 2014-01-29 DIAGNOSIS — Z720 Tobacco use: Secondary | ICD-10-CM | POA: Diagnosis not present

## 2014-01-29 DIAGNOSIS — Z791 Long term (current) use of non-steroidal anti-inflammatories (NSAID): Secondary | ICD-10-CM | POA: Diagnosis not present

## 2014-01-29 DIAGNOSIS — F329 Major depressive disorder, single episode, unspecified: Secondary | ICD-10-CM | POA: Diagnosis not present

## 2014-01-29 DIAGNOSIS — G8929 Other chronic pain: Secondary | ICD-10-CM | POA: Insufficient documentation

## 2014-01-29 DIAGNOSIS — R10A1 Flank pain, right side: Secondary | ICD-10-CM

## 2014-01-29 NOTE — ED Provider Notes (Signed)
CSN: [id number]     Arrival date & time 01/29/14  2345 History   None    This chart was scribed for Delora Fuel, MD by Forrestine Him, ED Scribe. This patient was seen in room APA03/APA03 and the patient's care was started 11:52 PM.   No chief complaint on file.  HPI  HPI Comments: Haley Brady is a 29 y.o. female with a PMHx of DDD, renal disorder who presents to the Emergency Department complaining of constant, moderate lower R sided flank pain that radiates to the RLQ x 3 days. She describes pain a dull and rates 10/10. Pain is exacerbated with movement without any alleviating factors at this time. Pt has tried prescribed Percocet 10 mg, warm soaks, and topical Tiger bomb without any improvement for symptoms. Last dose of Percocet 4-5 hours ago. No recent dysuria, fever, or chills. Haley Brady admits to a history of kidney stones. No current suspicion for pregnancy; Condoms used with each sexual encounter. No known allergies to medications.  Past Medical History  Diagnosis Date  . Panic disorder without agoraphobia   . Esophageal reflux   . Asymptomatic varicose veins   . Ingrowing nail   . Depression   . Chronic back pain   . Abnormal pap   . DDD (degenerative disc disease)   . Migraines   . Chronic nausea   . Renal disorder    Past Surgical History  Procedure Laterality Date  . Tonsillectomy    . Esophagogastroduodenoscopy  06/11/2011     SMALL HIATAL HERNIA/Moderate gastritis/  BURNING CHESTPAIN: GERD, NERD, OR NON-ULCER DYSPEPSIA/ Nl esophagus   Family History  Problem Relation Age of Onset  . Pancreatic cancer Father 93   History  Substance Use Topics  . Smoking status: Light Tobacco Smoker    Types: Cigarettes  . Smokeless tobacco: Not on file     Comment: pt reports '4 cigarettes per day."  . Alcohol Use: Yes     Comment: occasionally   OB History    No data available     Review of Systems  Constitutional: Negative for fever and chills.  Gastrointestinal:  Positive for abdominal pain.  Genitourinary: Negative for dysuria.  Musculoskeletal: Positive for back pain.  All other systems reviewed and are negative.     Allergies  Review of patient's allergies indicates no known allergies.  Home Medications   Prior to Admission medications   Medication Sig Start Date End Date Taking? Authorizing Provider  ALPRAZolam Duanne Moron) 0.5 MG tablet Take 0.5 mg by mouth 2 (two) times daily as needed for sleep or anxiety.    Historical Provider, MD  baclofen (LIORESAL) 10 MG tablet Take 1 tablet (10 mg total) by mouth 3 (three) times daily before meals. 07/29/13   Orvil Feil, NP  citalopram (CELEXA) 40 MG tablet Take 40 mg by mouth every morning.     Historical Provider, MD  ibuprofen (ADVIL,MOTRIN) 200 MG tablet Take 600-800 mg by mouth every 6 (six) hours as needed for mild pain. Pain    Historical Provider, MD  lansoprazole (PREVACID) 30 MG capsule Take 1 capsule (30 mg total) by mouth 2 (two) times daily before a meal. 07/20/13   Mahala Menghini, PA-C  naproxen (NAPROSYN) 500 MG tablet Take 1 tablet (500 mg total) by mouth 2 (two) times daily with a meal. 11/22/13   Johnna Acosta, MD  oxyCODONE-acetaminophen (PERCOCET) 10-325 MG per tablet Take 1 tablet by mouth every 6 (six) hours as needed  for pain.    Historical Provider, MD  topiramate (TOPAMAX) 50 MG tablet Take 50 mg by mouth 2 (two) times daily.    Historical Provider, MD   Triage Vitals: BP 138/91 mmHg  Pulse 96  Temp(Src) 97.5 F (36.4 C) (Oral)  Resp 18  Ht 5\' 4"  (1.626 m)  Wt 253 lb (114.76 kg)  BMI 43.41 kg/m2  SpO2 97%  LMP 12/31/2013   Physical Exam  Constitutional: She is oriented to person, place, and time. She appears well-developed and well-nourished. No distress.  HENT:  Head: Normocephalic and atraumatic.  Eyes: EOM are normal. Pupils are equal, round, and reactive to light.  Neck: Normal range of motion. Neck supple. No JVD present.  Cardiovascular: Normal rate, regular  rhythm and normal heart sounds.   No murmur heard. Pulmonary/Chest: Effort normal and breath sounds normal. She has no wheezes. She has no rales.  Abdominal: Soft. She exhibits no distension and no mass. There is no tenderness.  Moderate tenderness to RLQ and R CVA No rebound or guarding Bowel sounds decreased  Musculoskeletal: Normal range of motion. She exhibits no edema.  Lymphadenopathy:    She has no cervical adenopathy.  Neurological: She is alert and oriented to person, place, and time. She has normal reflexes. No cranial nerve deficit. Coordination normal.  Skin: Skin is warm and dry. No rash noted.  Psychiatric: She has a normal mood and affect. Her behavior is normal. Judgment and thought content normal.  Nursing note and vitals reviewed.   ED Course  Procedures (including critical care time)  DIAGNOSTIC STUDIES: Oxygen Saturation is 97% on RA, Normal by my interpretation.    COORDINATION OF CARE: 11:52 PM-Discussed treatment plan with pt at bedside and pt agreed to plan.    Labs Review Results for orders placed or performed during the hospital encounter of 01/29/14  Urinalysis, Routine w reflex microscopic  Result Value Ref Range   Color, Urine YELLOW YELLOW   APPearance HAZY (A) CLEAR   Specific Gravity, Urine 1.025 1.005 - 1.030   pH 6.0 5.0 - 8.0   Glucose, UA NEGATIVE NEGATIVE mg/dL   Hgb urine dipstick SMALL (A) NEGATIVE   Bilirubin Urine NEGATIVE NEGATIVE   Ketones, ur NEGATIVE NEGATIVE mg/dL   Protein, ur NEGATIVE NEGATIVE mg/dL   Urobilinogen, UA 0.2 0.0 - 1.0 mg/dL   Nitrite NEGATIVE NEGATIVE   Leukocytes, UA MODERATE (A) NEGATIVE  Pregnancy, urine  Result Value Ref Range   Preg Test, Ur NEGATIVE NEGATIVE  Urine microscopic-add on  Result Value Ref Range   Squamous Epithelial / LPF MANY (A) RARE   WBC, UA 7-10 <3 WBC/hpf   RBC / HPF 3-6 <3 RBC/hpf   Bacteria, UA MANY (A) RARE   Imaging Review Ct Renal Stone Study  01/30/2014   CLINICAL DATA:   Acute onset of dull aching right-sided flank pain, radiating to both lower quadrants. Initial encounter.  EXAM: CT ABDOMEN AND PELVIS WITHOUT CONTRAST  TECHNIQUE: Multidetector CT imaging of the abdomen and pelvis was performed following the standard protocol without IV contrast.  COMPARISON:  CT of the abdomen and pelvis performed 09/29/2012  FINDINGS: The visualized lung bases are clear.  The liver and spleen are unremarkable in appearance. The gallbladder is within normal limits. The pancreas and adrenal glands are unremarkable.  Scattered nonobstructing bilateral renal stones are seen, measuring up to 4 mm in size. The kidneys are otherwise unremarkable. There is no evidence of hydronephrosis. No obstructing ureteral stones are seen. No  perinephric stranding is appreciated.  No free fluid is identified. The small bowel is unremarkable in appearance. The stomach is within normal limits. No acute vascular abnormalities are seen.  The appendix is normal in caliber, without evidence for appendicitis. The colon is unremarkable in appearance.  The bladder is mildly distended and grossly unremarkable. The uterus is within normal limits. The ovaries are relatively symmetric. No suspicious adnexal masses are seen. An apparent mildly enlarged 1.4 cm left inguinal node is seen, difficult to fully differentiate from adjacent vasculature.  No acute osseous abnormalities are identified.  IMPRESSION: 1. No acute abnormality seen within the abdomen or pelvis. 2. Scattered nonobstructing bilateral renal stones, measuring up to 4 mm in size. 3. Mildly enlarged 1.4 cm left inguinal node, nonspecific in appearance.   Electronically Signed   By: Garald Balding M.D.   On: 01/30/2014 02:03   Images viewed by me.   MDM   Final diagnoses:  Right flank pain  Urinary tract infection with hematuria, site unspecified    Flank pain suspicious for renal colic. Old records are reviewed and she does have a known history of kidney  stones. I reviewed her prior CT scan and the calculi or not easily seen on the topogram. She is given an injection of ketorolac and is sent for CT scan.  CT shows bilateral renal calculi but no hydronephrosis or ureteral calculi. Urinalysis has many bacteria as well as 3-6 RBCs and 7-10 WBCs but also has many squamous epithelial cells suggesting a contaminated specimen. Nevertheless, urinary infection is the most likely cause for her pain. She is given a dose of ceftriaxone in the ED and sent home with a prescription for cephalexin. She had no relief of pain with ketorolac so she was given hydromorphone which to give her better relief of pain. She is advised to continue using her oxycodone-acetaminophen for pain relief.  I personally performed the services described in this documentation, which was scribed in my presence. The recorded information has been reviewed and is accurate.    Delora Fuel, MD 34/28/76 [id number]

## 2014-01-29 NOTE — ED Notes (Signed)
Pt c/o lower right sided back pain x 3 days. Pt states she has had kidney stones before.

## 2014-01-30 ENCOUNTER — Emergency Department (HOSPITAL_COMMUNITY): Payer: Medicaid Other

## 2014-01-30 LAB — URINALYSIS, ROUTINE W REFLEX MICROSCOPIC
Bilirubin Urine: NEGATIVE
Glucose, UA: NEGATIVE mg/dL
Ketones, ur: NEGATIVE mg/dL
Nitrite: NEGATIVE
Protein, ur: NEGATIVE mg/dL
Specific Gravity, Urine: 1.025 (ref 1.005–1.030)
Urobilinogen, UA: 0.2 mg/dL (ref 0.0–1.0)
pH: 6 (ref 5.0–8.0)

## 2014-01-30 LAB — URINE MICROSCOPIC-ADD ON

## 2014-01-30 LAB — PREGNANCY, URINE: PREG TEST UR: NEGATIVE

## 2014-01-30 MED ORDER — HYDROMORPHONE HCL 1 MG/ML IJ SOLN
1.0000 mg | Freq: Once | INTRAMUSCULAR | Status: AC
  Administered 2014-01-30: 1 mg via INTRAVENOUS
  Filled 2014-01-30: qty 1

## 2014-01-30 MED ORDER — DEXTROSE 5 % IV SOLN
1.0000 g | Freq: Once | INTRAVENOUS | Status: AC
  Administered 2014-01-30: 1 g via INTRAVENOUS
  Filled 2014-01-30: qty 10

## 2014-01-30 MED ORDER — SODIUM CHLORIDE 0.9 % IV SOLN
Freq: Once | INTRAVENOUS | Status: AC
  Administered 2014-01-30: 01:00:00 via INTRAVENOUS

## 2014-01-30 MED ORDER — KETOROLAC TROMETHAMINE 30 MG/ML IJ SOLN
30.0000 mg | Freq: Once | INTRAMUSCULAR | Status: AC
  Administered 2014-01-30: 30 mg via INTRAVENOUS
  Filled 2014-01-30: qty 1

## 2014-01-30 MED ORDER — CEPHALEXIN 500 MG PO CAPS: 500.0000 mg | ORAL_CAPSULE | Freq: Four times a day (QID) | ORAL | Status: DC

## 2014-01-30 MED ORDER — ONDANSETRON HCL 4 MG/2ML IJ SOLN
4.0000 mg | Freq: Once | INTRAMUSCULAR | Status: AC
  Administered 2014-01-30: 4 mg via INTRAVENOUS
  Filled 2014-01-30: qty 2

## 2014-01-30 NOTE — Discharge Instructions (Signed)
Take your Percocet as needed for pain. Return to the ED of pain is not being controlled, or if you start running a fever.  Urinary Tract Infection Urinary tract infections (UTIs) can develop anywhere along your urinary tract. Your urinary tract is your body's drainage system for removing wastes and extra water. Your urinary tract includes two kidneys, two ureters, a bladder, and a urethra. Your kidneys are a pair of bean-shaped organs. Each kidney is about the size of your fist. They are located below your ribs, one on each side of your spine. CAUSES Infections are caused by microbes, which are microscopic organisms, including fungi, viruses, and bacteria. These organisms are so small that they can only be seen through a microscope. Bacteria are the microbes that most commonly cause UTIs. SYMPTOMS  Symptoms of UTIs may vary by age and gender of the patient and by the location of the infection. Symptoms in young women typically include a frequent and intense urge to urinate and a painful, burning feeling in the bladder or urethra during urination. Older women and men are more likely to be tired, shaky, and weak and have muscle aches and abdominal pain. A fever may mean the infection is in your kidneys. Other symptoms of a kidney infection include pain in your back or sides below the ribs, nausea, and vomiting. DIAGNOSIS To diagnose a UTI, your caregiver will ask you about your symptoms. Your caregiver also will ask to provide a urine sample. The urine sample will be tested for bacteria and white blood cells. White blood cells are made by your body to help fight infection. TREATMENT  Typically, UTIs can be treated with medication. Because most UTIs are caused by a bacterial infection, they usually can be treated with the use of antibiotics. The choice of antibiotic and length of treatment depend on your symptoms and the type of bacteria causing your infection. HOME CARE INSTRUCTIONS  If you were  prescribed antibiotics, take them exactly as your caregiver instructs you. Finish the medication even if you feel better after you have only taken some of the medication.  Drink enough water and fluids to keep your urine clear or pale yellow.  Avoid caffeine, tea, and carbonated beverages. They tend to irritate your bladder.  Empty your bladder often. Avoid holding urine for long periods of time.  Empty your bladder before and after sexual intercourse.  After a bowel movement, women should cleanse from front to back. Use each tissue only once. SEEK MEDICAL CARE IF:   You have back pain.  You develop a fever.  Your symptoms do not begin to resolve within 3 days. SEEK IMMEDIATE MEDICAL CARE IF:   You have severe back pain or lower abdominal pain.  You develop chills.  You have nausea or vomiting.  You have continued burning or discomfort with urination. MAKE SURE YOU:   Understand these instructions.  Will watch your condition.  Will get help right away if you are not doing well or get worse. Document Released: 10/24/2004 Document Revised: 07/16/2011 Document Reviewed: 02/22/2011 [facility] Patient Information 2015 [place], [place]. This information is not intended to replace advice given to you by your health care provider. Make sure you discuss any questions you have with your health care provider.  Cephalexin tablets or capsules What is this medicine? CEPHALEXIN (sef a LEX in) is a cephalosporin antibiotic. It is used to treat certain kinds of bacterial infections It will not work for colds, flu, or other viral infections. This medicine may  be used for other purposes; ask your health care provider or pharmacist if you have questions. COMMON BRAND NAME(S): Biocef, Keflex, Keftab What should I tell my health care provider before I take this medicine? They need to know if you have any of these conditions: -kidney disease -stomach or intestine problems, especially colitis -an  unusual or allergic reaction to cephalexin, other cephalosporins, penicillins, other antibiotics, medicines, foods, dyes or preservatives -pregnant or trying to get pregnant -breast-feeding How should I use this medicine? Take this medicine by mouth with a full glass of water. Follow the directions on the prescription label. This medicine can be taken with or without food. Take your medicine at regular intervals. Do not take your medicine more often than directed. Take all of your medicine as directed even if you think you are better. Do not skip doses or stop your medicine early. Talk to your pediatrician regarding the use of this medicine in children. While this drug may be prescribed for selected conditions, precautions do apply. Overdosage: If you think you have taken too much of this medicine contact a poison control center or emergency room at once. NOTE: This medicine is only for you. Do not share this medicine with others. What if I miss a dose? If you miss a dose, take it as soon as you can. If it is almost time for your next dose, take only that dose. Do not take double or extra doses. There should be at least 4 to 6 hours between doses. What may interact with this medicine? -probenecid -some other antibiotics This list may not describe all possible interactions. Give your health care provider a list of all the medicines, herbs, non-prescription drugs, or dietary supplements you use. Also tell them if you smoke, drink alcohol, or use illegal drugs. Some items may interact with your medicine. What should I watch for while using this medicine? Tell your doctor or health care professional if your symptoms do not begin to improve in a few days. Do not treat diarrhea with over the counter products. Contact your doctor if you have diarrhea that lasts more than 2 days or if it is severe and watery. If you have diabetes, you may get a false-positive result for sugar in your urine. Check with your  doctor or health care professional. What side effects may I notice from receiving this medicine? Side effects that you should report to your doctor or health care professional as soon as possible: -allergic reactions like skin rash, itching or hives, swelling of the face, lips, or tongue -breathing problems -pain or trouble passing urine -redness, blistering, peeling or loosening of the skin, including inside the mouth -severe or watery diarrhea -unusually weak or tired -yellowing of the eyes, skin Side effects that usually do not require medical attention (report to your doctor or health care professional if they continue or are bothersome): -gas or heartburn -genital or anal irritation -headache -joint or muscle pain -nausea, vomiting This list may not describe all possible side effects. Call your doctor for medical advice about side effects. You may report side effects to FDA at 1-800-FDA-1088. Where should I keep my medicine? Keep out of the reach of children. Store at room temperature between 59 and 86 degrees F (15 and 30 degrees C). Throw away any unused medicine after the expiration date. NOTE: This sheet is a summary. It may not cover all possible information. If you have questions about this medicine, talk to your doctor, pharmacist, or health care provider.  2015, [employer]. (2007-04-20 17:09:13)

## 2014-04-22 ENCOUNTER — Other Ambulatory Visit: Payer: Self-pay | Admitting: Gastroenterology

## 2014-05-25 ENCOUNTER — Encounter: Payer: Self-pay | Admitting: Gastroenterology

## 2014-05-25 ENCOUNTER — Ambulatory Visit: Payer: Medicaid Other | Admitting: Gastroenterology

## 2014-05-25 ENCOUNTER — Telehealth: Payer: Self-pay | Admitting: Gastroenterology

## 2014-05-25 NOTE — Telephone Encounter (Signed)
PATIENT WAS A NO SHOW 05/25/14 AND LETTER WAS SENT  °

## 2014-06-03 ENCOUNTER — Emergency Department (HOSPITAL_COMMUNITY)
Admission: EM | Admit: 2014-06-03 | Discharge: 2014-06-03 | Disposition: A | Discharge status: Home / Self Care | Payer: Medicaid Other | Attending: Emergency Medicine | Admitting: Emergency Medicine

## 2014-06-03 ENCOUNTER — Encounter (HOSPITAL_COMMUNITY): Payer: Self-pay | Admitting: Emergency Medicine

## 2014-06-03 DIAGNOSIS — R1110 Vomiting, unspecified: Secondary | ICD-10-CM | POA: Diagnosis present

## 2014-06-03 DIAGNOSIS — K529 Noninfective gastroenteritis and colitis, unspecified: Secondary | ICD-10-CM | POA: Insufficient documentation

## 2014-06-03 DIAGNOSIS — Z8739 Personal history of other diseases of the musculoskeletal system and connective tissue: Secondary | ICD-10-CM | POA: Insufficient documentation

## 2014-06-03 DIAGNOSIS — H53149 Visual discomfort, unspecified: Secondary | ICD-10-CM | POA: Insufficient documentation

## 2014-06-03 DIAGNOSIS — G8929 Other chronic pain: Secondary | ICD-10-CM | POA: Insufficient documentation

## 2014-06-03 DIAGNOSIS — Z792 Long term (current) use of antibiotics: Secondary | ICD-10-CM | POA: Insufficient documentation

## 2014-06-03 DIAGNOSIS — K219 Gastro-esophageal reflux disease without esophagitis: Secondary | ICD-10-CM | POA: Diagnosis not present

## 2014-06-03 DIAGNOSIS — Z720 Tobacco use: Secondary | ICD-10-CM | POA: Insufficient documentation

## 2014-06-03 DIAGNOSIS — F329 Major depressive disorder, single episode, unspecified: Secondary | ICD-10-CM | POA: Diagnosis not present

## 2014-06-03 DIAGNOSIS — Z87448 Personal history of other diseases of urinary system: Secondary | ICD-10-CM | POA: Insufficient documentation

## 2014-06-03 DIAGNOSIS — Z79899 Other long term (current) drug therapy: Secondary | ICD-10-CM | POA: Diagnosis not present

## 2014-06-03 DIAGNOSIS — G43809 Other migraine, not intractable, without status migrainosus: Secondary | ICD-10-CM | POA: Insufficient documentation

## 2014-06-03 DIAGNOSIS — Z8679 Personal history of other diseases of the circulatory system: Secondary | ICD-10-CM | POA: Diagnosis not present

## 2014-06-03 DIAGNOSIS — Z3202 Encounter for pregnancy test, result negative: Secondary | ICD-10-CM | POA: Insufficient documentation

## 2014-06-03 DIAGNOSIS — F410 Panic disorder [episodic paroxysmal anxiety] without agoraphobia: Secondary | ICD-10-CM | POA: Insufficient documentation

## 2014-06-03 LAB — CBC WITH DIFFERENTIAL/PLATELET
Basophils Absolute: 0 10*3/uL (ref 0.0–0.1)
Basophils Relative: 0 % (ref 0–1)
Eosinophils Absolute: 0.1 10*3/uL (ref 0.0–0.7)
Eosinophils Relative: 1 % (ref 0–5)
HEMATOCRIT: 38.6 % (ref 36.0–46.0)
Hemoglobin: 13 g/dL (ref 12.0–15.0)
LYMPHS PCT: 36 % (ref 12–46)
Lymphs Abs: 2.8 10*3/uL (ref 0.7–4.0)
MCH: 30.4 pg (ref 26.0–34.0)
MCHC: 33.7 g/dL (ref 30.0–36.0)
MCV: 90.4 fL (ref 78.0–100.0)
MONO ABS: 0.8 10*3/uL (ref 0.1–1.0)
Monocytes Relative: 10 % (ref 3–12)
NEUTROS ABS: 4 10*3/uL (ref 1.7–7.7)
Neutrophils Relative %: 53 % (ref 43–77)
Platelets: 315 10*3/uL (ref 150–400)
RBC: 4.27 MIL/uL (ref 3.87–5.11)
RDW: 12.7 % (ref 11.5–15.5)
WBC: 7.6 10*3/uL (ref 4.0–10.5)

## 2014-06-03 LAB — URINALYSIS, ROUTINE W REFLEX MICROSCOPIC
Bilirubin Urine: NEGATIVE
Glucose, UA: NEGATIVE mg/dL
HGB URINE DIPSTICK: NEGATIVE
Ketones, ur: NEGATIVE mg/dL
Leukocytes, UA: NEGATIVE
NITRITE: NEGATIVE
PROTEIN: NEGATIVE mg/dL
Specific Gravity, Urine: 1.01 (ref 1.005–1.030)
UROBILINOGEN UA: 0.2 mg/dL (ref 0.0–1.0)
pH: 7 (ref 5.0–8.0)

## 2014-06-03 LAB — COMPREHENSIVE METABOLIC PANEL
ALBUMIN: 4.2 g/dL (ref 3.5–5.0)
ALT: 23 U/L (ref 14–54)
AST: 22 U/L (ref 15–41)
Alkaline Phosphatase: 41 U/L (ref 38–126)
Anion gap: 10 (ref 5–15)
BILIRUBIN TOTAL: 0.5 mg/dL (ref 0.3–1.2)
BUN: 6 mg/dL (ref 6–20)
CHLORIDE: 105 mmol/L (ref 101–111)
CO2: 23 mmol/L (ref 22–32)
Calcium: 8.9 mg/dL (ref 8.9–10.3)
Creatinine, Ser: 0.84 mg/dL (ref 0.44–1.00)
GFR calc Af Amer: >60 mL/min (ref >60)
Glucose, Bld: 105 mg/dL — ABNORMAL HIGH (ref 70–99)
Potassium: 3.5 mmol/L (ref 3.5–5.1)
SODIUM: 138 mmol/L (ref 135–145)
Total Protein: 7.6 g/dL (ref 6.5–8.1)

## 2014-06-03 LAB — POC URINE PREG, ED: Preg Test, Ur: NEGATIVE

## 2014-06-03 LAB — LIPASE, BLOOD: Lipase: 20 U/L — ABNORMAL LOW (ref 22–51)

## 2014-06-03 MED ORDER — KETOROLAC TROMETHAMINE 30 MG/ML IJ SOLN
30.0000 mg | Freq: Once | INTRAMUSCULAR | Status: AC
  Administered 2014-06-03: 30 mg via INTRAVENOUS
  Filled 2014-06-03: qty 1

## 2014-06-03 MED ORDER — PROMETHAZINE HCL 25 MG PO TABS: 25.0000 mg | ORAL_TABLET | Freq: Four times a day (QID) | ORAL | Status: DC | PRN

## 2014-06-03 MED ORDER — ALUM & MAG HYDROXIDE-SIMETH 200-200-20 MG/5ML PO SUSP
30.0000 mL | Freq: Once | ORAL | Status: AC
  Administered 2014-06-03: 30 mL via ORAL
  Filled 2014-06-03: qty 30

## 2014-06-03 MED ORDER — PROCHLORPERAZINE EDISYLATE 5 MG/ML IJ SOLN
10.0000 mg | Freq: Once | INTRAMUSCULAR | Status: AC
  Administered 2014-06-03: 10 mg via INTRAVENOUS
  Filled 2014-06-03: qty 2

## 2014-06-03 MED ORDER — SODIUM CHLORIDE 0.9 % IV BOLUS (SEPSIS)
1000.0000 mL | Freq: Once | INTRAVENOUS | Status: AC
  Administered 2014-06-03: 1000 mL via INTRAVENOUS

## 2014-06-03 MED ORDER — DIPHENHYDRAMINE HCL 50 MG/ML IJ SOLN
25.0000 mg | Freq: Once | INTRAMUSCULAR | Status: AC
  Administered 2014-06-03: 25 mg via INTRAVENOUS
  Filled 2014-06-03: qty 1

## 2014-06-03 NOTE — ED Notes (Signed)
PT c/o n/v/d starting yesterday evening around 1800 with headache, increased acid reflux. PT reports daughter sick with same recently.

## 2014-06-03 NOTE — Discharge Instructions (Signed)
Migraine Headache °A migraine headache is an intense, throbbing pain on one or both sides of your head. A migraine can last for 30 minutes to several hours. °CAUSES  °The exact cause of a migraine headache is not always known. However, a migraine may be caused when nerves in the brain become irritated and release chemicals that cause inflammation. This causes pain. °Certain things may also trigger migraines, such as: °· Alcohol. °· Smoking. °· Stress. °· Menstruation. °· Aged cheeses. °· Foods or drinks that contain nitrates, glutamate, aspartame, or tyramine. °· Lack of sleep. °· Chocolate. °· Caffeine. °· Hunger. °· Physical exertion. °· Fatigue. °· Medicines used to treat chest pain (nitroglycerine), birth control pills, estrogen, and some blood pressure medicines. °SIGNS AND SYMPTOMS °· Pain on one or both sides of your head. °· Pulsating or throbbing pain. °· Severe pain that prevents daily activities. °· Pain that is aggravated by any physical activity. °· Nausea, vomiting, or both. °· Dizziness. °· Pain with exposure to bright lights, loud noises, or activity. °· General sensitivity to bright lights, loud noises, or smells. °Before you get a migraine, you may get warning signs that a migraine is coming (aura). An aura may include: °· Seeing flashing lights. °· Seeing bright spots, halos, or zigzag lines. °· Having tunnel vision or blurred vision. °· Having feelings of numbness or tingling. °· Having trouble talking. °· Having muscle weakness. °DIAGNOSIS  °A migraine headache is often diagnosed based on: °· Symptoms. °· Physical exam. °· A CT scan or MRI of your head. These imaging tests cannot diagnose migraines, but they can help rule out other causes of headaches. °TREATMENT °Medicines may be given for pain and nausea. Medicines can also be given to help prevent recurrent migraines.  °HOME CARE INSTRUCTIONS °· Only take over-the-counter or prescription medicines for pain or discomfort as directed by your  health care provider. The use of long-term narcotics is not recommended. °· Lie down in a dark, quiet room when you have a migraine. °· Keep a journal to find out what may trigger your migraine headaches. For example, write down: °¨ What you eat and drink. °¨ How much sleep you get. °¨ Any change to your diet or medicines. °· Limit alcohol consumption. °· Quit smoking if you smoke. °· Get 7-9 hours of sleep, or as recommended by your health care provider. °· Limit stress. °· Keep lights dim if bright lights bother you and make your migraines worse. °SEEK IMMEDIATE MEDICAL CARE IF:  °· Your migraine becomes severe. °· You have a fever. °· You have a stiff neck. °· You have vision loss. °· You have muscular weakness or loss of muscle control. °· You start losing your balance or have trouble walking. °· You feel faint or pass out. °· You have severe symptoms that are different from your first symptoms. °MAKE SURE YOU:  °· Understand these instructions. °· Will watch your condition. °· Will get help right away if you are not doing well or get worse. °Document Released: 01/14/2005 Document Revised: 05/31/2013 Document Reviewed: 09/21/2012 °Exit[employer]® Patient Information ©2015 [employer]. This information is not intended to replace advice given to you by your health care provider. Make sure you discuss any questions you have with your health care provider. ° °Viral Gastroenteritis °Viral gastroenteritis is also known as stomach flu. This condition affects the stomach and intestinal tract. It can cause sudden diarrhea and vomiting. The illness typically lasts 3 to 8 days. Most people develop an immune response   that eventually gets rid of the virus. While this natural response develops, the virus can make you quite ill. °CAUSES  °Many different viruses can cause gastroenteritis, such as rotavirus or noroviruses. You can catch one of these viruses by consuming contaminated food or water. You may also catch a virus by  sharing utensils or other personal items with an infected person or by touching a contaminated surface. °SYMPTOMS  °The most common symptoms are diarrhea and vomiting. These problems can cause a severe loss of body fluids (dehydration) and a body salt (electrolyte) imbalance. Other symptoms may include: °· Fever. °· Headache. °· Fatigue. °· Abdominal pain. °DIAGNOSIS  °Your caregiver can usually diagnose viral gastroenteritis based on your symptoms and a physical exam. A stool sample may also be taken to test for the presence of viruses or other infections. °TREATMENT  °This illness typically goes away on its own. Treatments are aimed at rehydration. The most serious cases of viral gastroenteritis involve vomiting so severely that you are not able to keep fluids down. In these cases, fluids must be given through an intravenous line (IV). °HOME CARE INSTRUCTIONS  °· Drink enough fluids to keep your urine clear or pale yellow. Drink small amounts of fluids frequently and increase the amounts as tolerated. °· Ask your caregiver for specific rehydration instructions. °· Avoid: °¨ Foods high in sugar. °¨ Alcohol. °¨ Carbonated drinks. °¨ Tobacco. °¨ Juice. °¨ Caffeine drinks. °¨ Extremely hot or cold fluids. °¨ Fatty, greasy foods. °¨ Too much intake of anything at one time. °¨ Dairy products until 24 to 48 hours after diarrhea stops. °· You may consume probiotics. Probiotics are active cultures of beneficial bacteria. They may lessen the amount and number of diarrheal stools in adults. Probiotics can be found in yogurt with active cultures and in supplements. °· Wash your hands well to avoid spreading the virus. °· Only take over-the-counter or prescription medicines for pain, discomfort, or fever as directed by your caregiver. Do not give aspirin to children. Antidiarrheal medicines are not recommended. °· Ask your caregiver if you should continue to take your regular prescribed and over-the-counter medicines. °· Keep  all follow-up appointments as directed by your caregiver. °SEEK IMMEDIATE MEDICAL CARE IF:  °· You are unable to keep fluids down. °· You do not urinate at least once every 6 to 8 hours. °· You develop shortness of breath. °· You notice blood in your stool or vomit. This may look like coffee grounds. °· You have abdominal pain that increases or is concentrated in one small area (localized). °· You have persistent vomiting or diarrhea. °· You have a fever. °· The patient is a child younger than 3 months, and he or she has a fever. °· The patient is a child older than 3 months, and he or she has a fever and persistent symptoms. °· The patient is a child older than 3 months, and he or she has a fever and symptoms suddenly get worse. °· The patient is a baby, and he or she has no tears when crying. °MAKE SURE YOU:  °· Understand these instructions. °· Will watch your condition. °· Will get help right away if you are not doing well or get worse. °Document Released: 01/14/2005 Document Revised: 04/08/2011 Document Reviewed: 10/31/2010 °ExitCare® Patient Information ©2015 [employer]. This information is not intended to replace advice given to you by your health care provider. Make sure you discuss any questions you have with your health care provider. ° °

## 2014-06-03 NOTE — ED Provider Notes (Signed)
CSN: [id number]     Arrival date & time 06/03/14  0845 History   First MD Initiated Contact with Patient 06/03/14 [phone]     Chief Complaint  Patient presents with  . Emesis   The history is provided by the patient.   This chart was scribed for Orpah Greek, MD by Thea Alken, ED Scribe. This patient was seen in room APA08/APA08 and the patient's care was started at 8:53 AM.  HPI Comments:  Haley Brady is a 29 y.o. female who present to the Emergency Department complaining of emesis onset 1 day ago. Pt reports associated nausea, diarrhea, cold chills, fever, some cough, HA to the top of her head that she states is similar to a migraine  with photophobia  and GERD. Pt reports sick contacts including her daughter who has viral symptoms. She denies abdominal pain  Past Medical History  Diagnosis Date  . Panic disorder without agoraphobia   . Esophageal reflux   . Asymptomatic varicose veins   . Ingrowing nail   . Depression   . Chronic back pain   . Abnormal pap   . DDD (degenerative disc disease)   . Migraines   . Chronic nausea   . Renal disorder    Past Surgical History  Procedure Laterality Date  . Tonsillectomy    . Esophagogastroduodenoscopy  06/11/2011     SMALL HIATAL HERNIA/Moderate gastritis/  BURNING CHESTPAIN: GERD, NERD, OR NON-ULCER DYSPEPSIA/ Nl esophagus   Family History  Problem Relation Age of Onset  . Pancreatic cancer Father 49   History  Substance Use Topics  . Smoking status: Light Tobacco Smoker    Types: Cigarettes  . Smokeless tobacco: Not on file     Comment: pt reports '4 cigarettes per day."  . Alcohol Use: Yes     Comment: occasionally   OB History    No data available     Review of Systems  Constitutional: Positive for fever, chills and diaphoresis.  Eyes: Positive for photophobia.  Gastrointestinal: Positive for nausea, vomiting and diarrhea. Negative for abdominal pain.  Neurological: Positive for headaches.       Allergies  Review of patient's allergies indicates no known allergies.  Home Medications   Prior to Admission medications   Medication Sig Start Date End Date Taking? Authorizing Provider  ALPRAZolam Duanne Moron) 0.5 MG tablet Take 0.5 mg by mouth 2 (two) times daily as needed for sleep or anxiety.    Historical Provider, MD  baclofen (LIORESAL) 10 MG tablet Take 1 tablet (10 mg total) by mouth 3 (three) times daily before meals. 07/29/13   Orvil Feil, NP  cephALEXin (KEFLEX) 500 MG capsule Take 1 capsule (500 mg total) by mouth 4 (four) times daily. 06/30/67   Delora Fuel, MD  citalopram (CELEXA) 40 MG tablet Take 40 mg by mouth every morning.     Historical Provider, MD  ibuprofen (ADVIL,MOTRIN) 200 MG tablet Take 600-800 mg by mouth every 6 (six) hours as needed for mild pain. Pain    Historical Provider, MD  lansoprazole (PREVACID) 30 MG capsule TAKE 1 CAPSULE BY MOUTH 30 MINUTES PRIOR TO MEALS TWICE DAILY. 04/25/14   Carlis Stable, NP  oxyCODONE-acetaminophen (PERCOCET) 10-325 MG per tablet Take 1 tablet by mouth every 6 (six) hours as needed for pain.    Historical Provider, MD  topiramate (TOPAMAX) 50 MG tablet Take 50 mg by mouth 2 (two) times daily.    Historical Provider, MD   BP 156/83  mmHg  Pulse 75  Temp(Src) 97.8 F (36.6 C) (Oral)  Resp 18  Ht 5\' 4"  (1.626 m)  Wt 253 lb (114.76 kg)  BMI 43.41 kg/m2  SpO2 96%  LMP 05/16/2014 Physical Exam  Constitutional: She is oriented to person, place, and time. She appears well-developed and well-nourished. No distress.  HENT:  Head: Normocephalic and atraumatic.  Right Ear: Hearing normal.  Left Ear: Hearing normal.  Nose: Nose normal.  Mouth/Throat: Oropharynx is clear and moist and mucous membranes are normal.  Eyes: Conjunctivae and EOM are normal. Pupils are equal, round, and reactive to light.  Neck: Normal range of motion. Neck supple.  Cardiovascular: Regular rhythm, S1 normal and S2 normal.  Exam reveals no gallop and no  friction rub.   No murmur heard. Pulmonary/Chest: Effort normal and breath sounds normal. No respiratory distress. She exhibits no tenderness.  Abdominal: Soft. Normal appearance and bowel sounds are normal. There is no hepatosplenomegaly. There is no tenderness. There is no rebound, no guarding, no tenderness at McBurney's point and negative Murphy's sign. No hernia.  Musculoskeletal: Normal range of motion.  Neurological: She is alert and oriented to person, place, and time. She has normal strength. No cranial nerve deficit or sensory deficit. Coordination normal. GCS eye subscore is 4. GCS verbal subscore is 5. GCS motor subscore is 6.  Skin: Skin is warm, dry and intact. No rash noted. No cyanosis.  Psychiatric: She has a normal mood and affect. Her speech is normal and behavior is normal. Thought content normal.    ED Course  Procedures (including critical care time) DIAGNOSTIC STUDIES: Oxygen Saturation is 96% on RA, normal by my interpretation.    COORDINATION OF CARE: 8:59 AM- Pt advised of plan for treatment and pt agrees.  Labs Review Labs Reviewed - No data to display  Imaging Review No results found.   EKG Interpretation None      MDM   Final diagnoses:  None   gastroenteritis Migraine  Patient presents to the ER for evaluation of nausea, vomiting and diarrhea. She does report a sick contact at home, specifically her daughter, with similar symptoms. Patient has been experiencing increased acid reflux symptoms with nausea and vomiting. She also has headache, has a history of migraines. This feels like a migraine for her, no unusual features.  Examination was unremarkable. Abdominal exam is benign. Labs are normal. Patient feeling much better with fluids and treatment for migraine. Will be discharged, continue oral hydration and symptomatic treatment.   I personally performed the services described in this documentation, which was scribed in my presence. The  recorded information has been reviewed and is accurate.      Orpah Greek, MD 06/03/14 [phone]

## 2014-06-21 ENCOUNTER — Encounter (HOSPITAL_COMMUNITY): Payer: Self-pay | Admitting: Cardiology

## 2014-06-21 ENCOUNTER — Emergency Department (HOSPITAL_COMMUNITY)
Admission: EM | Admit: 2014-06-21 | Discharge: 2014-06-21 | Disposition: A | Discharge status: Home / Self Care | Payer: Medicaid Other | Attending: Emergency Medicine | Admitting: Emergency Medicine

## 2014-06-21 DIAGNOSIS — F329 Major depressive disorder, single episode, unspecified: Secondary | ICD-10-CM | POA: Diagnosis not present

## 2014-06-21 DIAGNOSIS — K8020 Calculus of gallbladder without cholecystitis without obstruction: Secondary | ICD-10-CM | POA: Insufficient documentation

## 2014-06-21 DIAGNOSIS — R1013 Epigastric pain: Secondary | ICD-10-CM

## 2014-06-21 DIAGNOSIS — G8929 Other chronic pain: Secondary | ICD-10-CM | POA: Diagnosis not present

## 2014-06-21 DIAGNOSIS — Z720 Tobacco use: Secondary | ICD-10-CM | POA: Diagnosis not present

## 2014-06-21 DIAGNOSIS — K219 Gastro-esophageal reflux disease without esophagitis: Secondary | ICD-10-CM | POA: Diagnosis not present

## 2014-06-21 DIAGNOSIS — Z79899 Other long term (current) drug therapy: Secondary | ICD-10-CM | POA: Insufficient documentation

## 2014-06-21 DIAGNOSIS — G43909 Migraine, unspecified, not intractable, without status migrainosus: Secondary | ICD-10-CM | POA: Diagnosis not present

## 2014-06-21 DIAGNOSIS — Z872 Personal history of diseases of the skin and subcutaneous tissue: Secondary | ICD-10-CM | POA: Insufficient documentation

## 2014-06-21 DIAGNOSIS — Z3202 Encounter for pregnancy test, result negative: Secondary | ICD-10-CM | POA: Diagnosis not present

## 2014-06-21 DIAGNOSIS — F410 Panic disorder [episodic paroxysmal anxiety] without agoraphobia: Secondary | ICD-10-CM | POA: Insufficient documentation

## 2014-06-21 DIAGNOSIS — K8050 Calculus of bile duct without cholangitis or cholecystitis without obstruction: Secondary | ICD-10-CM

## 2014-06-21 DIAGNOSIS — Z87448 Personal history of other diseases of urinary system: Secondary | ICD-10-CM | POA: Insufficient documentation

## 2014-06-21 LAB — CBC WITH DIFFERENTIAL/PLATELET
Basophils Absolute: 0 10*3/uL (ref 0.0–0.1)
Basophils Relative: 0 % (ref 0–1)
Eosinophils Absolute: 0.1 10*3/uL (ref 0.0–0.7)
Eosinophils Relative: 1 % (ref 0–5)
HCT: 38.5 % (ref 36.0–46.0)
Hemoglobin: 12.7 g/dL (ref 12.0–15.0)
LYMPHS PCT: 28 % (ref 12–46)
Lymphs Abs: 3.1 10*3/uL (ref 0.7–4.0)
MCH: 30.2 pg (ref 26.0–34.0)
MCHC: 33 g/dL (ref 30.0–36.0)
MCV: 91.7 fL (ref 78.0–100.0)
Monocytes Absolute: 0.9 10*3/uL (ref 0.1–1.0)
Monocytes Relative: 8 % (ref 3–12)
Neutro Abs: 7 10*3/uL (ref 1.7–7.7)
Neutrophils Relative %: 63 % (ref 43–77)
PLATELETS: 327 10*3/uL (ref 150–400)
RBC: 4.2 MIL/uL (ref 3.87–5.11)
RDW: 13 % (ref 11.5–15.5)
WBC: 11.2 10*3/uL — AB (ref 4.0–10.5)

## 2014-06-21 LAB — COMPREHENSIVE METABOLIC PANEL
ALT: 17 U/L (ref 14–54)
AST: 16 U/L (ref 15–41)
Albumin: 4.2 g/dL (ref 3.5–5.0)
Alkaline Phosphatase: 46 U/L (ref 38–126)
Anion gap: 10 (ref 5–15)
BUN: 14 mg/dL (ref 6–20)
CO2: 32 mmol/L (ref 22–32)
Calcium: 9.6 mg/dL (ref 8.9–10.3)
Chloride: 98 mmol/L — ABNORMAL LOW (ref 101–111)
Creatinine, Ser: 0.98 mg/dL (ref 0.44–1.00)
Glucose, Bld: 109 mg/dL — ABNORMAL HIGH (ref 65–99)
Potassium: 4.2 mmol/L (ref 3.5–5.1)
Sodium: 140 mmol/L (ref 135–145)
TOTAL PROTEIN: 7.7 g/dL (ref 6.5–8.1)
Total Bilirubin: 0.3 mg/dL (ref 0.3–1.2)

## 2014-06-21 LAB — POC URINE PREG, ED: Preg Test, Ur: NEGATIVE

## 2014-06-21 LAB — LIPASE, BLOOD: Lipase: 35 U/L (ref 22–51)

## 2014-06-21 MED ORDER — MORPHINE SULFATE 4 MG/ML IJ SOLN
4.0000 mg | Freq: Once | INTRAMUSCULAR | Status: AC
  Administered 2014-06-21: 4 mg via INTRAVENOUS
  Filled 2014-06-21: qty 1

## 2014-06-21 MED ORDER — FAMOTIDINE 20 MG PO TABS
20.0000 mg | ORAL_TABLET | Freq: Once | ORAL | Status: AC
  Administered 2014-06-21: 20 mg via ORAL
  Filled 2014-06-21: qty 1

## 2014-06-21 MED ORDER — ONDANSETRON 4 MG PO TBDP: ORAL_TABLET | ORAL | Status: DC

## 2014-06-21 MED ORDER — SODIUM CHLORIDE 0.9 % IV BOLUS (SEPSIS)
500.0000 mL | Freq: Once | INTRAVENOUS | Status: AC
  Administered 2014-06-21: 500 mL via INTRAVENOUS

## 2014-06-21 NOTE — ED Provider Notes (Signed)
CSN: [id number]     Arrival date & time 06/21/14  0719 History   First MD Initiated Contact with Patient 06/21/14 [phone]     Chief Complaint  Patient presents with  . Abdominal Pain     (Consider location/radiation/quality/duration/timing/severity/associated sxs/prior Treatment) HPI Comments: Patient presents with epigastric pain worse after eating since this morning. No history of similar. No fevers or chills.  Nonradiating  Patient is a 29 y.o. female presenting with abdominal pain. The history is provided by the patient.  Abdominal Pain Associated symptoms: nausea   Associated symptoms: no chest pain, no chills, no dysuria, no fever, no shortness of breath and no vomiting     Past Medical History  Diagnosis Date  . Panic disorder without agoraphobia   . Esophageal reflux   . Asymptomatic varicose veins   . Ingrowing nail   . Depression   . Chronic back pain   . Abnormal pap   . DDD (degenerative disc disease)   . Migraines   . Chronic nausea   . Renal disorder    Past Surgical History  Procedure Laterality Date  . Tonsillectomy    . Esophagogastroduodenoscopy  06/11/2011     SMALL HIATAL HERNIA/Moderate gastritis/  BURNING CHESTPAIN: GERD, NERD, OR NON-ULCER DYSPEPSIA/ Nl esophagus   Family History  Problem Relation Age of Onset  . Pancreatic cancer Father 15   History  Substance Use Topics  . Smoking status: Heavy Tobacco Smoker -- 0.50 packs/day    Types: Cigarettes  . Smokeless tobacco: Not on file     Comment: pt reports '4 cigarettes per day."  . Alcohol Use: Yes     Comment: occasionally   OB History    Gravida Para Term Preterm AB TAB SAB Ectopic Multiple Living            1     Review of Systems  Constitutional: Negative for fever and chills.  HENT: Negative for congestion.   Eyes: Negative for visual disturbance.  Respiratory: Negative for shortness of breath.   Cardiovascular: Negative for chest pain.  Gastrointestinal: Positive for nausea and  abdominal pain. Negative for vomiting.  Genitourinary: Negative for dysuria and flank pain.  Musculoskeletal: Negative for back pain, neck pain and neck stiffness.  Skin: Negative for rash.  Neurological: Negative for light-headedness and headaches.      Allergies  Review of patient's allergies indicates no known allergies.  Home Medications   Prior to Admission medications   Medication Sig Start Date End Date Taking? Authorizing Provider  ALPRAZolam Duanne Moron) 0.5 MG tablet Take 0.5 mg by mouth 2 (two) times daily as needed for sleep or anxiety.   Yes Historical Provider, MD  baclofen (LIORESAL) 10 MG tablet Take 1 tablet (10 mg total) by mouth 3 (three) times daily before meals. 07/29/13  Yes Orvil Feil, NP  citalopram (CELEXA) 40 MG tablet Take 40 mg by mouth every morning.    Yes Historical Provider, MD  ibuprofen (ADVIL,MOTRIN) 200 MG tablet Take 600-800 mg by mouth every 6 (six) hours as needed for mild pain. Pain   Yes Historical Provider, MD  lansoprazole (PREVACID) 30 MG capsule TAKE 1 CAPSULE BY MOUTH 30 MINUTES PRIOR TO MEALS TWICE DAILY. 04/25/14  Yes Carlis Stable, NP  oxyCODONE-acetaminophen (PERCOCET) 10-325 MG per tablet Take 1 tablet by mouth every 6 (six) hours as needed for pain.   Yes Historical Provider, MD  promethazine (PHENERGAN) 25 MG tablet Take 1 tablet (25 mg total) by mouth every  6 (six) hours as needed for nausea or vomiting. 06/03/14  Yes Orpah Greek, MD  topiramate (TOPAMAX) 50 MG tablet Take 50 mg by mouth 2 (two) times daily.   Yes Historical Provider, MD  cephALEXin (KEFLEX) 500 MG capsule Take 1 capsule (500 mg total) by mouth 4 (four) times daily. Patient not taking: Reported on 06/21/2014 04/02/60   Delora Fuel, MD  ondansetron (ZOFRAN ODT) 4 MG disintegrating tablet 4mg  ODT q4 hours prn nausea/vomit 06/21/14   Elnora Morrison, MD   BP 128/93 mmHg  Pulse 55  Temp(Src) 97.7 F (36.5 C) (Oral)  Resp 14  Ht 5\' 3"  (1.6 m)  Wt 253 lb (114.76 kg)  BMI  44.83 kg/m2  SpO2 98%  LMP 06/15/2014 Physical Exam  Constitutional: She is oriented to person, place, and time. She appears well-developed and well-nourished.  HENT:  Head: Normocephalic and atraumatic.  Eyes: Conjunctivae are normal. Right eye exhibits no discharge. Left eye exhibits no discharge. No scleral icterus.  Neck: Normal range of motion. Neck supple. No tracheal deviation present.  Cardiovascular: Normal rate and regular rhythm.   Pulmonary/Chest: Effort normal and breath sounds normal.  Abdominal: Soft. She exhibits no distension. There is tenderness (mild epig). There is no guarding.  Musculoskeletal: She exhibits no edema.  Neurological: She is alert and oriented to person, place, and time.  Skin: Skin is warm. No rash noted.  Psychiatric: She has a normal mood and affect.  Nursing note and vitals reviewed.   ED Course  Procedures (including critical care time)  EMERGENCY DEPARTMENT BILIARY ULTRASOUND INTERPRETATION "Study: Limited Abdominal Ultrasound of the gallbladder and common bile duct."  INDICATIONS: Abdominal pain and Nausea Indication: Multiple views of the gallbladder and common bile duct were obtained in real-time with a Multi-frequency probe." PERFORMED BY:  Myself IMAGES ARCHIVED?: Yes FINDINGS: Gallstones present, Gallbladder enlarged, Gallbladder wall normal in thickness and Sonographic Murphy's sign absent LIMITATIONS: Body Habitus and Bowel Gas INTERPRETATION: Cholelithiasis  CPT Code [phone] (limited abdominal)    Labs Review Labs Reviewed  COMPREHENSIVE METABOLIC PANEL - Abnormal; Notable for the following:    Chloride 98 (*)    Glucose, Bld 109 (*)    All other components within normal limits  CBC WITH DIFFERENTIAL/PLATELET - Abnormal; Notable for the following:    WBC 11.2 (*)    All other components within normal limits  LIPASE, BLOOD  POC URINE PREG, ED    Imaging Review No results found.   EKG Interpretation   Date/Time:   Tuesday Jun 21 2014 07:35:17 EDT Ventricular Rate:  74 PR Interval:  124 QRS Duration: 100 QT Interval:  384 QTC Calculation: 426 R Axis:   38 Text Interpretation:  Sinus rhythm Confirmed by Chari Parmenter  MD, Renad Jenniges ([id number])  on 06/21/2014 7:37:55 AM      MDM   Final diagnoses:  Epigastric pain  Biliary colic   Concern for reflux versus gallbladder. Bedside ultrasound showed gallstone in the neck. Patient's pain resolved in the ER, tolerating oral fluids. Results and differential diagnosis were discussed with the patient/parent/guardian. Close follow up outpatient was discussed, comfortable with the plan.   Medications  famotidine (PEPCID) tablet 20 mg (20 mg Oral Given 06/21/14 0743)  morphine 4 MG/ML injection 4 mg (4 mg Intravenous Given 06/21/14 0748)  sodium chloride 0.9 % bolus 500 mL (0 mLs Intravenous Stopped 06/21/14 1153)    Filed Vitals:   06/21/14 1000 06/21/14 1030 06/21/14 1052 06/21/14 1100  BP: 127/74 131/81 131/81 128/93  Pulse:  51 55 61 55  Temp:      TempSrc:      Resp: 13 16 16 14   Height:      Weight:      SpO2: 97% 94% 97% 98%    Final diagnoses:  Epigastric pain  Biliary colic       Elnora Morrison, MD 06/21/14 [phone]

## 2014-06-21 NOTE — Discharge Instructions (Signed)
If you were given medicines take as directed.  If you are on coumadin or contraceptives realize their levels and effectiveness is altered by many different medicines.  If you have any reaction (rash, tongues swelling, other) to the medicines stop taking and see a physician.    If your blood pressure was elevated in the ER make sure you follow up for management with a primary doctor or return for chest pain, shortness of breath or stroke symptoms.  Please follow up as directed and return to the ER or see a physician for new or worsening symptoms.  Thank you. Filed Vitals:   06/21/14 0728 06/21/14 0800 06/21/14 1052  BP: 155/108 130/94 131/81  Pulse: 64 54 61  Temp: 97.7 F (36.5 C)    TempSrc: Oral    Resp: 20 15 16   Height: 5\' 3"  (1.6 m)    Weight: 253 lb (114.76 kg)    SpO2: 98% 98% 97%

## 2014-06-21 NOTE — ED Notes (Signed)
Woke up with epigastric pain this morning.  Nauseated.

## 2014-06-28 ENCOUNTER — Emergency Department (HOSPITAL_COMMUNITY): Payer: Medicaid Other

## 2014-06-28 ENCOUNTER — Emergency Department (HOSPITAL_COMMUNITY)
Admission: EM | Admit: 2014-06-28 | Discharge: 2014-06-29 | Disposition: A | Discharge status: Home / Self Care | Payer: Medicaid Other | Attending: Emergency Medicine | Admitting: Emergency Medicine

## 2014-06-28 ENCOUNTER — Encounter (HOSPITAL_COMMUNITY): Payer: Self-pay | Admitting: *Deleted

## 2014-06-28 DIAGNOSIS — F329 Major depressive disorder, single episode, unspecified: Secondary | ICD-10-CM | POA: Diagnosis not present

## 2014-06-28 DIAGNOSIS — Z79899 Other long term (current) drug therapy: Secondary | ICD-10-CM | POA: Insufficient documentation

## 2014-06-28 DIAGNOSIS — G8929 Other chronic pain: Secondary | ICD-10-CM | POA: Diagnosis not present

## 2014-06-28 DIAGNOSIS — Z87448 Personal history of other diseases of urinary system: Secondary | ICD-10-CM | POA: Diagnosis not present

## 2014-06-28 DIAGNOSIS — G43909 Migraine, unspecified, not intractable, without status migrainosus: Secondary | ICD-10-CM | POA: Diagnosis not present

## 2014-06-28 DIAGNOSIS — Z3202 Encounter for pregnancy test, result negative: Secondary | ICD-10-CM | POA: Insufficient documentation

## 2014-06-28 DIAGNOSIS — Z720 Tobacco use: Secondary | ICD-10-CM | POA: Diagnosis not present

## 2014-06-28 DIAGNOSIS — F410 Panic disorder [episodic paroxysmal anxiety] without agoraphobia: Secondary | ICD-10-CM | POA: Diagnosis not present

## 2014-06-28 DIAGNOSIS — R1011 Right upper quadrant pain: Secondary | ICD-10-CM | POA: Diagnosis not present

## 2014-06-28 DIAGNOSIS — Z872 Personal history of diseases of the skin and subcutaneous tissue: Secondary | ICD-10-CM | POA: Diagnosis not present

## 2014-06-28 DIAGNOSIS — R109 Unspecified abdominal pain: Secondary | ICD-10-CM

## 2014-06-28 DIAGNOSIS — K219 Gastro-esophageal reflux disease without esophagitis: Secondary | ICD-10-CM | POA: Diagnosis not present

## 2014-06-28 LAB — CBC WITH DIFFERENTIAL/PLATELET
BASOS ABS: 0 10*3/uL (ref 0.0–0.1)
BASOS PCT: 0 % (ref 0–1)
Eosinophils Absolute: 0.1 10*3/uL (ref 0.0–0.7)
Eosinophils Relative: 1 % (ref 0–5)
HCT: 39 % (ref 36.0–46.0)
Hemoglobin: 12.7 g/dL (ref 12.0–15.0)
LYMPHS ABS: 2.9 10*3/uL (ref 0.7–4.0)
Lymphocytes Relative: 29 % (ref 12–46)
MCH: 29.8 pg (ref 26.0–34.0)
MCHC: 32.6 g/dL (ref 30.0–36.0)
MCV: 91.5 fL (ref 78.0–100.0)
Monocytes Absolute: 0.8 10*3/uL (ref 0.1–1.0)
Monocytes Relative: 7 % (ref 3–12)
NEUTROS ABS: 6.3 10*3/uL (ref 1.7–7.7)
NEUTROS PCT: 63 % (ref 43–77)
PLATELETS: 358 10*3/uL (ref 150–400)
RBC: 4.26 MIL/uL (ref 3.87–5.11)
RDW: 13 % (ref 11.5–15.5)
WBC: 10.1 10*3/uL (ref 4.0–10.5)

## 2014-06-28 LAB — URINALYSIS, ROUTINE W REFLEX MICROSCOPIC
BILIRUBIN URINE: NEGATIVE
GLUCOSE, UA: NEGATIVE mg/dL
Hgb urine dipstick: NEGATIVE
Ketones, ur: NEGATIVE mg/dL
Leukocytes, UA: NEGATIVE
Nitrite: NEGATIVE
Protein, ur: NEGATIVE mg/dL
Urobilinogen, UA: 0.2 mg/dL (ref 0.0–1.0)
pH: 6 (ref 5.0–8.0)

## 2014-06-28 LAB — PREGNANCY, URINE: Preg Test, Ur: NEGATIVE

## 2014-06-28 MED ORDER — SODIUM CHLORIDE 0.9 % IV SOLN
INTRAVENOUS | Status: DC
  Administered 2014-06-28: via INTRAVENOUS

## 2014-06-28 MED ORDER — ONDANSETRON HCL 4 MG/2ML IJ SOLN
4.0000 mg | Freq: Once | INTRAMUSCULAR | Status: AC
  Administered 2014-06-28: 4 mg via INTRAVENOUS
  Filled 2014-06-28: qty 2

## 2014-06-28 MED ORDER — IOHEXOL 300 MG/ML  SOLN
50.0000 mL | Freq: Once | INTRAMUSCULAR | Status: AC | PRN
  Administered 2014-06-28: 50 mL via ORAL

## 2014-06-28 MED ORDER — SODIUM CHLORIDE 0.9 % IV BOLUS (SEPSIS)
1000.0000 mL | Freq: Once | INTRAVENOUS | Status: AC
  Administered 2014-06-28: 1000 mL via INTRAVENOUS

## 2014-06-28 MED ORDER — HYDROMORPHONE HCL 1 MG/ML IJ SOLN
1.0000 mg | Freq: Once | INTRAMUSCULAR | Status: AC
  Administered 2014-06-28: 1 mg via INTRAVENOUS
  Filled 2014-06-28: qty 1

## 2014-06-28 NOTE — ED Notes (Signed)
MD at bedside. 

## 2014-06-28 NOTE — ED Notes (Signed)
Pt c/o right upper abdominal pain that started suddenly at 1900 this evening

## 2014-06-28 NOTE — ED Notes (Signed)
Pt states she has gallstones and has been unable to get an appointment with a surgeon, states she cannot take the pain.

## 2014-06-29 LAB — COMPREHENSIVE METABOLIC PANEL
ALK PHOS: 46 U/L (ref 38–126)
ALT: 28 U/L (ref 14–54)
AST: 20 U/L (ref 15–41)
Albumin: 4.7 g/dL (ref 3.5–5.0)
Anion gap: 7 (ref 5–15)
BUN: 12 mg/dL (ref 6–20)
CO2: 24 mmol/L (ref 22–32)
CREATININE: 0.75 mg/dL (ref 0.44–1.00)
Calcium: 8.7 mg/dL — ABNORMAL LOW (ref 8.9–10.3)
Chloride: 104 mmol/L (ref 101–111)
GFR calc non Af Amer: >60 mL/min (ref >60)
Glucose, Bld: 92 mg/dL (ref 65–99)
POTASSIUM: 3.9 mmol/L (ref 3.5–5.1)
Sodium: 135 mmol/L (ref 135–145)
TOTAL PROTEIN: 8.5 g/dL — AB (ref 6.5–8.1)
Total Bilirubin: 0.3 mg/dL (ref 0.3–1.2)

## 2014-06-29 LAB — LIPASE, BLOOD: Lipase: 29 U/L (ref 22–51)

## 2014-06-29 MED ORDER — TRAMADOL HCL 50 MG PO TABS: 50.0000 mg | ORAL_TABLET | Freq: Four times a day (QID) | ORAL | Status: DC | PRN

## 2014-06-29 MED ORDER — PROMETHAZINE HCL 25 MG PO TABS: 25.0000 mg | ORAL_TABLET | Freq: Four times a day (QID) | ORAL | Status: DC | PRN

## 2014-06-29 MED ORDER — IOHEXOL 300 MG/ML  SOLN
100.0000 mL | Freq: Once | INTRAMUSCULAR | Status: AC | PRN
  Administered 2014-06-29: 100 mL via INTRAVENOUS

## 2014-06-29 NOTE — ED Notes (Signed)
Discharge instructions given, pt demonstrated teach back and verbal understanding. No concerns voiced.  

## 2014-06-29 NOTE — ED Provider Notes (Signed)
CSN: [id number]     Arrival date & time 06/28/14  1931 History   First MD Initiated Contact with Patient 06/28/14 2203     Chief Complaint  Patient presents with  . Abdominal Pain     (Consider location/radiation/quality/duration/timing/severity/associated sxs/prior Treatment) Patient is a 29 y.o. female presenting with abdominal pain. The history is provided by the patient.  Abdominal Pain Associated symptoms: no chest pain, no dysuria, no fever, no nausea, no shortness of breath and no vomiting    patient with a history of on and off right upper quadrant abdominal pain. Seen for the same on May 24. Had a bedside ultrasound done as a screening exam raised concerns for possible gallstone. Today patient's pain started acutely at 7 in the evening. Not associated with nausea or vomiting. Pain is right upper quadrant does not radiate to the back. Not made worse or better by anything. Patient states pain is 8 out of 10.  Past Medical History  Diagnosis Date  . Panic disorder without agoraphobia   . Esophageal reflux   . Asymptomatic varicose veins   . Ingrowing nail   . Depression   . Chronic back pain   . Abnormal pap   . DDD (degenerative disc disease)   . Migraines   . Chronic nausea   . Renal disorder    Past Surgical History  Procedure Laterality Date  . Tonsillectomy    . Esophagogastroduodenoscopy  06/11/2011     SMALL HIATAL HERNIA/Moderate gastritis/  BURNING CHESTPAIN: GERD, NERD, OR NON-ULCER DYSPEPSIA/ Nl esophagus   Family History  Problem Relation Age of Onset  . Pancreatic cancer Father 48   History  Substance Use Topics  . Smoking status: Heavy Tobacco Smoker -- 0.50 packs/day    Types: Cigarettes  . Smokeless tobacco: Not on file     Comment: pt reports '4 cigarettes per day."  . Alcohol Use: Yes     Comment: occasionally   OB History    Gravida Para Term Preterm AB TAB SAB Ectopic Multiple Living            1     Review of Systems  Constitutional:  Negative for fever.  HENT: Negative for congestion.   Eyes: Negative for visual disturbance.  Respiratory: Negative for shortness of breath.   Cardiovascular: Negative for chest pain.  Gastrointestinal: Positive for abdominal pain. Negative for nausea and vomiting.  Genitourinary: Negative for dysuria.  Musculoskeletal: Negative for back pain.  Skin: Negative for rash.  Neurological: Negative for headaches.  Hematological: Does not bruise/bleed easily.  Psychiatric/Behavioral: Negative for confusion.      Allergies  Review of patient's allergies indicates no known allergies.  Home Medications   Prior to Admission medications   Medication Sig Start Date End Date Taking? Authorizing Provider  ALPRAZolam Duanne Moron) 0.5 MG tablet Take 0.5 mg by mouth 2 (two) times daily as needed for sleep or anxiety.   Yes Historical Provider, MD  baclofen (LIORESAL) 10 MG tablet Take 1 tablet (10 mg total) by mouth 3 (three) times daily before meals. 07/29/13  Yes Orvil Feil, NP  citalopram (CELEXA) 40 MG tablet Take 40 mg by mouth every morning.    Yes Historical Provider, MD  ibuprofen (ADVIL,MOTRIN) 200 MG tablet Take 600-800 mg by mouth every 6 (six) hours as needed for mild pain. Pain   Yes Historical Provider, MD  lansoprazole (PREVACID) 30 MG capsule TAKE 1 CAPSULE BY MOUTH 30 MINUTES PRIOR TO MEALS TWICE DAILY. 04/25/14  Yes Carlis Stable, NP  oxyCODONE-acetaminophen (PERCOCET) 10-325 MG per tablet Take 1 tablet by mouth every 6 (six) hours as needed for pain.   Yes Historical Provider, MD  topiramate (TOPAMAX) 50 MG tablet Take 50 mg by mouth 2 (two) times daily.   Yes Historical Provider, MD  cephALEXin (KEFLEX) 500 MG capsule Take 1 capsule (500 mg total) by mouth 4 (four) times daily. Patient not taking: Reported on 06/21/2014 04/28/64   Delora Fuel, MD  ondansetron (ZOFRAN ODT) 4 MG disintegrating tablet 4mg  ODT q4 hours prn nausea/vomit Patient not taking: Reported on 06/28/2014 06/21/14   Elnora Morrison, MD  promethazine (PHENERGAN) 25 MG tablet Take 1 tablet (25 mg total) by mouth every 6 (six) hours as needed for nausea or vomiting. Patient not taking: Reported on 06/28/2014 06/03/14   Orpah Greek, MD   BP 165/108 mmHg  Pulse 77  Temp(Src) 98.2 F (36.8 C) (Oral)  Resp 20  Ht 5\' 4"  (1.626 m)  Wt 253 lb (114.76 kg)  BMI 43.41 kg/m2  SpO2 100%  LMP 06/15/2014 Physical Exam  Constitutional: She is oriented to person, place, and time. She appears well-developed and well-nourished. No distress.  HENT:  Head: Normocephalic and atraumatic.  Mouth/Throat: Oropharynx is clear and moist.  Eyes: Conjunctivae and EOM are normal. Pupils are equal, round, and reactive to light.  Neck: Normal range of motion. Neck supple.  Cardiovascular: Normal rate, regular rhythm and intact distal pulses.   No murmur heard. Pulmonary/Chest: Effort normal and breath sounds normal. No respiratory distress.  Abdominal: Soft. Bowel sounds are normal. There is tenderness.  Very mild tenderness right upper quadrant no guarding.  Musculoskeletal: Normal range of motion.  Neurological: She is alert and oriented to person, place, and time. No cranial nerve deficit. She exhibits normal muscle tone. Coordination normal.  Skin: Skin is warm. No rash noted.  Nursing note and vitals reviewed.   ED Course  Procedures (including critical care time) Labs Review Labs Reviewed  URINALYSIS, ROUTINE W REFLEX MICROSCOPIC (NOT AT [facility]) - Abnormal; Notable for the following:    APPearance HAZY (*)    Specific Gravity, Urine >1.030 (*)    All other components within normal limits  COMPREHENSIVE METABOLIC PANEL - Abnormal; Notable for the following:    Calcium 8.7 (*)    Total Protein 8.5 (*)    All other components within normal limits  PREGNANCY, URINE  LIPASE, BLOOD  CBC WITH DIFFERENTIAL/PLATELET   Results for orders placed or performed during the hospital encounter of 06/28/14  Urinalysis, Routine w  reflex microscopic  Result Value Ref Range   Color, Urine YELLOW YELLOW   APPearance HAZY (A) CLEAR   Specific Gravity, Urine >1.030 (H) 1.005 - 1.030   pH 6.0 5.0 - 8.0   Glucose, UA NEGATIVE NEGATIVE mg/dL   Hgb urine dipstick NEGATIVE NEGATIVE   Bilirubin Urine NEGATIVE NEGATIVE   Ketones, ur NEGATIVE NEGATIVE mg/dL   Protein, ur NEGATIVE NEGATIVE mg/dL   Urobilinogen, UA 0.2 0.0 - 1.0 mg/dL   Nitrite NEGATIVE NEGATIVE   Leukocytes, UA NEGATIVE NEGATIVE  Pregnancy, urine  Result Value Ref Range   Preg Test, Ur NEGATIVE NEGATIVE  Lipase, blood  Result Value Ref Range   Lipase 29 22 - 51 U/L  Comprehensive metabolic panel  Result Value Ref Range   Sodium 135 135 - 145 mmol/L   Potassium 3.9 3.5 - 5.1 mmol/L   Chloride 104 101 - 111 mmol/L   CO2 24  22 - 32 mmol/L   Glucose, Bld 92 65 - 99 mg/dL   BUN 12 6 - 20 mg/dL   Creatinine, Ser 0.75 0.44 - 1.00 mg/dL   Calcium 8.7 (L) 8.9 - 10.3 mg/dL   Total Protein 8.5 (H) 6.5 - 8.1 g/dL   Albumin 4.7 3.5 - 5.0 g/dL   AST 20 15 - 41 U/L   ALT 28 14 - 54 U/L   Alkaline Phosphatase 46 38 - 126 U/L   Total Bilirubin 0.3 0.3 - 1.2 mg/dL   GFR calc non Af Amer >60 >60 mL/min   GFR calc Af Amer >60 >60 mL/min   Anion gap 7 5 - 15  CBC with Differential/Platelet  Result Value Ref Range   WBC 10.1 4.0 - 10.5 K/uL   RBC 4.26 3.87 - 5.11 MIL/uL   Hemoglobin 12.7 12.0 - 15.0 g/dL   HCT 39.0 36.0 - 46.0 %   MCV 91.5 78.0 - 100.0 fL   MCH 29.8 26.0 - 34.0 pg   MCHC 32.6 30.0 - 36.0 g/dL   RDW 13.0 11.5 - 15.5 %   Platelets 358 150 - 400 K/uL   Neutrophils Relative % 63 43 - 77 %   Neutro Abs 6.3 1.7 - 7.7 K/uL   Lymphocytes Relative 29 12 - 46 %   Lymphs Abs 2.9 0.7 - 4.0 K/uL   Monocytes Relative 7 3 - 12 %   Monocytes Absolute 0.8 0.1 - 1.0 K/uL   Eosinophils Relative 1 0 - 5 %   Eosinophils Absolute 0.1 0.0 - 0.7 K/uL   Basophils Relative 0 0 - 1 %   Basophils Absolute 0.0 0.0 - 0.1 K/uL     Imaging Review No results  found.   EKG Interpretation None      MDM   Final diagnoses:  Abdominal pain    Patient presenting with acute onset of right upper quadrant abdominal pain. Started at 7 this evening. Patient was seen in the emergency department on May 24 had an ultrasound done at the bedside reportedly showing gallstones. Patient has had no confirmation of gallstones by CT or ultrasound. Pain tinnitus associated right upper quadrant no nausea no vomiting.  CT of the abdomen done to rule out or rule in gallstones. Patient without a leukocytosis patient without evidence of pancreatitis. Pregnancy test is negative. Urinalysis is negative liver function tests without any acute abnormalities.    Fredia Sorrow, MD 06/29/14 [phone]

## 2014-06-29 NOTE — Discharge Instructions (Signed)
Workup for the belly pain without any significant findings. No evidence on CAT scan of any gallstones. Follow-up with your doctor. Sometimes the gallbladder can be giving problems causes dysfunctional that is a different kind of workup that requires a hiatus scan. That is not a surgical emergency. Take pain medicine as directed take antinausea medicine as directed. Labs without any significant abnormalities.

## 2014-07-06 ENCOUNTER — Ambulatory Visit: Payer: Medicaid Other | Admitting: Nurse Practitioner

## 2014-07-06 ENCOUNTER — Telehealth: Payer: Self-pay | Admitting: Nurse Practitioner

## 2014-07-06 NOTE — Telephone Encounter (Signed)
Noted  

## 2014-07-06 NOTE — Telephone Encounter (Signed)
Pt was a no show

## 2014-08-22 ENCOUNTER — Encounter (HOSPITAL_COMMUNITY): Payer: Self-pay | Admitting: Emergency Medicine

## 2014-08-22 ENCOUNTER — Emergency Department (HOSPITAL_COMMUNITY)
Admission: EM | Admit: 2014-08-22 | Discharge: 2014-08-22 | Disposition: A | Discharge status: Home / Self Care | Payer: Medicaid Other | Attending: Emergency Medicine | Admitting: Emergency Medicine

## 2014-08-22 ENCOUNTER — Ambulatory Visit (HOSPITAL_COMMUNITY)
Admit: 2014-08-22 | Discharge: 2014-08-22 | Disposition: A | Discharge status: Home / Self Care | Payer: Medicaid Other | Source: Ambulatory Visit | Attending: Emergency Medicine | Admitting: Emergency Medicine

## 2014-08-22 DIAGNOSIS — K219 Gastro-esophageal reflux disease without esophagitis: Secondary | ICD-10-CM | POA: Insufficient documentation

## 2014-08-22 DIAGNOSIS — Z87448 Personal history of other diseases of urinary system: Secondary | ICD-10-CM | POA: Insufficient documentation

## 2014-08-22 DIAGNOSIS — F329 Major depressive disorder, single episode, unspecified: Secondary | ICD-10-CM | POA: Diagnosis not present

## 2014-08-22 DIAGNOSIS — K760 Fatty (change of) liver, not elsewhere classified: Secondary | ICD-10-CM | POA: Diagnosis not present

## 2014-08-22 DIAGNOSIS — F410 Panic disorder [episodic paroxysmal anxiety] without agoraphobia: Secondary | ICD-10-CM | POA: Diagnosis not present

## 2014-08-22 DIAGNOSIS — Z8679 Personal history of other diseases of the circulatory system: Secondary | ICD-10-CM | POA: Insufficient documentation

## 2014-08-22 DIAGNOSIS — Z3202 Encounter for pregnancy test, result negative: Secondary | ICD-10-CM | POA: Diagnosis not present

## 2014-08-22 DIAGNOSIS — K8020 Calculus of gallbladder without cholecystitis without obstruction: Secondary | ICD-10-CM | POA: Diagnosis not present

## 2014-08-22 DIAGNOSIS — G8929 Other chronic pain: Secondary | ICD-10-CM | POA: Diagnosis not present

## 2014-08-22 DIAGNOSIS — Z720 Tobacco use: Secondary | ICD-10-CM | POA: Insufficient documentation

## 2014-08-22 DIAGNOSIS — Z872 Personal history of diseases of the skin and subcutaneous tissue: Secondary | ICD-10-CM | POA: Diagnosis not present

## 2014-08-22 DIAGNOSIS — R1013 Epigastric pain: Secondary | ICD-10-CM | POA: Insufficient documentation

## 2014-08-22 DIAGNOSIS — R109 Unspecified abdominal pain: Secondary | ICD-10-CM | POA: Diagnosis present

## 2014-08-22 DIAGNOSIS — Z79899 Other long term (current) drug therapy: Secondary | ICD-10-CM | POA: Insufficient documentation

## 2014-08-22 LAB — URINALYSIS, ROUTINE W REFLEX MICROSCOPIC
Bilirubin Urine: NEGATIVE
Glucose, UA: NEGATIVE mg/dL
Ketones, ur: NEGATIVE mg/dL
NITRITE: NEGATIVE
PH: 6 (ref 5.0–8.0)
Protein, ur: NEGATIVE mg/dL
Specific Gravity, Urine: 1.02 (ref 1.005–1.030)
UROBILINOGEN UA: 0.2 mg/dL (ref 0.0–1.0)

## 2014-08-22 LAB — CBC WITH DIFFERENTIAL/PLATELET
BASOS PCT: 1 % (ref 0–1)
Basophils Absolute: 0 10*3/uL (ref 0.0–0.1)
Eosinophils Absolute: 0.1 10*3/uL (ref 0.0–0.7)
Eosinophils Relative: 1 % (ref 0–5)
HEMATOCRIT: 40.1 % (ref 36.0–46.0)
Hemoglobin: 13.3 g/dL (ref 12.0–15.0)
Lymphocytes Relative: 37 % (ref 12–46)
Lymphs Abs: 3.2 10*3/uL (ref 0.7–4.0)
MCH: 29.7 pg (ref 26.0–34.0)
MCHC: 33.2 g/dL (ref 30.0–36.0)
MCV: 89.5 fL (ref 78.0–100.0)
MONOS PCT: 8 % (ref 3–12)
Monocytes Absolute: 0.7 10*3/uL (ref 0.1–1.0)
NEUTROS ABS: 4.7 10*3/uL (ref 1.7–7.7)
NEUTROS PCT: 53 % (ref 43–77)
PLATELETS: 376 10*3/uL (ref 150–400)
RBC: 4.48 MIL/uL (ref 3.87–5.11)
RDW: 12.6 % (ref 11.5–15.5)
WBC: 8.7 10*3/uL (ref 4.0–10.5)

## 2014-08-22 LAB — COMPREHENSIVE METABOLIC PANEL
ALBUMIN: 4.3 g/dL (ref 3.5–5.0)
ALK PHOS: 42 U/L (ref 38–126)
ALT: 20 U/L (ref 14–54)
AST: 20 U/L (ref 15–41)
Anion gap: 10 (ref 5–15)
BUN: 10 mg/dL (ref 6–20)
CHLORIDE: 101 mmol/L (ref 101–111)
CO2: 28 mmol/L (ref 22–32)
Calcium: 9.2 mg/dL (ref 8.9–10.3)
Creatinine, Ser: 0.73 mg/dL (ref 0.44–1.00)
GFR calc Af Amer: >60 mL/min (ref >60)
Glucose, Bld: 97 mg/dL (ref 65–99)
POTASSIUM: 3.7 mmol/L (ref 3.5–5.1)
Sodium: 139 mmol/L (ref 135–145)
Total Bilirubin: 0.3 mg/dL (ref 0.3–1.2)
Total Protein: 8.1 g/dL (ref 6.5–8.1)

## 2014-08-22 LAB — URINE MICROSCOPIC-ADD ON

## 2014-08-22 LAB — I-STAT BETA HCG BLOOD, ED (MC, WL, AP ONLY)

## 2014-08-22 LAB — LIPASE, BLOOD: LIPASE: 31 U/L (ref 22–51)

## 2014-08-22 MED ORDER — FENTANYL CITRATE (PF) 100 MCG/2ML IJ SOLN
50.0000 ug | Freq: Once | INTRAMUSCULAR | Status: AC
  Administered 2014-08-22: 50 ug via INTRAVENOUS
  Filled 2014-08-22: qty 2

## 2014-08-22 MED ORDER — DIPHENHYDRAMINE HCL 50 MG/ML IJ SOLN
25.0000 mg | Freq: Once | INTRAMUSCULAR | Status: AC
  Administered 2014-08-22: 25 mg via INTRAVENOUS
  Filled 2014-08-22: qty 1

## 2014-08-22 MED ORDER — SODIUM CHLORIDE 0.9 % IV SOLN
1000.0000 mL | Freq: Once | INTRAVENOUS | Status: AC
  Administered 2014-08-22: 1000 mL via INTRAVENOUS

## 2014-08-22 MED ORDER — LORAZEPAM 2 MG/ML IJ SOLN
1.0000 mg | Freq: Once | INTRAMUSCULAR | Status: AC
  Administered 2014-08-22: 1 mg via INTRAVENOUS
  Filled 2014-08-22: qty 1

## 2014-08-22 MED ORDER — SODIUM CHLORIDE 0.9 % IV SOLN: 1000.0000 mL | INTRAVENOUS | Status: DC

## 2014-08-22 MED ORDER — METOCLOPRAMIDE HCL 5 MG/ML IJ SOLN
10.0000 mg | Freq: Once | INTRAMUSCULAR | Status: AC
Start: 2014-08-22 — End: 2014-08-22
  Administered 2014-08-22: 10 mg via INTRAVENOUS
  Filled 2014-08-22: qty 2

## 2014-08-22 NOTE — Discharge Instructions (Signed)
Avoid fried, spicy or greasy foods. You have an appointment today at 3:15 to get an ultrasound of your gallbladder. DO NOT EAT OR DRINK after 9 am or the test will not be as good. Arrive in Radiology at 3 pm to register. If you have gallstones, you will need to discuss having gallbladder surgery. You have pain medication you can take for pain as needed. Return to the ED if you get a fever or seem worse.

## 2014-08-22 NOTE — ED Provider Notes (Signed)
CSN: [id number]     Arrival date & time 08/22/14  0046 History   First MD Initiated Contact with Patient 08/22/14 0101     Chief Complaint  Patient presents with  . Abdominal Pain     (Consider location/radiation/quality/duration/timing/severity/associated sxs/prior Treatment) HPI patient reports earlier today around 3 PM she had some epigastric abdominal pain lasted about 10 minutes and went away. She reports she took a pain pill about 8 PM however at 1 AM she was laying down trying to go to sleep and had acute onset of severe epigastric pain. The pain does not radiate. She states is a deep pain and describes it as sharp. She denies nausea, vomiting, diarrhea, or constipation. She states she had a normal bowel movement today. She denies any burning in her throat or behind her chest. She states she ate stirred fried chicken with rice tonight for dinner. She states she had a bedside ultrasound done in the ED and was told she had gallstones. She has not noticed any food intolerance prior to today.  PCP Dr Merlyn Albert  Past Medical History  Diagnosis Date  . Panic disorder without agoraphobia   . Esophageal reflux   . Asymptomatic varicose veins   . Ingrowing nail   . Depression   . Chronic back pain   . Abnormal pap   . DDD (degenerative disc disease)   . Migraines   . Chronic nausea   . Renal disorder    Past Surgical History  Procedure Laterality Date  . Tonsillectomy    . Esophagogastroduodenoscopy  06/11/2011     SMALL HIATAL HERNIA/Moderate gastritis/  BURNING CHESTPAIN: GERD, NERD, OR NON-ULCER DYSPEPSIA/ Nl esophagus   Family History  Problem Relation Age of Onset  . Pancreatic cancer Father 45   History  Substance Use Topics  . Smoking status: Heavy Tobacco Smoker -- 0.50 packs/day    Types: Cigarettes  . Smokeless tobacco: Not on file     Comment: pt reports '4 cigarettes per day."  . Alcohol Use: Yes     Comment: occasionally   Unemployed Smokes 1/4 ppd  OB History     Gravida Para Term Preterm AB TAB SAB Ectopic Multiple Living            1     Review of Systems  All other systems reviewed and are negative.     Allergies  Review of patient's allergies indicates no known allergies.  Home Medications   Prior to Admission medications   Medication Sig Start Date End Date Taking? Authorizing Provider  ALPRAZolam Duanne Moron) 0.5 MG tablet Take 0.5 mg by mouth 2 (two) times daily as needed for sleep or anxiety.    Historical Provider, MD  baclofen (LIORESAL) 10 MG tablet Take 1 tablet (10 mg total) by mouth 3 (three) times daily before meals. 07/29/13   Orvil Feil, NP  cephALEXin (KEFLEX) 500 MG capsule Take 1 capsule (500 mg total) by mouth 4 (four) times daily. Patient not taking: Reported on 06/21/2014 09/28/61   Delora Fuel, MD  citalopram (CELEXA) 40 MG tablet Take 40 mg by mouth every morning.     Historical Provider, MD  ibuprofen (ADVIL,MOTRIN) 200 MG tablet Take 600-800 mg by mouth every 6 (six) hours as needed for mild pain. Pain    Historical Provider, MD  lansoprazole (PREVACID) 30 MG capsule TAKE 1 CAPSULE BY MOUTH 30 MINUTES PRIOR TO MEALS TWICE DAILY. 04/25/14   Carlis Stable, NP  ondansetron (ZOFRAN ODT) 4  MG disintegrating tablet 4mg  ODT q4 hours prn nausea/vomit Patient not taking: Reported on 06/28/2014 06/21/14   Elnora Morrison, MD  oxyCODONE-acetaminophen (PERCOCET) 10-325 MG per tablet Take 1 tablet by mouth every 6 (six) hours as needed for pain.    Historical Provider, MD  promethazine (PHENERGAN) 25 MG tablet Take 1 tablet (25 mg total) by mouth every 6 (six) hours as needed for nausea or vomiting. Patient not taking: Reported on 06/28/2014 06/03/14   Orpah Greek, MD  promethazine (PHENERGAN) 25 MG tablet Take 1 tablet (25 mg total) by mouth every 6 (six) hours as needed. 06/29/14   Fredia Sorrow, MD  topiramate (TOPAMAX) 50 MG tablet Take 50 mg by mouth 2 (two) times daily.    Historical Provider, MD  traMADol (ULTRAM) 50 MG tablet  Take 1 tablet (50 mg total) by mouth every 6 (six) hours as needed. 06/29/14   Fredia Sorrow, MD   BP 170/114 mmHg  Pulse 98  Temp(Src) 97.8 F (36.6 C) (Oral)  Resp 24  Ht 5\' 4"  (1.626 m)  Wt 253 lb (114.76 kg)  BMI 43.41 kg/m2  SpO2 98%  LMP 08/15/2014  Vital signs normal except for hypertension  Physical Exam  Constitutional: She is oriented to person, place, and time. She appears well-developed and well-nourished.  Non-toxic appearance. She does not appear ill. She appears distressed.  Yelling out loud, has her hands pushing into her epigastric area sobbing  HENT:  Head: Normocephalic and atraumatic.  Right Ear: External ear normal.  Left Ear: External ear normal.  Nose: Nose normal. No mucosal edema or rhinorrhea.  Mouth/Throat: Oropharynx is clear and moist and mucous membranes are normal. No dental abscesses or uvula swelling.  Eyes: Conjunctivae and EOM are normal. Pupils are equal, round, and reactive to light.  Neck: Normal range of motion and full passive range of motion without pain. Neck supple.  Cardiovascular: Normal rate, regular rhythm and normal heart sounds.  Exam reveals no gallop and no friction rub.   No murmur heard. Pulmonary/Chest: Effort normal and breath sounds normal. No respiratory distress. She has no wheezes. She has no rhonchi. She has no rales. She exhibits no tenderness and no crepitus.  Abdominal: Soft. Normal appearance and bowel sounds are normal. She exhibits no distension. There is tenderness. There is no rebound and no guarding.  She has some mild diffuse upper abdominal tenderness but she is most tender in her epigastric area.  Musculoskeletal: Normal range of motion. She exhibits no edema or tenderness.  Moves all extremities well.   Neurological: She is alert and oriented to person, place, and time. She has normal strength. No cranial nerve deficit.  Skin: Skin is warm, dry and intact. No rash noted. No erythema. No pallor.  Psychiatric:  She has a normal mood and affect. Her speech is normal and behavior is normal. Her mood appears not anxious.  Nursing note and vitals reviewed.   ED Course  Procedures (including critical care time)  Medications  0.9 %  sodium chloride infusion (0 mLs Intravenous Stopped 08/22/14 0257)    Followed by  0.9 %  sodium chloride infusion (1,000 mLs Intravenous New Bag/Given 08/22/14 0258)    Followed by  0.9 %  sodium chloride infusion (not administered)  metoCLOPramide (REGLAN) injection 10 mg (10 mg Intravenous Given 08/22/14 0150)  diphenhydrAMINE (BENADRYL) injection 25 mg (25 mg Intravenous Given 08/22/14 0150)  LORazepam (ATIVAN) injection 1 mg (1 mg Intravenous Given 08/22/14 0149)  fentaNYL (SUBLIMAZE) injection 50  mcg (50 mcg Intravenous Given 08/22/14 0150)   Patient has had 4 ED visits this past 6 months.  3 ER for abdominal pain and one was for back pain when she was diagnosed of UTI.On review of her x-ray studies in September 2014 she had a right ureteral renal stone. June 2015 she had an MRI of her lumbar spine showing some chronic changes of her lower spine. In January 2016 she had a CT scan of her abdomen showing a normal gallbladder and no acute changes. On May 31 she had a abdominal CT showing bilateral nephrolithiasis without acute findings. On May 24 she had a bedside ultrasound showing gallstones and some thickening of the gallbladder wall without sonographic Murphy sign.   03:20 pt is painfree, sitting up, looks more comfortable. We discussed getting a formal US to document if she has gallstones and she is agreeable. She has an appointment at 3:15 today.   Review the [place] shows patient gets #150 oxycodone 10/325 monthly from [facility], last filled on July 7    Labs Review Results for orders placed or performed during the hospital encounter of 08/22/14  Comprehensive metabolic panel  Result Value Ref Range   Sodium 139 135 - 145 mmol/L   Potassium  3.7 3.5 - 5.1 mmol/L   Chloride 101 101 - 111 mmol/L   CO2 28 22 - 32 mmol/L   Glucose, Bld 97 65 - 99 mg/dL   BUN 10 6 - 20 mg/dL   Creatinine, Ser 0.73 0.44 - 1.00 mg/dL   Calcium 9.2 8.9 - 10.3 mg/dL   Total Protein 8.1 6.5 - 8.1 g/dL   Albumin 4.3 3.5 - 5.0 g/dL   AST 20 15 - 41 U/L   ALT 20 14 - 54 U/L   Alkaline Phosphatase 42 38 - 126 U/L   Total Bilirubin 0.3 0.3 - 1.2 mg/dL   GFR calc non Af Amer >60 >60 mL/min   GFR calc Af Amer >60 >60 mL/min   Anion gap 10 5 - 15  CBC with Differential  Result Value Ref Range   WBC 8.7 4.0 - 10.5 K/uL   RBC 4.48 3.87 - 5.11 MIL/uL   Hemoglobin 13.3 12.0 - 15.0 g/dL   HCT 40.1 36.0 - 46.0 %   MCV 89.5 78.0 - 100.0 fL   MCH 29.7 26.0 - 34.0 pg   MCHC 33.2 30.0 - 36.0 g/dL   RDW 12.6 11.5 - 15.5 %   Platelets 376 150 - 400 K/uL   Neutrophils Relative % 53 43 - 77 %   Neutro Abs 4.7 1.7 - 7.7 K/uL   Lymphocytes Relative 37 12 - 46 %   Lymphs Abs 3.2 0.7 - 4.0 K/uL   Monocytes Relative 8 3 - 12 %   Monocytes Absolute 0.7 0.1 - 1.0 K/uL   Eosinophils Relative 1 0 - 5 %   Eosinophils Absolute 0.1 0.0 - 0.7 K/uL   Basophils Relative 1 0 - 1 %   Basophils Absolute 0.0 0.0 - 0.1 K/uL  Lipase, blood  Result Value Ref Range   Lipase 31 22 - 51 U/L  Urinalysis, Routine w reflex microscopic (not at [facility])  Result Value Ref Range   Color, Urine YELLOW YELLOW   APPearance HAZY (A) CLEAR   Specific Gravity, Urine 1.020 1.005 - 1.030   pH 6.0 5.0 - 8.0   Glucose, UA NEGATIVE NEGATIVE mg/dL   Hgb urine dipstick TRACE (A) NEGATIVE   Bilirubin  Urine NEGATIVE NEGATIVE   Ketones, ur NEGATIVE NEGATIVE mg/dL   Protein, ur NEGATIVE NEGATIVE mg/dL   Urobilinogen, UA 0.2 0.0 - 1.0 mg/dL   Nitrite NEGATIVE NEGATIVE   Leukocytes, UA SMALL (A) NEGATIVE  Urine microscopic-add on  Result Value Ref Range   Squamous Epithelial / LPF MANY (A) RARE   WBC, UA 7-10 <3 WBC/hpf   RBC / HPF 3-6 <3 RBC/hpf   Bacteria, UA MANY (A) RARE  I-Stat beta hCG  blood, ED  Result Value Ref Range   I-stat hCG, quantitative <5.0 <5 mIU/mL   Comment 3           Laboratory interpretation all normal      Imaging Review No results found.   EKG Interpretation   Date/Time:  Monday August 22 2014 01:17:18 EDT Ventricular Rate:  89 PR Interval:  142 QRS Duration: 95 QT Interval:  366 QTC Calculation: 445 R Axis:   39 Text Interpretation:  Sinus rhythm Electrode noise Baseline wander No  significant change since last tracing 21 Jun 2014 Confirmed by [facility]   MD-I, Lizbett Garciagarcia ([id number]) on 08/22/2014 1:36:29 AM      MDM   Final diagnoses:  Epigastric pain    Plan discharge  Rolland Porter, MD, Barbette Or, MD 08/22/14 [phone]

## 2014-08-22 NOTE — ED Notes (Signed)
Patient complaining of abdominal pain to mid upper abdomen. Reports stomach has been hurting all day, but has worsened. Denies nausea, vomiting, diarrhea.

## 2014-08-26 ENCOUNTER — Encounter: Payer: Self-pay | Admitting: Gastroenterology

## 2014-08-26 ENCOUNTER — Ambulatory Visit (INDEPENDENT_AMBULATORY_CARE_PROVIDER_SITE_OTHER): Payer: Medicaid Other | Admitting: Gastroenterology

## 2014-08-26 VITALS — BP 136/86 | HR 74 | Temp 97.6°F | Ht 68.0 in | Wt 269.0 lb

## 2014-08-26 DIAGNOSIS — R1013 Epigastric pain: Secondary | ICD-10-CM | POA: Diagnosis not present

## 2014-08-26 MED ORDER — LANSOPRAZOLE 30 MG PO CPDR: DELAYED_RELEASE_CAPSULE | ORAL | Status: DC

## 2014-08-26 NOTE — Progress Notes (Signed)
Referring Provider: Delphina Cahill, MD Primary Care Physician:  Raiford Simmonds., PA-C  Primary GI: Dr. Oneida Alar   Chief Complaint  Patient presents with  . Follow-up    HPI:   Haley Brady is a 29 y.o. female presenting today with a history of GERD, last EGD in 2013. Chronic GERD. Recent ultrasound July 25th noting cholelithiasis and fatty liver. LFTs, Lipase normal.     3 Prevacid every day but still with epigastric pain. Intermittent. Feels like someone is taking a knife, stabbing, twisting. Hurt so bad had to almost call 911. No association with eating/drinking. No N/V.  Has tried/failed multiple PPIs. No NSAIDs in several years other than RARE occasion.    Past Medical History  Diagnosis Date  . Panic disorder without agoraphobia   . Esophageal reflux   . Asymptomatic varicose veins   . Ingrowing nail   . Depression   . Chronic back pain   . Abnormal pap   . DDD (degenerative disc disease)   . Migraines   . Chronic nausea   . Renal disorder     Past Surgical History  Procedure Laterality Date  . Tonsillectomy    . Esophagogastroduodenoscopy  06/11/2011     SMALL HIATAL HERNIA/Moderate gastritis/  BURNING CHESTPAIN: GERD, NERD, OR NON-ULCER DYSPEPSIA/ Nl esophagus    Current Outpatient Prescriptions  Medication Sig Dispense Refill  . lansoprazole (PREVACID) 30 MG capsule TAKE 1 CAPSULE BY MOUTH 30 MINUTES PRIOR TO MEALS TWICE DAILY. (Patient taking differently: TAKE 1 CAPSULE BY MOUTH 30 MINUTES PRIOR TO MEALS Three DAILY.) 60 capsule 5  . oxyCODONE-acetaminophen (PERCOCET) 10-325 MG per tablet Take 1 tablet by mouth every 6 (six) hours as needed for pain.    Marland Kitchen ALPRAZolam (XANAX) 0.5 MG tablet Take 0.5 mg by mouth 2 (two) times daily as needed for sleep or anxiety.    . baclofen (LIORESAL) 10 MG tablet Take 1 tablet (10 mg total) by mouth 3 (three) times daily before meals. (Patient not taking: Reported on 08/26/2014) 93 tablet 11  . cephALEXin (KEFLEX) 500 MG  capsule Take 1 capsule (500 mg total) by mouth 4 (four) times daily. (Patient not taking: Reported on 06/21/2014) 40 capsule 0  . citalopram (CELEXA) 40 MG tablet Take 40 mg by mouth every morning.     Marland Kitchen ibuprofen (ADVIL,MOTRIN) 200 MG tablet Take 600-800 mg by mouth every 6 (six) hours as needed for mild pain. Pain    . ondansetron (ZOFRAN ODT) 4 MG disintegrating tablet 4mg  ODT q4 hours prn nausea/vomit (Patient not taking: Reported on 06/28/2014) 8 tablet 0  . promethazine (PHENERGAN) 25 MG tablet Take 1 tablet (25 mg total) by mouth every 6 (six) hours as needed for nausea or vomiting. (Patient not taking: Reported on 06/28/2014) 30 tablet 0  . promethazine (PHENERGAN) 25 MG tablet Take 1 tablet (25 mg total) by mouth every 6 (six) hours as needed. (Patient not taking: Reported on 08/26/2014) 12 tablet 1  . topiramate (TOPAMAX) 50 MG tablet Take 50 mg by mouth 2 (two) times daily.    . traMADol (ULTRAM) 50 MG tablet Take 1 tablet (50 mg total) by mouth every 6 (six) hours as needed. (Patient not taking: Reported on 08/26/2014) 20 tablet 0   No current facility-administered medications for this visit.    Allergies as of 08/26/2014  . (No Known Allergies)    Family History  Problem Relation Age of Onset  . Pancreatic cancer Father 36    History  Social History  . Marital Status: Single    Spouse Name: N/A  . Number of Children: 1  . Years of Education: N/A   Occupational History  . waitress    Social History Main Topics  . Smoking status: Heavy Tobacco Smoker -- 0.50 packs/day    Types: Cigarettes  . Smokeless tobacco: Not on file     Comment: pt reports '4 cigarettes per day."  . Alcohol Use: Yes     Comment: occasionally  . Drug Use: No  . Sexual Activity: Yes    Birth Control/ Protection: None, Condom   Other Topics Concern  . None   Social History Narrative   Lives w/ daughter-5    Review of Systems: As mentioned in HPI  Physical Exam: BP 136/86 mmHg  Pulse 74   Temp(Src) 97.6 F (36.4 C) (Oral)  Ht 5\' 8"  (1.727 m)  Wt 269 lb (122.018 kg)  BMI 40.91 kg/m2  LMP 08/15/2014 General:   Alert and oriented. No distress noted. Pleasant and cooperative.  Head:  Normocephalic and atraumatic. Eyes:  Conjuctiva clear without scleral icterus. Mouth:  Oral mucosa pink and moist. Good dentition. No lesions. Heart:  S1, S2 present without murmurs, rubs, or gallops. Regular rate and rhythm. Abdomen:  +BS, soft, mild TTP epigastric and non-distended. No rebound or guarding. No HSM or masses noted. Msk:  Symmetrical without gross deformities. Normal posture. Extremities:  Without edema. Neurologic:  Alert and  oriented x4;  grossly normal neurologically. Psych:  Alert and cooperative. Normal mood and affect.  Lab Results  Component Value Date   WBC 8.7 08/22/2014   HGB 13.3 08/22/2014   HCT 40.1 08/22/2014   MCV 89.5 08/22/2014   PLT 376 08/22/2014   Lab Results  Component Value Date   ALT 20 08/22/2014   AST 20 08/22/2014   ALKPHOS 42 08/22/2014   BILITOT 0.3 08/22/2014   Lab Results  Component Value Date   CREATININE 0.73 08/22/2014   BUN 10 08/22/2014   NA 139 08/22/2014   K 3.7 08/22/2014   CL 101 08/22/2014   CO2 28 08/22/2014   Lab Results  Component Value Date   LIPASE 31 08/22/2014

## 2014-08-26 NOTE — Patient Instructions (Signed)
I have refilled Prevacid.   We have set you up for a special scan of your gallbladder. You may or may not need referral to a surgeon.   Further recommendations to follow.

## 2014-08-28 NOTE — Progress Notes (Signed)
REVIEWED-NO ADDITIONAL RECOMMENDATIONS. 

## 2014-08-29 ENCOUNTER — Other Ambulatory Visit: Payer: Self-pay

## 2014-08-29 DIAGNOSIS — R1013 Epigastric pain: Secondary | ICD-10-CM

## 2014-08-30 ENCOUNTER — Encounter (HOSPITAL_COMMUNITY)
Admission: RE | Admit: 2014-08-30 | Discharge: 2014-08-30 | Disposition: A | Discharge status: Home / Self Care | Payer: Medicaid Other | Source: Ambulatory Visit | Attending: Gastroenterology | Admitting: Gastroenterology

## 2014-08-30 DIAGNOSIS — R1013 Epigastric pain: Secondary | ICD-10-CM

## 2014-08-31 NOTE — Assessment & Plan Note (Signed)
29 year old female with chronic dyspepsia, GERD, taking large doses of PPIs without improvement. [place] with gallstones. EGD on file from 2013. Doubt repeat EGD would shed any light on current process. Recommend HIDA to assess for biliary dyskinesia. Continue Prevacid BID dosing. Dietary and behavior modification recommended. If HIDA is normal, would consider EGD for further evaluation.

## 2014-08-31 NOTE — Progress Notes (Signed)
cc'ed to pcp °

## 2014-10-28 ENCOUNTER — Other Ambulatory Visit: Payer: Self-pay | Admitting: Gastroenterology

## 2015-02-16 ENCOUNTER — Telehealth: Payer: Self-pay | Admitting: Gastroenterology

## 2015-02-16 ENCOUNTER — Ambulatory Visit: Payer: Medicaid Other | Admitting: Gastroenterology

## 2015-02-16 NOTE — Telephone Encounter (Signed)
Pt was a no show

## 2015-02-17 ENCOUNTER — Encounter: Payer: Self-pay | Admitting: General Practice

## 2015-02-17 NOTE — Telephone Encounter (Signed)
3 no shows in less than one year. Consider sending a warning letter for discharge if future no shows/late cancellations.

## 2015-02-17 NOTE — Telephone Encounter (Signed)
Letter mailed

## 2015-03-01 ENCOUNTER — Emergency Department (HOSPITAL_COMMUNITY): Payer: Medicaid Other

## 2015-03-01 ENCOUNTER — Encounter (HOSPITAL_COMMUNITY): Payer: Self-pay | Admitting: Emergency Medicine

## 2015-03-01 ENCOUNTER — Emergency Department (HOSPITAL_COMMUNITY)
Admission: EM | Admit: 2015-03-01 | Discharge: 2015-03-02 | Disposition: A | Discharge status: Home / Self Care | Payer: Medicaid Other | Attending: Emergency Medicine | Admitting: Emergency Medicine

## 2015-03-01 DIAGNOSIS — F410 Panic disorder [episodic paroxysmal anxiety] without agoraphobia: Secondary | ICD-10-CM | POA: Insufficient documentation

## 2015-03-01 DIAGNOSIS — B9789 Other viral agents as the cause of diseases classified elsewhere: Secondary | ICD-10-CM

## 2015-03-01 DIAGNOSIS — Z79899 Other long term (current) drug therapy: Secondary | ICD-10-CM | POA: Insufficient documentation

## 2015-03-01 DIAGNOSIS — K121 Other forms of stomatitis: Secondary | ICD-10-CM | POA: Insufficient documentation

## 2015-03-01 DIAGNOSIS — F329 Major depressive disorder, single episode, unspecified: Secondary | ICD-10-CM | POA: Insufficient documentation

## 2015-03-01 DIAGNOSIS — Z8679 Personal history of other diseases of the circulatory system: Secondary | ICD-10-CM | POA: Insufficient documentation

## 2015-03-01 DIAGNOSIS — Z8739 Personal history of other diseases of the musculoskeletal system and connective tissue: Secondary | ICD-10-CM | POA: Insufficient documentation

## 2015-03-01 DIAGNOSIS — F17210 Nicotine dependence, cigarettes, uncomplicated: Secondary | ICD-10-CM | POA: Insufficient documentation

## 2015-03-01 DIAGNOSIS — Z87448 Personal history of other diseases of urinary system: Secondary | ICD-10-CM | POA: Insufficient documentation

## 2015-03-01 DIAGNOSIS — K219 Gastro-esophageal reflux disease without esophagitis: Secondary | ICD-10-CM | POA: Insufficient documentation

## 2015-03-01 DIAGNOSIS — G8929 Other chronic pain: Secondary | ICD-10-CM | POA: Diagnosis not present

## 2015-03-01 DIAGNOSIS — Z872 Personal history of diseases of the skin and subcutaneous tissue: Secondary | ICD-10-CM | POA: Insufficient documentation

## 2015-03-01 DIAGNOSIS — F419 Anxiety disorder, unspecified: Secondary | ICD-10-CM

## 2015-03-01 DIAGNOSIS — R05 Cough: Secondary | ICD-10-CM | POA: Diagnosis present

## 2015-03-01 DIAGNOSIS — J069 Acute upper respiratory infection, unspecified: Secondary | ICD-10-CM | POA: Insufficient documentation

## 2015-03-01 LAB — POC URINE PREG, ED: Preg Test, Ur: NEGATIVE

## 2015-03-01 MED ORDER — LIDOCAINE VISCOUS 2 % MT SOLN
15.0000 mL | Freq: Once | OROMUCOSAL | Status: AC
  Administered 2015-03-02: 15 mL via OROMUCOSAL
  Filled 2015-03-01: qty 15

## 2015-03-01 MED ORDER — LORAZEPAM 2 MG/ML IJ SOLN
1.0000 mg | Freq: Once | INTRAMUSCULAR | Status: AC
  Administered 2015-03-01: 1 mg via INTRAMUSCULAR
  Filled 2015-03-01: qty 1

## 2015-03-01 NOTE — ED Notes (Signed)
Pt ambulated to restroom with steady and even gait. 

## 2015-03-01 NOTE — ED Provider Notes (Signed)
CSN: [id number]     Arrival date & time 03/01/15  2248 History  By signing my name below, I, Hilda Lias, attest that this documentation has been prepared under the direction and in the presence of Lily Kocher, PA-C.  Electronically Signed: Hilda Lias, ED Scribe. 03/01/2015. 11:10 PM.    Chief Complaint  Patient presents with  . Cough   Patient is a 30 y.o. female presenting with cough. The history is provided by the patient. No language interpreter was used.  Cough Cough characteristics:  Non-productive Severity:  Mild Onset quality:  Sudden Duration:  14 hours Timing:  Constant Chronicity:  New Smoker: no   Relieved by:  None tried Worsened by:  Nothing tried Ineffective treatments:  None tried Associated symptoms: sore throat   Associated symptoms: no fever and no rash    HPI Comments: Haley Brady is a 30 y.o. female who presents to the Emergency Department complaining of an intermittent non-productive cough that has been present since this morning. Pt reports having a long hx of acid reflux, and states her reflux "turned into" a cough earlier today. Pt also reports that she bites her tongue when she gets stressed out, and states that her tongue has been hurting all day. Pt states she also has a constant sore throat. Pt states she went to urgent care earlier today for the same symptoms. Pt denies any unusual diet changes. Pt denies rash, fever.     Past Medical History  Diagnosis Date  . Panic disorder without agoraphobia   . Esophageal reflux   . Asymptomatic varicose veins   . Ingrowing nail   . Depression   . Chronic back pain   . Abnormal pap   . DDD (degenerative disc disease)   . Migraines   . Chronic nausea   . Renal disorder    Past Surgical History  Procedure Laterality Date  . Tonsillectomy    . Esophagogastroduodenoscopy  06/11/2011     SMALL HIATAL HERNIA/Moderate gastritis/  BURNING CHESTPAIN: GERD, NERD, OR NON-ULCER DYSPEPSIA/ Nl esophagus    Family History  Problem Relation Age of Onset  . Pancreatic cancer Father 51   Social History  Substance Use Topics  . Smoking status: Heavy Tobacco Smoker -- 0.50 packs/day    Types: Cigarettes  . Smokeless tobacco: Not on file     Comment: pt reports '4 cigarettes per day."  . Alcohol Use: Yes     Comment: occasionally   OB History    Gravida Para Term Preterm AB TAB SAB Ectopic Multiple Living            1     Review of Systems  Constitutional: Negative for fever.  HENT: Positive for sore throat. Negative for congestion.   Respiratory: Positive for cough.   Skin: Negative for rash.  All other systems reviewed and are negative.     Allergies  Review of patient's allergies indicates no known allergies.  Home Medications   Prior to Admission medications   Medication Sig Start Date End Date Taking? Authorizing Provider  ALPRAZolam Duanne Moron) 0.5 MG tablet Take 0.5 mg by mouth 2 (two) times daily as needed for sleep or anxiety.    Historical Provider, MD  baclofen (LIORESAL) 10 MG tablet Take 1 tablet (10 mg total) by mouth 3 (three) times daily before meals. Patient not taking: Reported on 08/26/2014 07/29/13   Orvil Feil, NP  cephALEXin (KEFLEX) 500 MG capsule Take 1 capsule (500 mg total) by mouth  4 (four) times daily. Patient not taking: Reported on 06/21/2014 [id number]   Delora Fuel, MD  citalopram (CELEXA) 40 MG tablet Take 40 mg by mouth every morning.     Historical Provider, MD  ibuprofen (ADVIL,MOTRIN) 200 MG tablet Take 600-800 mg by mouth every 6 (six) hours as needed for mild pain. Pain    Historical Provider, MD  lansoprazole (PREVACID) 30 MG capsule TAKE 1 CAPSULE BY MOUTH 30 MINUTES PRIOR TO MEALS TWICE DAILY. 11/02/14   Carlis Stable, NP  ondansetron (ZOFRAN ODT) 4 MG disintegrating tablet 4mg  ODT q4 hours prn nausea/vomit Patient not taking: Reported on 06/28/2014 06/21/14   Elnora Morrison, MD  oxyCODONE-acetaminophen (PERCOCET) 10-325 MG per tablet Take 1 tablet by  mouth every 6 (six) hours as needed for pain.    Historical Provider, MD  promethazine (PHENERGAN) 25 MG tablet Take 1 tablet (25 mg total) by mouth every 6 (six) hours as needed for nausea or vomiting. Patient not taking: Reported on 06/28/2014 06/03/14   Orpah Greek, MD  promethazine (PHENERGAN) 25 MG tablet Take 1 tablet (25 mg total) by mouth every 6 (six) hours as needed. Patient not taking: Reported on 08/26/2014 06/29/14   Fredia Sorrow, MD  topiramate (TOPAMAX) 50 MG tablet Take 50 mg by mouth 2 (two) times daily.    Historical Provider, MD  traMADol (ULTRAM) 50 MG tablet Take 1 tablet (50 mg total) by mouth every 6 (six) hours as needed. Patient not taking: Reported on 08/26/2014 06/29/14   Fredia Sorrow, MD   There were no vitals taken for this visit. Physical Exam  Constitutional: She is oriented to person, place, and time. She appears well-developed and well-nourished.  HENT:  Head: Normocephalic and atraumatic.  Uvula midline Small bump next to uvula with increased redness Airway patent Slapped cheek appearance Tongue: small ulcer on right side of the tongue Geographic tongue pattern present   Cardiovascular: Normal rate.   Pulmonary/Chest: Effort normal.  Lungs are clear Symmetrical rise and fall of the chest Pt speaks in complete sentences  Abdominal: She exhibits no distension.  Neurological: She is alert and oriented to person, place, and time.  Skin: Skin is warm and dry.  Psychiatric: She has a normal mood and affect. She expresses no homicidal and no suicidal ideation.  Pt is very anxious and easily tearful. She states that there are times when she loses track of a part of the day or small amount of time.  Nursing note and vitals reviewed.   ED Course  Patient anxious. Easily tearful, and at times sticking her tongue out complaining of pain, then thrashing in the bed, and then she will stop to answer a question or asked a question. When not being engaged,  or watched directly, she is carrying on a conversation with her mother.   Procedures (including critical care time)  DIAGNOSTIC STUDIES: Oxygen Saturation is 98% on room air, normal by my interpretation.    COORDINATION OF CARE: 11:05 PM Discussed treatment plan with pt at bedside and pt agreed to plan. Pt will receive X-ray on soft tissue of neck and chest. Pt will receive viscous lidocaine to gargle. Pt will also receive 1 mg of adavant for anxiety.    Labs Review Labs Reviewed - No data to display  Imaging Review No results found. I have personally reviewed and evaluated these images and lab results as part of my medical decision-making.   EKG Interpretation None      MDM  Vital  signs reviewed. X-ray of the chest is negative for acute problem. X-ray of the soft tissue neck is negative for acute problem. The patient was given Viscous Xylocaine to assist with her discomfort in the mouth, with some improvement. The patient remained anxious after intramuscular Ativan. The patient now states that she has had Valium in the past for this issue and it worked well. Valium 5 mg intramuscularly was given to the patient with some improvement. The patient was seen with me by Dr. Venora Maples.  The airway is patent. The patient speaks in complete sentences. Her anxiety is improving. There is no suicidal or homicidal ideation reported. Suspect that the patient has a stomatitis, upper respiratory infection, and anxiety. The patient is given a excuse from work duty until Monday. She is strongly encouraged to see her primary physician for assistance with her anxiety. The patient was given 10 tablets of Ativan to use until seen by the primary care physician. The patient will use Chloraseptic Spray to assist with some discomfort, as well as salt water gargles. Patient will also use Afrin every 8 hours for 5 days only for the congestion in the nasal passages.    Final diagnoses:  None    **I have reviewed  nursing notes, vital signs, and all appropriate lab and imaging results for this patient.*  **I personally performed the services described in this documentation, which was scribed in my presence. The recorded information has been reviewed and is accurate.Lily Kocher, PA-C 03/02/15 0041  Jola Schmidt, MD 03/02/15 [phone]

## 2015-03-01 NOTE — ED Notes (Signed)
Pt c/o cough, congestion, and sore throat.

## 2015-03-02 MED ORDER — IBUPROFEN 400 MG PO TABS
600.0000 mg | ORAL_TABLET | Freq: Once | ORAL | Status: AC
  Administered 2015-03-02: 600 mg via ORAL
  Filled 2015-03-02: qty 2

## 2015-03-02 MED ORDER — ZOLPIDEM TARTRATE 5 MG PO TABS: 5.0000 mg | ORAL_TABLET | Freq: Every evening | ORAL | Status: DC | PRN

## 2015-03-02 MED ORDER — DIAZEPAM 5 MG/ML IJ SOLN
5.0000 mg | Freq: Once | INTRAMUSCULAR | Status: AC
  Administered 2015-03-02: 5 mg via INTRAMUSCULAR
  Filled 2015-03-02: qty 2

## 2015-03-02 MED ORDER — OXYMETAZOLINE HCL 0.05 % NA SOLN
2.0000 | Freq: Once | NASAL | Status: AC
  Administered 2015-03-02: 2 via NASAL
  Filled 2015-03-02: qty 15

## 2015-03-02 MED ORDER — LORAZEPAM 1 MG PO TABS: 1.0000 mg | ORAL_TABLET | Freq: Three times a day (TID) | ORAL | Status: DC | PRN

## 2015-03-02 MED ORDER — IBUPROFEN 600 MG PO TABS: 600.0000 mg | ORAL_TABLET | Freq: Three times a day (TID) | ORAL | Status: DC | PRN

## 2015-03-02 NOTE — Discharge Instructions (Signed)
Your vital signs are well within normal limits. Your chest x-ray is normal. Your x-ray of the structures of the neck is normal. Your examination suggest an upper respiratory infection, a stomatitis in the mouth and throat. Please use afrin spray every 8 hours for 5 days only. Increase fluids. Wash hands frequently. Chloraseptic spray may be helpful for tongue discomfort. Please see your MD for recheck of your anxiety.  Stomatitis  Stomatitis is a condition that causes swelling (inflammation) in your mouth. It can affect a part of your mouth or your whole mouth. The condition often affects your cheek, teeth, gums, lips, and tongue. Stomatitis can also affect the mucous membranes that surround your mouth (mucosa). Pain from stomatitis can make it hard for you to eat or drink. Very bad cases of this condition can lead to not getting enough fluid in your body (dehydration) or poor nutrition. HOME CARE Medicines  Take medicines only as told by your doctor.  If you were prescribed an antibiotic, finish all of it even if you start to feel better. Lifestyle  Take good care of your mouth and teeth (oral hygiene):  Gently brush your teeth with a soft, nylon-bristled toothbrush two times each day.  Floss your teeth every day.  Have your teeth cleaned regularly. Do this as told by your dentist.  Eat a balanced diet. Do not eat:  Spicy foods.  Citrus, such as oranges.  Foods that have sharp edges, such as chips.  Avoid any foods or other things that you think may be causing this condition.  If you have dentures, make sure that they fit the way that they should.  Do not use any tobacco products, including cigarettes, chewing tobacco, or electronic cigarettes. If you need help quitting, ask your doctor.  Find ways to lower your stress. Try yoga or meditation. Ask your doctor for other ideas. General Instructions  Use a salt-water rinse for pain as told by your doctor. Mix 1 tsp of salt in 2  cups of water.  Drink enough fluid to keep your pee (urine) clear or pale yellow. This will keep you hydrated. GET HELP IF:  Your symptoms get worse.  You develop new symptoms, especially:  A rash.  New symptoms that do not involve your mouth area.  Your symptoms last longer than three weeks.  Your stomatitis goes away and then comes back.  You have a harder time eating and drinking normally.  You are more tired.  You feel weaker.  You stop feeling hungry.  You feel sick to your stomach (nauseous).  You have a fever.   This information is not intended to replace advice given to you by your health care provider. Make sure you discuss any questions you have with your health care provider.   Document Released: 01/03/2011 Document Revised: 05/31/2014 Document Reviewed: 01/10/2014 Elsevier Interactive Patient Education 2016 Elsevier [employer]  Viral Infections A virus is a type of germ. Viruses can cause:  Minor sore throats.  Aches and pains.  Headaches.  Runny nose.  Rashes.  Watery eyes.  Tiredness.  Coughs.  Loss of appetite.  Feeling sick to your stomach (nausea).  Throwing up (vomiting).  Watery poop (diarrhea). HOME CARE   Only take medicines as told by your doctor.  Drink enough water and fluids to keep your pee (urine) clear or pale yellow. Sports drinks are a good choice.  Get plenty of rest and eat healthy. Soups and broths with crackers or rice are fine. GET HELP  RIGHT AWAY IF:   You have a very bad headache.  You have shortness of breath.  You have chest pain or neck pain.  You have an unusual rash.  You cannot stop throwing up.  You have watery poop that does not stop.  You cannot keep fluids down.  You or your child has a temperature by mouth above 102 F (38.9 C), not controlled by medicine.  Your baby is older than 3 months with a rectal temperature of 102 F (38.9 C) or higher.  Your baby is 25 months old or younger with  a rectal temperature of 100.4 F (38 C) or higher. MAKE SURE YOU:   Understand these instructions.  Will watch this condition.  Will get help right away if you are not doing well or get worse.   This information is not intended to replace advice given to you by your health care provider. Make sure you discuss any questions you have with your health care provider.   Document Released: 12/28/2007 Document Revised: 04/08/2011 Document Reviewed: 06/22/2014 Elsevier Interactive Patient Education [employer].

## 2015-03-15 ENCOUNTER — Encounter: Payer: Self-pay | Admitting: Gastroenterology

## 2015-03-15 ENCOUNTER — Ambulatory Visit (INDEPENDENT_AMBULATORY_CARE_PROVIDER_SITE_OTHER): Payer: Medicaid Other | Admitting: Gastroenterology

## 2015-03-15 VITALS — BP 148/82 | HR 57 | Temp 98.1°F | Ht 64.0 in | Wt 261.6 lb

## 2015-03-15 DIAGNOSIS — K219 Gastro-esophageal reflux disease without esophagitis: Secondary | ICD-10-CM

## 2015-03-15 DIAGNOSIS — R1013 Epigastric pain: Secondary | ICD-10-CM | POA: Diagnosis not present

## 2015-03-15 NOTE — Progress Notes (Signed)
Primary Care Physician:  Raiford Simmonds., PA-C  Primary Gastroenterologist:  Barney Drain, MD   Chief Complaint  Patient presents with  . Abdominal Pain  . Gastroesophageal Reflux    HPI:  Haley Brady is a 30 y.o. female here for further evaluation of GERD, abdominal pain. Last seen in 07/2014. She has h/o difficult to control GERD, cholelithiasis, and fatty liver.   Complains of heartburn all the time no matter what she eats. Daytime and nocturnal symptoms. Currently taking prevacid 3 at one time in the morning. Complains of nausea and epigastric pain.  Has failed protonix, dexilant (3 per day), Nexium (3 per day), Aciphex, prilosec, addition of carafate and zantac.Didn't feel like bacofen helped.   No fried foods. Caffeine free soda. Wants to lose weight. Unable to walk too much do to back pain. Offered nutrition referral but she states shes already tried that. Wonders if medicaid will cover any weight loss procedures or pills.   Isolated gb attack twice. Symptoms different than her daily heartburn symptoms.   BM regular. No melena, brbpr.   Current Outpatient Prescriptions  Medication Sig Dispense Refill  . ibuprofen (ADVIL,MOTRIN) 600 MG tablet Take 1 tablet (600 mg total) by mouth every 8 (eight) hours as needed. 15 tablet 0  . lansoprazole (PREVACID) 30 MG capsule Take 30 mg by mouth daily at 12 noon. Patient states 3 at once in am.    . oxyCODONE-acetaminophen (PERCOCET) 10-325 MG tablet Take 1 tablet by mouth every 4 (four) hours as needed for pain.    Marland Kitchen topiramate (TOPAMAX) 50 MG tablet Take 50 mg by mouth 2 (two) times daily.     No current facility-administered medications for this visit.    Allergies as of 03/15/2015  . (No Known Allergies)    Past Medical History  Diagnosis Date  . Panic disorder without agoraphobia   . Esophageal reflux   . Asymptomatic varicose veins   . Ingrowing nail   . Depression   . Chronic back pain   . Abnormal pap   . DDD  (degenerative disc disease)   . Migraines   . Chronic nausea   . Renal disorder     Past Surgical History  Procedure Laterality Date  . Tonsillectomy    . Esophagogastroduodenoscopy  06/11/2011     SMALL HIATAL HERNIA/Moderate gastritis/  BURNING CHESTPAIN: GERD, NERD, OR NON-ULCER DYSPEPSIA/ Nl esophagus  . Bravo ph study  05/2011    on nexium 40mg  daily 217 episodes of reflux, Demeester score day 1: 51.9 and Day 2: 24.8.     Family History  Problem Relation Age of Onset  . Pancreatic cancer Father 10    Social History   Social History  . Marital Status: Single    Spouse Name: N/A  . Number of Children: 1  . Years of Education: N/A   Occupational History  . waitress    Social History Main Topics  . Smoking status: Heavy Tobacco Smoker -- 0.50 packs/day    Types: Cigarettes  . Smokeless tobacco: Not on file     Comment: pt reports '4 cigarettes per day."  . Alcohol Use: Yes     Comment: occasionally  . Drug Use: No  . Sexual Activity: Yes    Birth Control/ Protection: None, Condom   Other Topics Concern  . Not on file   Social History Narrative   Lives w/ daughter-5      ROS:  General: Negative for anorexia, weight loss, fever, chills, fatigue,  weakness. Eyes: Negative for vision changes.  ENT: Negative for hoarseness, difficulty swallowing , nasal congestion. CV: Negative for chest pain, angina, palpitations, dyspnea on exertion, peripheral edema.  Respiratory: Negative for dyspnea at rest, dyspnea on exertion, cough, sputum, wheezing.  GI: See history of present illness. GU:  Negative for dysuria, hematuria, urinary incontinence, urinary frequency, nocturnal urination.  MS: Negative for joint pain, low back pain.  Derm: Negative for rash or itching.  Neuro: Negative for weakness, abnormal sensation, seizure, frequent headaches, memory loss, confusion.  Psych: Negative for anxiety, depression, suicidal ideation, hallucinations.  Endo: Negative for unusual  weight change.  Heme: Negative for bruising or bleeding. Allergy: Negative for rash or hives.    Physical Examination:  BP 148/82 mmHg  Pulse 57  Temp(Src) 98.1 F (36.7 C)  Ht 5\' 4"  (1.626 m)  Wt 261 lb 9.6 oz (118.661 kg)  BMI 44.88 kg/m2  LMP 03/01/2015   General: Well-nourished, well-developed in no acute distress.  Head: Normocephalic, atraumatic.   Eyes: Conjunctiva pink, no icterus. Mouth: Oropharyngeal mucosa moist and pink , no lesions erythema or exudate. Neck: Supple without thyromegaly, masses, or lymphadenopathy.  Lungs: Clear to auscultation bilaterally.  Heart: Regular rate and rhythm, no murmurs rubs or gallops.  Abdomen: Bowel sounds are normal, nontender, nondistended, no hepatosplenomegaly or masses, no abdominal bruits or    hernia , no rebound or guarding.   Rectal: not performed Extremities: No lower extremity edema. No clubbing or deformities.  Neuro: Alert and oriented x 4 , grossly normal neurologically.  Skin: Warm and dry, no rash or jaundice.   Psych: Alert and cooperative, normal mood and affect.  Labs: Lab Results  Component Value Date   ALT 20 08/22/2014   AST 20 08/22/2014   ALKPHOS 42 08/22/2014   BILITOT 0.3 08/22/2014     Imaging Studies: Dg Neck Soft Tissue  03/01/2015  CLINICAL DATA:  Dyspnea and tongue pain.  Sore throat. EXAM: NECK SOFT TISSUES - 1+ VIEW COMPARISON:  None. FINDINGS: There is no evidence of retropharyngeal soft tissue swelling or epiglottic enlargement. The cervical airway is unremarkable. No radio-opaque foreign body identified. No abnormal soft tissue air. IMPRESSION: Negative soft tissue neck radiograph. Electronically Signed   By: Jeb Levering M.D.   On: 03/01/2015 23:45   Dg Chest 2 View  03/01/2015  CLINICAL DATA:  Dyspnea and nonproductive cough. EXAM: CHEST  2 VIEW COMPARISON:  06/27/2013 FINDINGS: The cardiomediastinal contours are normal. The lungs are clear. Pulmonary vasculature is normal. No  consolidation, pleural effusion, or pneumothorax. No acute osseous abnormalities are seen. IMPRESSION: No acute pulmonary process. Electronically Signed   By: Jeb Levering M.D.   On: 03/01/2015 23:45

## 2015-03-15 NOTE — Patient Instructions (Signed)
I will discuss your case with Dr. Oneida Alar and get back with you for further recommendations.

## 2015-03-19 ENCOUNTER — Encounter: Payer: Self-pay | Admitting: Gastroenterology

## 2015-03-19 NOTE — Assessment & Plan Note (Signed)
Refractory GERD, previously apparently did well on omeprazole 40mg  bid but had to switch due to drug interaction with Celexa. Currently no longer on celexa. She is somewhat hesitant to try any options offered today, states she has already tried everything. We discussed need for weight reduction, "I had reflux when I was skinny too". She takes percocet four times daily which is likely contributing to reflux in way of delayed gastric emptying.   We briefly dicussed option of surgical consultation for antireflux surgery. She is not interested at this time. She is interested in pursuing weight loss options.   To discuss with Dr. Oneida Alar regarding weight loss options. Would also consider trying omeprazole 40mg  bid again. To discuss with patient.

## 2015-03-20 ENCOUNTER — Telehealth: Payer: Self-pay | Admitting: Gastroenterology

## 2015-03-20 NOTE — Progress Notes (Signed)
cc'ed to pcp °

## 2015-03-20 NOTE — Telephone Encounter (Signed)
PATIENT CALLED INQUIRING ABOUT THE NEXT STEPS TO TAKE IN HER TREATMENT.  WONDERING IF SHE IS GOING TO HAVE A PROCEDURE

## 2015-03-21 NOTE — Telephone Encounter (Signed)
Pt called and was informed. She said she is having stress and will go back to see the doctor soon and he will probably put her back on the Celexa.  She also said that when she was on the Omeprazole she would have to take 2-3 tablets at the time just to get relief. She said anything that she takes, she has to take 2-3 tabets at the time for it to help.  Please advise!

## 2015-03-21 NOTE — Telephone Encounter (Signed)
I looked back through her records. We had documented that she did well on omeprazole 40mg  BID at one point but we had to switch due to drug interaction with Celexa that she was on at the time.   I would recommend short trial of omeprazole 40mg  BID before further work up if she is agreeable.

## 2015-03-21 NOTE — Telephone Encounter (Signed)
LM for pt to call

## 2015-03-27 NOTE — Telephone Encounter (Signed)
Please let patient know I will address with Dr. Oneida Alar on her return. I have nothing else to offer right now. She states she has failed all PPIs, Baclofen too.   Dr. Oneida Alar, do you have any further recommendations for refractory GERD?

## 2015-03-28 ENCOUNTER — Other Ambulatory Visit: Payer: Self-pay

## 2015-03-28 DIAGNOSIS — K219 Gastro-esophageal reflux disease without esophagitis: Secondary | ICD-10-CM

## 2015-03-28 NOTE — Telephone Encounter (Signed)
Referral has been made.

## 2015-03-28 NOTE — Telephone Encounter (Signed)
REVIEWED-NO ADDITIONAL RECOMMENDATIONS. 

## 2015-03-28 NOTE — Telephone Encounter (Signed)
I called and told pt. She said she takes 3 of the Prevacid 30 mg tablets at once and it does not help. She said OK to refer to [facility].  She did not say she wanted a referral to mental health.

## 2015-03-28 NOTE — Telephone Encounter (Signed)
PLEASE CALL PT. IF SHE WANTS TO FEEL BETTER, SHE NEEDS TO EAT RIGHT, TAKE HER MEDS, AND LOSE WEIGHT. SHE SHOULD Continue Prevacid BID dosing. SHE MAY ALSO WANT TO SEE A MENTAL HEALTH SPECIALIST IF SHE IS FEELING ANXIOUS OR STRESSED. PT HAS NO WARNING SIGN AND HAS NO INDICATION FOR ENDOSCOPY AT THIS TIME. OPV IN 3 MOS. YOU CAN REFER HER TO [facility] FOR A SECOND OPINION.

## 2016-03-06 ENCOUNTER — Emergency Department (HOSPITAL_COMMUNITY)
Admission: EM | Admit: 2016-03-06 | Discharge: 2016-03-06 | Disposition: A | Discharge status: Home / Self Care | Payer: Medicaid Other | Attending: Emergency Medicine | Admitting: Emergency Medicine

## 2016-03-06 ENCOUNTER — Encounter (HOSPITAL_COMMUNITY): Payer: Self-pay | Admitting: Emergency Medicine

## 2016-03-06 DIAGNOSIS — O26899 Other specified pregnancy related conditions, unspecified trimester: Secondary | ICD-10-CM | POA: Diagnosis not present

## 2016-03-06 DIAGNOSIS — Z3A00 Weeks of gestation of pregnancy not specified: Secondary | ICD-10-CM | POA: Insufficient documentation

## 2016-03-06 DIAGNOSIS — R1084 Generalized abdominal pain: Secondary | ICD-10-CM | POA: Insufficient documentation

## 2016-03-06 DIAGNOSIS — O99330 Smoking (tobacco) complicating pregnancy, unspecified trimester: Secondary | ICD-10-CM | POA: Diagnosis not present

## 2016-03-06 DIAGNOSIS — Z3490 Encounter for supervision of normal pregnancy, unspecified, unspecified trimester: Secondary | ICD-10-CM

## 2016-03-06 DIAGNOSIS — F17210 Nicotine dependence, cigarettes, uncomplicated: Secondary | ICD-10-CM | POA: Insufficient documentation

## 2016-03-06 LAB — URINALYSIS, ROUTINE W REFLEX MICROSCOPIC
Bilirubin Urine: NEGATIVE
Glucose, UA: NEGATIVE mg/dL
HGB URINE DIPSTICK: NEGATIVE
KETONES UR: NEGATIVE mg/dL
Leukocytes, UA: NEGATIVE
NITRITE: NEGATIVE
Protein, ur: NEGATIVE mg/dL
SPECIFIC GRAVITY, URINE: 1.017 (ref 1.005–1.030)
pH: 5 (ref 5.0–8.0)

## 2016-03-06 LAB — PREGNANCY, URINE: PREG TEST UR: POSITIVE — AB

## 2016-03-06 NOTE — ED Provider Notes (Signed)
[facility] DEPT Provider Note   CSN: [id number] Arrival date & time: 03/06/16  2212   By signing my name below, I, Dolores Hoose, attest that this documentation has been prepared under the direction and in the presence of Ripley Fraise, MD . Electronically Signed: Dolores Hoose, Scribe. 03/06/2016. 11:05 PM.  History   Chief Complaint Chief Complaint  Patient presents with  . Flank Pain   The history is provided by the patient. No language interpreter was used.  Flank Pain  This is a new problem. The current episode started yesterday. The problem occurs constantly. The problem has not changed since onset.Associated symptoms include abdominal pain. Associated symptoms comments: Vomiting. Nothing aggravates the symptoms. Nothing relieves the symptoms. She has tried nothing for the symptoms. The treatment provided no relief.   HPI Comments:  Haley Brady is a 31 y.o. female with no pertinent pmhx who presents to the Emergency Department complaining of constant, mild bilateral flank pain beginning yesterday. She reports associated lower abdominal pain. LNMP: December 2017. She denies any fever, cough, dysuria, vaginal bleeding or vaginal discharge.   Past Medical History:  Diagnosis Date  . Abnormal pap   . Asymptomatic varicose veins   . Chronic back pain   . Chronic nausea   . DDD (degenerative disc disease)   . Depression   . Esophageal reflux   . Ingrowing nail   . Migraines   . Panic disorder without agoraphobia   . Renal disorder     Patient Active Problem List   Diagnosis Date Noted  . Dyspepsia 08/26/2014  . Chronic nausea 05/28/2011  . URINALYSIS, ABNORMAL 08/06/2006  . MRSA INFECTION 05/23/2006  . OBESITY NOS 05/23/2006  . ANXIETY 05/23/2006  . MIGRAINE HEADACHE 05/23/2006  . GERD 05/23/2006  . LOW BACK PAIN, CHRONIC 05/23/2006    Past Surgical History:  Procedure Laterality Date  . BRAVO [place] STUDY  05/2011   on nexium 40mg  daily 217 episodes of  reflux, Demeester score day 1: 51.9 and Day 2: 24.8.   Marland Kitchen CHOLECYSTECTOMY    . ESOPHAGOGASTRODUODENOSCOPY  06/11/2011    SMALL HIATAL HERNIA/Moderate gastritis/  BURNING CHESTPAIN: GERD, NERD, OR NON-ULCER DYSPEPSIA/ Nl esophagus  . TONSILLECTOMY      OB History    Gravida Para Term Preterm AB Living             1   SAB TAB Ectopic Multiple Live Births                   Home Medications    Prior to Admission medications   Medication Sig Start Date End Date Taking? Authorizing Provider  lansoprazole (PREVACID) 30 MG capsule Take 30 mg by mouth daily at 12 noon. Patient states 3 at once in am.    Historical Provider, MD  topiramate (TOPAMAX) 50 MG tablet Take 50 mg by mouth 2 (two) times daily.    Historical Provider, MD    Family History Family History  Problem Relation Age of Onset  . Pancreatic cancer Father 60    Social History Social History  Substance Use Topics  . Smoking status: Heavy Tobacco Smoker    Packs/day: 0.50    Types: Cigarettes  . Smokeless tobacco: Never Used     Comment: pt reports '4 cigarettes per day."  . Alcohol use Yes     Comment: occasionally     Allergies   Patient has no known allergies.   Review of Systems Review of Systems  Constitutional: Negative  for fever.  Respiratory: Negative for cough.   Gastrointestinal: Positive for abdominal pain.  Genitourinary: Positive for flank pain. Negative for dysuria, vaginal bleeding and vaginal discharge.  All other systems reviewed and are negative.    Physical Exam Updated Vital Signs BP 158/97 (BP Location: Left Arm)   Pulse 79   Temp 98.6 F (37 C) (Oral)   Resp 18   Ht 5\' 4"  (1.626 m)   Wt 220 lb (99.8 kg)   LMP 01/12/2016 (Approximate)   SpO2 100%   BMI 37.76 kg/m   Physical Exam CONSTITUTIONAL: Well developed/well nourished HEAD: Normocephalic/atraumatic EYES: EOMI/PERRL ENMT: Mucous membranes moist NECK: supple no meningeal signs SPINE/BACK:entire spine nontender CV:  S1/S2 noted, no murmurs/rubs/gallops noted LUNGS: Lungs are clear to auscultation bilaterally, no apparent distress ABDOMEN: soft, nontender, no rebound or guarding, bowel sounds noted throughout abdomen GU:no cva tenderness NEURO: Pt is awake/alert/appropriate, moves all extremitiesx4.  No facial droop.   EXTREMITIES: pulses normal/equal, full ROM SKIN: warm, color normal PSYCH: no abnormalities of mood noted, alert and oriented to situation   ED Treatments / Results  DIAGNOSTIC STUDIES:  Oxygen Saturation is 100% on RA, normal by my interpretation.    COORDINATION OF CARE:  11:16 PM Discussed treatment plan with pt at bedside which includes positive pregnancy test result and ultrasound and pt agreed to plan.  Labs (all labs ordered are listed, but only abnormal results are displayed) Labs Reviewed  URINALYSIS, ROUTINE W REFLEX MICROSCOPIC - Abnormal; Notable for the following:       Result Value   APPearance HAZY (*)    All other components within normal limits  PREGNANCY, URINE - Abnormal; Notable for the following:    Preg Test, Ur POSITIVE (*)    All other components within normal limits    EKG  EKG Interpretation None       Radiology No results found.  Procedures Procedures (including critical care time)  Medications Ordered in ED Medications - No data to display   Initial Impression / Assessment and Plan / ED Course  I have reviewed the triage vital signs and the nursing notes.  Pertinent labs  results that were available during my care of the patient were reviewed by me and considered in my medical decision making (see chart for details).     11:23 PM Pt well appearing I am unable to visualize IUP by bedside ultrasound, likely due to early nature of pregnancy I advised that we would need to proceed with formal ultrasound imaging which likely includes TV ultrasound Pt declines, she would like to be discharged and f/u with OBGYN this week Advised that  ectopic pregnancy is possible (though unlikely) she understands this risk and will return if any worsened abdominal pain/ vaginal bleeding Advised to quit smoking and stop all pain meds  Final Clinical Impressions(s) / ED Diagnoses   Final diagnoses:  Generalized abdominal pain  Pregnancy, unspecified gestational age    New Prescriptions New Prescriptions   No medications on file  I personally performed the services described in this documentation, which was scribed in my presence. The recorded information has been reviewed and is accurate.        Ripley Fraise, MD 03/06/16 2325

## 2016-03-06 NOTE — ED Triage Notes (Signed)
Patient complaining of right flank pain since last night. States she had vomiting on Saturday night but has resolved. States "I could be pregnant I just don't know." States she did not have menstrual cycle in January.

## 2016-03-06 NOTE — Discharge Instructions (Signed)

## 2016-03-11 ENCOUNTER — Telehealth: Payer: Self-pay | Admitting: *Deleted

## 2016-03-11 NOTE — Telephone Encounter (Signed)
Patient called with complaints of light spotting and very mild cramping "here and there". Informed patient that spotting can be normal in early pregnancy and since she is not having severe cramping is a good sign. Advised patient to keep her appointment on Friday but to call us back or go to hospital if she starts to have severe cramping and heavy bleeding. Pt verbalized understanding.

## 2016-03-14 ENCOUNTER — Emergency Department (HOSPITAL_COMMUNITY): Payer: Medicaid Other

## 2016-03-14 ENCOUNTER — Emergency Department (HOSPITAL_COMMUNITY)
Admission: EM | Admit: 2016-03-14 | Discharge: 2016-03-14 | Disposition: A | Discharge status: Home / Self Care | Payer: Medicaid Other | Attending: Emergency Medicine | Admitting: Emergency Medicine

## 2016-03-14 ENCOUNTER — Encounter (HOSPITAL_COMMUNITY): Payer: Self-pay | Admitting: Emergency Medicine

## 2016-03-14 ENCOUNTER — Telehealth: Payer: Self-pay | Admitting: *Deleted

## 2016-03-14 DIAGNOSIS — F17210 Nicotine dependence, cigarettes, uncomplicated: Secondary | ICD-10-CM | POA: Insufficient documentation

## 2016-03-14 DIAGNOSIS — Z3A08 8 weeks gestation of pregnancy: Secondary | ICD-10-CM | POA: Diagnosis not present

## 2016-03-14 DIAGNOSIS — O209 Hemorrhage in early pregnancy, unspecified: Secondary | ICD-10-CM | POA: Insufficient documentation

## 2016-03-14 DIAGNOSIS — O99331 Smoking (tobacco) complicating pregnancy, first trimester: Secondary | ICD-10-CM | POA: Insufficient documentation

## 2016-03-14 DIAGNOSIS — R102 Pelvic and perineal pain: Secondary | ICD-10-CM | POA: Diagnosis not present

## 2016-03-14 DIAGNOSIS — N939 Abnormal uterine and vaginal bleeding, unspecified: Secondary | ICD-10-CM

## 2016-03-14 DIAGNOSIS — Z79899 Other long term (current) drug therapy: Secondary | ICD-10-CM | POA: Diagnosis not present

## 2016-03-14 DIAGNOSIS — R52 Pain, unspecified: Secondary | ICD-10-CM

## 2016-03-14 LAB — BASIC METABOLIC PANEL
Anion gap: 9 (ref 5–15)
BUN: 9 mg/dL (ref 6–20)
CO2: 25 mmol/L (ref 22–32)
CREATININE: 0.71 mg/dL (ref 0.44–1.00)
Calcium: 9.1 mg/dL (ref 8.9–10.3)
Chloride: 107 mmol/L (ref 101–111)
GFR calc Af Amer: >60 mL/min (ref >60)
GFR calc non Af Amer: >60 mL/min (ref >60)
GLUCOSE: 97 mg/dL (ref 65–99)
Potassium: 3.6 mmol/L (ref 3.5–5.1)
SODIUM: 141 mmol/L (ref 135–145)

## 2016-03-14 LAB — HCG, QUANTITATIVE, PREGNANCY: hCG, Beta Chain, Quant, S: 11 m[IU]/mL — ABNORMAL HIGH (ref <5)

## 2016-03-14 LAB — CBC WITH DIFFERENTIAL/PLATELET
Basophils Absolute: 0 10*3/uL (ref 0.0–0.1)
Basophils Relative: 0 %
EOS ABS: 0.1 10*3/uL (ref 0.0–0.7)
Eosinophils Relative: 1 %
HCT: 43.3 % (ref 36.0–46.0)
Hemoglobin: 14.6 g/dL (ref 12.0–15.0)
LYMPHS ABS: 2 10*3/uL (ref 0.7–4.0)
LYMPHS PCT: 20 %
MCH: 29.6 pg (ref 26.0–34.0)
MCHC: 33.7 g/dL (ref 30.0–36.0)
MCV: 87.8 fL (ref 78.0–100.0)
MONO ABS: 0.7 10*3/uL (ref 0.1–1.0)
Monocytes Relative: 6 %
Neutro Abs: 7.4 10*3/uL (ref 1.7–7.7)
Neutrophils Relative %: 73 %
PLATELETS: 365 10*3/uL (ref 150–400)
RBC: 4.93 MIL/uL (ref 3.87–5.11)
RDW: 12.9 % (ref 11.5–15.5)
WBC: 10.1 10*3/uL (ref 4.0–10.5)

## 2016-03-14 MED ORDER — SODIUM CHLORIDE 0.9 % IV BOLUS (SEPSIS)
1000.0000 mL | Freq: Once | INTRAVENOUS | Status: AC
  Administered 2016-03-14: 1000 mL via INTRAVENOUS

## 2016-03-14 MED ORDER — MORPHINE SULFATE (PF) 4 MG/ML IV SOLN
4.0000 mg | Freq: Once | INTRAVENOUS | Status: AC
  Administered 2016-03-14: 4 mg via INTRAVENOUS
  Filled 2016-03-14: qty 1

## 2016-03-14 NOTE — ED Notes (Signed)
Pr returns from ultrasound

## 2016-03-14 NOTE — ED Triage Notes (Signed)
"  Spotting all week", cramping started this am.  [redacted] weeks pregnant.  Having to wear pad with large amount of bleeding, not blood clots noted.  Denies any other issues or discomfort.

## 2016-03-14 NOTE — ED Notes (Signed)
approximately  [redacted] weeks pregnant, some spotting this week, Call OB, was told unless bleeding or cramping pt continue to watch. This morning abdominal cramps and bleeding.

## 2016-03-14 NOTE — ED Notes (Signed)
Patient given discharge instruction, verbalized understand. IV removed, band aid applied. Patient ambulatory out of the department.  

## 2016-03-14 NOTE — Discharge Instructions (Signed)
Follow-up with your OB/GYN tomorrow for recheck. If you have severe pain or severe bleeding tonight then head to [facility]

## 2016-03-14 NOTE — Telephone Encounter (Signed)
Patient called stating she is having increased bleeding and cramping. She wants to be seen today to find out what is going on. Patient states she is at [facility] ED waiting to been seen but doesn't want to wait in waiting room with other sick people. Informed patient that if this was a miscarriage, there was not anything we could do to prevent it. Spoke with Manus Gunning, CNM who stated patient would need an ultrasound and was probably better to be seen there since we could not get her in today for an ultrasound. Patient verbalized understanding. Will wait in ED to be seen.

## 2016-03-14 NOTE — ED Notes (Signed)
Pt waiting for pain recheck.

## 2016-03-14 NOTE — ED Provider Notes (Signed)
[facility] DEPT Provider Note   CSN: [id number] Arrival date & time: 03/14/16  1149   By signing my name below, I, Haley Brady, attest that this documentation has been prepared under the direction and in the presence of Haley Ferguson, MD  Electronically Signed: Delton Prairie, ED Scribe. 03/14/16. 1:38 PM.   History   Chief Complaint Chief Complaint  Patient presents with  . Vaginal Bleeding   The history is provided by the patient. No language interpreter was used.  Vaginal Bleeding  Primary symptoms include vaginal bleeding. There has been no fever. This is a new problem. The current episode started 1 to 2 hours ago. The problem occurs constantly. The problem has not changed since onset.The symptoms occur at rest. She is pregnant. Associated symptoms include abdominal pain. Pertinent negatives include no diarrhea and no frequency. She has tried nothing for the symptoms.   HPI Comments:  Haley Brady is a 31 y.o. female who presents to the Emergency Department complaining of moderate vaginal bleeding onset today. Pt also reports abdominal pain which she describes as a cramping. Pt is [redacted] weeks pregnant. No alleviating factors noted. She notes she has had 1 previous pregnancy ten years ago without experiencing similar symptoms. Pt denies any other associated symptoms. No other complaints noted.   [facility]   Past Medical History:  Diagnosis Date  . Abnormal pap   . Asymptomatic varicose veins   . Chronic back pain   . Chronic nausea   . DDD (degenerative disc disease)   . Depression   . Esophageal reflux   . Ingrowing nail   . Migraines   . Panic disorder without agoraphobia   . Renal disorder     Patient Active Problem List   Diagnosis Date Noted  . Dyspepsia 08/26/2014  . Chronic nausea 05/28/2011  . URINALYSIS, ABNORMAL 08/06/2006  . MRSA INFECTION 05/23/2006  . OBESITY NOS 05/23/2006  . ANXIETY 05/23/2006  . MIGRAINE HEADACHE 05/23/2006  . GERD  05/23/2006  . LOW BACK PAIN, CHRONIC 05/23/2006    Past Surgical History:  Procedure Laterality Date  . BRAVO [place] STUDY  05/2011   on nexium 40mg  daily 217 episodes of reflux, Demeester score day 1: 51.9 and Day 2: 24.8.   Marland Kitchen CHOLECYSTECTOMY    . ESOPHAGOGASTRODUODENOSCOPY  06/11/2011    SMALL HIATAL HERNIA/Moderate gastritis/  BURNING CHESTPAIN: GERD, NERD, OR NON-ULCER DYSPEPSIA/ Nl esophagus  . TONSILLECTOMY      OB History    Gravida Para Term Preterm AB Living             1   SAB TAB Ectopic Multiple Live Births                   Home Medications    Prior to Admission medications   Medication Sig Start Date End Date Taking? Authorizing Provider  lansoprazole (PREVACID) 30 MG capsule Take 30 mg by mouth daily at 12 noon. Patient states 3 at once in am.    Historical Provider, MD  topiramate (TOPAMAX) 50 MG tablet Take 50 mg by mouth 2 (two) times daily.    Historical Provider, MD    Family History Family History  Problem Relation Age of Onset  . Pancreatic cancer Father 83    Social History Social History  Substance Use Topics  . Smoking status: Heavy Tobacco Smoker    Packs/day: 0.50    Types: Cigarettes  . Smokeless tobacco: Never Used     Comment: pt  reports '4 cigarettes per day."  . Alcohol use Yes     Comment: occasionally     Allergies   Patient has no known allergies.   Review of Systems Review of Systems  Constitutional: Negative for appetite change and fatigue.  HENT: Negative for congestion, ear discharge and sinus pressure.   Eyes: Negative for discharge.  Respiratory: Negative for cough.   Cardiovascular: Negative for chest pain.  Gastrointestinal: Positive for abdominal pain. Negative for diarrhea.  Genitourinary: Positive for vaginal bleeding. Negative for frequency and hematuria.  Musculoskeletal: Negative for back pain.  Skin: Negative for rash.  Neurological: Negative for seizures and headaches.  Psychiatric/Behavioral: Negative for  hallucinations.     Physical Exam Updated Vital Signs BP 159/100 (BP Location: Left Arm)   Pulse 98   Temp 98.4 F (36.9 C) (Oral)   Resp 20   Ht 5\' 4"  (1.626 m)   Wt 220 lb (99.8 kg)   LMP 03/11/2016 (Exact Date)   SpO2 98%   BMI 37.76 kg/m   Physical Exam  Constitutional: She is oriented to person, place, and time. She appears well-developed.  HENT:  Head: Normocephalic.  Eyes: Conjunctivae and EOM are normal. No scleral icterus.  Neck: Neck supple. No thyromegaly present.  Cardiovascular: Normal rate and regular rhythm.  Exam reveals no gallop and no friction rub.   No murmur heard. Pulmonary/Chest: No stridor. She has no wheezes. She has no rales. She exhibits no tenderness.  Abdominal: She exhibits no distension. There is tenderness (moderate) in the suprapubic area. There is no rebound.  Genitourinary:  Genitourinary Comments: Patient with moderate vaginal bleeding. Moderate tenderness to uterus  Musculoskeletal: Normal range of motion. She exhibits no edema.  Lymphadenopathy:    She has no cervical adenopathy.  Neurological: She is oriented to person, place, and time. She exhibits normal muscle tone. Coordination normal.  Skin: No rash noted. No erythema.  Psychiatric: She has a normal mood and affect. Her behavior is normal.   ED Treatments / Results  DIAGNOSTIC STUDIES:  Oxygen Saturation is 98% on RA, normal by my interpretation.    COORDINATION OF CARE:  1:35 PM Discussed treatment plan with pt at bedside and pt agreed to plan.  Labs (all labs ordered are listed, but only abnormal results are displayed) Labs Reviewed - No data to display  EKG  EKG Interpretation None       Radiology No results found.  Procedures Procedures (including critical care time)  Medications Ordered in ED Medications - No data to display   Initial Impression / Assessment and Plan / ED Course  I have reviewed the triage vital signs and the nursing  notes.  Pertinent labs & imaging results that were available during my care of the patient were reviewed by me and considered in my medical decision making (see chart for details).     Patient with positive pregnancy test. She thinks she is [redacted] weeks pregnant. Quantitative beta-hCG only was 11. An ultrasound does not see any products of conception. I suspect patient has had a miscarriage. She will take Percocet for pain. She has pain medicine at home. She is to follow-up with her OB/GYN doctor tomorrow  Final Clinical Impressions(s) / ED Diagnoses   Final diagnoses:  None    New Prescriptions New Prescriptions   No medications on file  The chart was scribed for me under my direct supervision.  I personally performed the history, physical, and medical decision making and all procedures in the  evaluation of this patient.Haley Ferguson, MD 03/14/16 [phone]

## 2016-03-15 ENCOUNTER — Ambulatory Visit (INDEPENDENT_AMBULATORY_CARE_PROVIDER_SITE_OTHER): Payer: Medicaid Other | Admitting: Adult Health

## 2016-03-15 ENCOUNTER — Encounter: Payer: Self-pay | Admitting: Adult Health

## 2016-03-15 VITALS — BP 140/90 | HR 72 | Ht 64.0 in | Wt 248.0 lb

## 2016-03-15 DIAGNOSIS — R252 Cramp and spasm: Secondary | ICD-10-CM | POA: Diagnosis not present

## 2016-03-15 DIAGNOSIS — Z3490 Encounter for supervision of normal pregnancy, unspecified, unspecified trimester: Secondary | ICD-10-CM

## 2016-03-15 DIAGNOSIS — Z3202 Encounter for pregnancy test, result negative: Secondary | ICD-10-CM | POA: Diagnosis not present

## 2016-03-15 DIAGNOSIS — N939 Abnormal uterine and vaginal bleeding, unspecified: Secondary | ICD-10-CM | POA: Diagnosis not present

## 2016-03-15 LAB — POCT URINE PREGNANCY: Preg Test, Ur: NEGATIVE

## 2016-03-15 NOTE — Progress Notes (Signed)
Subjective:     Patient ID: Haley Brady, female   DOB: 1985/12/08, 31 y.o.   MRN: [id number]  HPI Haley Brady is a 31 year old white female in for UPT.She has had spotting all week and was seen in ER at [facility] yesterday,where [facility] was 11 and US showed no IUP,YS or fetal pole.She had had 4+HPT about 10 days ago.  Review of Systems +vaginal bleeding and cramps Reviewed past medical,surgical, social and family history. Reviewed medications and allergies.     Objective:   Physical Exam BP 140/90 (BP Location: Left Arm, Patient Position: Sitting, Cuff Size: Large)   Pulse 72   Ht 5\' 4"  (1.626 m)   Wt 248 lb (112.5 kg)   LMP 03/11/2016 (Exact Date)   BMI 42.57 kg/m  UPT negative. PHQ 2 score 0. Skin warm and dry. Lungs: clear to ausculation bilaterally. Cardiovascular: regular rate and rhythm.   Will get [facility] in am and progesterone level, and then see back in office in 4 days. She is aware if numbers dropping miscarriage and if rising will have to follow, with blood QHCG and may repeat US. She says her blood type is A+.  Assessment:     Vaginal bleeding  Pregnant, ?miscarriage    Plan:     Check QHCG and progesterone in am Return in 4 days for follow up

## 2016-03-20 ENCOUNTER — Ambulatory Visit: Payer: Medicaid Other | Admitting: Adult Health

## 2016-03-27 ENCOUNTER — Ambulatory Visit: Payer: Medicaid Other | Admitting: Adult Health

## 2016-03-27 ENCOUNTER — Encounter: Payer: Self-pay | Admitting: *Deleted

## 2016-04-11 ENCOUNTER — Emergency Department (HOSPITAL_COMMUNITY): Payer: Medicaid Other

## 2016-04-11 ENCOUNTER — Emergency Department (HOSPITAL_COMMUNITY)
Admission: EM | Admit: 2016-04-11 | Discharge: 2016-04-11 | Disposition: A | Discharge status: Other Acute Inpatient Hospital | Payer: Medicaid Other | Attending: Emergency Medicine | Admitting: Emergency Medicine

## 2016-04-11 ENCOUNTER — Encounter (HOSPITAL_COMMUNITY): Payer: Self-pay | Admitting: *Deleted

## 2016-04-11 ENCOUNTER — Inpatient Hospital Stay (HOSPITAL_COMMUNITY)
Admission: AD | Admit: 2016-04-11 | Discharge: 2016-04-16 | DRG: 885 | Disposition: A | Discharge status: Home / Self Care | Payer: Medicaid Other | Source: Intra-hospital | Attending: Psychiatry | Admitting: Psychiatry

## 2016-04-11 ENCOUNTER — Encounter (HOSPITAL_COMMUNITY): Payer: Self-pay

## 2016-04-11 DIAGNOSIS — Z79899 Other long term (current) drug therapy: Secondary | ICD-10-CM | POA: Diagnosis not present

## 2016-04-11 DIAGNOSIS — S8011XA Contusion of right lower leg, initial encounter: Secondary | ICD-10-CM | POA: Insufficient documentation

## 2016-04-11 DIAGNOSIS — Y9289 Other specified places as the place of occurrence of the external cause: Secondary | ICD-10-CM | POA: Diagnosis not present

## 2016-04-11 DIAGNOSIS — N200 Calculus of kidney: Secondary | ICD-10-CM | POA: Diagnosis not present

## 2016-04-11 DIAGNOSIS — Z818 Family history of other mental and behavioral disorders: Secondary | ICD-10-CM | POA: Diagnosis not present

## 2016-04-11 DIAGNOSIS — Z5181 Encounter for therapeutic drug level monitoring: Secondary | ICD-10-CM | POA: Insufficient documentation

## 2016-04-11 DIAGNOSIS — F319 Bipolar disorder, unspecified: Principal | ICD-10-CM | POA: Diagnosis present

## 2016-04-11 DIAGNOSIS — S8012XA Contusion of left lower leg, initial encounter: Secondary | ICD-10-CM | POA: Diagnosis not present

## 2016-04-11 DIAGNOSIS — F4320 Adjustment disorder, unspecified: Secondary | ICD-10-CM | POA: Diagnosis present

## 2016-04-11 DIAGNOSIS — F332 Major depressive disorder, recurrent severe without psychotic features: Secondary | ICD-10-CM | POA: Diagnosis present

## 2016-04-11 DIAGNOSIS — Y999 Unspecified external cause status: Secondary | ICD-10-CM | POA: Diagnosis not present

## 2016-04-11 DIAGNOSIS — Y939 Activity, unspecified: Secondary | ICD-10-CM | POA: Diagnosis not present

## 2016-04-11 DIAGNOSIS — G4700 Insomnia, unspecified: Secondary | ICD-10-CM | POA: Diagnosis present

## 2016-04-11 DIAGNOSIS — W109XXA Fall (on) (from) unspecified stairs and steps, initial encounter: Secondary | ICD-10-CM | POA: Diagnosis not present

## 2016-04-11 DIAGNOSIS — Z9049 Acquired absence of other specified parts of digestive tract: Secondary | ICD-10-CM

## 2016-04-11 DIAGNOSIS — K219 Gastro-esophageal reflux disease without esophagitis: Secondary | ICD-10-CM | POA: Diagnosis present

## 2016-04-11 DIAGNOSIS — F17210 Nicotine dependence, cigarettes, uncomplicated: Secondary | ICD-10-CM | POA: Insufficient documentation

## 2016-04-11 DIAGNOSIS — F1920 Other psychoactive substance dependence, uncomplicated: Secondary | ICD-10-CM

## 2016-04-11 DIAGNOSIS — S8992XA Unspecified injury of left lower leg, initial encounter: Secondary | ICD-10-CM | POA: Diagnosis present

## 2016-04-11 DIAGNOSIS — R45851 Suicidal ideations: Secondary | ICD-10-CM | POA: Diagnosis present

## 2016-04-11 DIAGNOSIS — Z800 Family history of malignant neoplasm of digestive organs: Secondary | ICD-10-CM | POA: Diagnosis not present

## 2016-04-11 DIAGNOSIS — F1290 Cannabis use, unspecified, uncomplicated: Secondary | ICD-10-CM | POA: Diagnosis not present

## 2016-04-11 DIAGNOSIS — F1219 Cannabis abuse with unspecified cannabis-induced disorder: Secondary | ICD-10-CM | POA: Diagnosis present

## 2016-04-11 DIAGNOSIS — F1910 Other psychoactive substance abuse, uncomplicated: Secondary | ICD-10-CM | POA: Insufficient documentation

## 2016-04-11 DIAGNOSIS — F1120 Opioid dependence, uncomplicated: Secondary | ICD-10-CM

## 2016-04-11 DIAGNOSIS — F1410 Cocaine abuse, uncomplicated: Secondary | ICD-10-CM | POA: Diagnosis present

## 2016-04-11 DIAGNOSIS — F1490 Cocaine use, unspecified, uncomplicated: Secondary | ICD-10-CM | POA: Diagnosis not present

## 2016-04-11 LAB — URINALYSIS, ROUTINE W REFLEX MICROSCOPIC
BACTERIA UA: NONE SEEN
Bilirubin Urine: NEGATIVE
Glucose, UA: NEGATIVE mg/dL
Ketones, ur: NEGATIVE mg/dL
Leukocytes, UA: NEGATIVE
Nitrite: NEGATIVE
PROTEIN: 30 mg/dL — AB
Specific Gravity, Urine: 1.02 (ref 1.005–1.030)
pH: 5 (ref 5.0–8.0)

## 2016-04-11 LAB — RAPID URINE DRUG SCREEN, HOSP PERFORMED
AMPHETAMINES: NOT DETECTED
BENZODIAZEPINES: POSITIVE — AB
Barbiturates: NOT DETECTED
COCAINE: POSITIVE — AB
Opiates: POSITIVE — AB
Tetrahydrocannabinol: POSITIVE — AB

## 2016-04-11 LAB — CBC WITH DIFFERENTIAL/PLATELET
BASOS ABS: 0.1 10*3/uL (ref 0.0–0.1)
BASOS PCT: 0 %
EOS ABS: 0.1 10*3/uL (ref 0.0–0.7)
Eosinophils Relative: 0 %
HEMATOCRIT: 36.5 % (ref 36.0–46.0)
HEMOGLOBIN: 12.5 g/dL (ref 12.0–15.0)
Lymphocytes Relative: 28 %
Lymphs Abs: 3.9 10*3/uL (ref 0.7–4.0)
MCH: 29.6 pg (ref 26.0–34.0)
MCHC: 34.2 g/dL (ref 30.0–36.0)
MCV: 86.5 fL (ref 78.0–100.0)
Monocytes Absolute: 1.4 10*3/uL — ABNORMAL HIGH (ref 0.1–1.0)
Monocytes Relative: 10 %
NEUTROS ABS: 8.6 10*3/uL — AB (ref 1.7–7.7)
NEUTROS PCT: 62 %
Platelets: 379 10*3/uL (ref 150–400)
RBC: 4.22 MIL/uL (ref 3.87–5.11)
RDW: 13.1 % (ref 11.5–15.5)
WBC: 13.9 10*3/uL — AB (ref 4.0–10.5)

## 2016-04-11 LAB — COMPREHENSIVE METABOLIC PANEL
ALBUMIN: 4 g/dL (ref 3.5–5.0)
ALK PHOS: 45 U/L (ref 38–126)
ALT: 24 U/L (ref 14–54)
ANION GAP: 9 (ref 5–15)
AST: 20 U/L (ref 15–41)
BILIRUBIN TOTAL: 0.4 mg/dL (ref 0.3–1.2)
BUN: 9 mg/dL (ref 6–20)
CALCIUM: 9.5 mg/dL (ref 8.9–10.3)
CO2: 25 mmol/L (ref 22–32)
CREATININE: 1.02 mg/dL — AB (ref 0.44–1.00)
Chloride: 107 mmol/L (ref 101–111)
GFR calc Af Amer: >60 mL/min (ref >60)
GFR calc non Af Amer: >60 mL/min (ref >60)
GLUCOSE: 117 mg/dL — AB (ref 65–99)
Potassium: 3.9 mmol/L (ref 3.5–5.1)
SODIUM: 141 mmol/L (ref 135–145)
Total Protein: 7.7 g/dL (ref 6.5–8.1)

## 2016-04-11 LAB — ACETAMINOPHEN LEVEL: Acetaminophen (Tylenol), Serum: <10 ug/mL — ABNORMAL LOW (ref 10–30)

## 2016-04-11 LAB — PREGNANCY, URINE: PREG TEST UR: NEGATIVE

## 2016-04-11 LAB — SALICYLATE LEVEL: SALICYLATE LVL: 7.8 mg/dL (ref 2.8–30.0)

## 2016-04-11 LAB — ETHANOL: Alcohol, Ethyl (B): <5 mg/dL (ref <5)

## 2016-04-11 MED ORDER — MAGNESIUM HYDROXIDE 400 MG/5ML PO SUSP: 30.0000 mL | Freq: Every day | ORAL | Status: DC | PRN

## 2016-04-11 MED ORDER — ACETAMINOPHEN 325 MG PO TABS
650.0000 mg | ORAL_TABLET | ORAL | Status: DC | PRN
  Administered 2016-04-11: 650 mg via ORAL
  Filled 2016-04-11: qty 2

## 2016-04-11 MED ORDER — PANTOPRAZOLE SODIUM 40 MG PO TBEC
40.0000 mg | DELAYED_RELEASE_TABLET | Freq: Every day | ORAL | Status: DC
  Administered 2016-04-11: 40 mg via ORAL
  Filled 2016-04-11: qty 1

## 2016-04-11 MED ORDER — IBUPROFEN 400 MG PO TABS
600.0000 mg | ORAL_TABLET | Freq: Three times a day (TID) | ORAL | Status: DC | PRN
  Administered 2016-04-11: 600 mg via ORAL
  Filled 2016-04-11: qty 2

## 2016-04-11 MED ORDER — FAMOTIDINE 20 MG PO TABS: 20.0000 mg | ORAL_TABLET | Freq: Every day | ORAL | Status: DC

## 2016-04-11 MED ORDER — TRAZODONE HCL 50 MG PO TABS
50.0000 mg | ORAL_TABLET | Freq: Every evening | ORAL | Status: DC | PRN
  Administered 2016-04-11: 50 mg via ORAL
  Filled 2016-04-11: qty 1

## 2016-04-11 MED ORDER — PANTOPRAZOLE SODIUM 40 MG PO TBEC
40.0000 mg | DELAYED_RELEASE_TABLET | Freq: Every day | ORAL | Status: DC
  Filled 2016-04-11 (×2): qty 1

## 2016-04-11 MED ORDER — ACETAMINOPHEN 325 MG PO TABS
650.0000 mg | ORAL_TABLET | Freq: Four times a day (QID) | ORAL | Status: DC | PRN
  Administered 2016-04-11 – 2016-04-15 (×5): 650 mg via ORAL
  Filled 2016-04-11 (×5): qty 2

## 2016-04-11 MED ORDER — ALUM & MAG HYDROXIDE-SIMETH 200-200-20 MG/5ML PO SUSP
30.0000 mL | ORAL | Status: DC | PRN
  Administered 2016-04-13: 30 mL via ORAL
  Filled 2016-04-11: qty 30

## 2016-04-11 MED ORDER — HYDROXYZINE HCL 25 MG PO TABS
25.0000 mg | ORAL_TABLET | Freq: Four times a day (QID) | ORAL | Status: DC | PRN
  Administered 2016-04-11 – 2016-04-12 (×3): 25 mg via ORAL
  Filled 2016-04-11 (×3): qty 1

## 2016-04-11 MED ORDER — ONDANSETRON HCL 4 MG PO TABS: 4.0000 mg | ORAL_TABLET | Freq: Three times a day (TID) | ORAL | Status: DC | PRN

## 2016-04-11 MED ORDER — IOPAMIDOL (ISOVUE-300) INJECTION 61%
100.0000 mL | Freq: Once | INTRAVENOUS | Status: AC | PRN
  Administered 2016-04-11: 100 mL via INTRAVENOUS

## 2016-04-11 NOTE — Tx Team (Signed)
Initial Treatment Plan 04/11/2016 4:55 PM Haley Brady [id number]    PATIENT STRESSORS: Substance abuse   PATIENT STRENGTHS: Average or above average intelligence Communication skills General fund of knowledge Motivation for treatment/growth Physical Health Supportive family/friends Work skills   PATIENT IDENTIFIED PROBLEMS: Substance abuse  depression  anxiety                 DISCHARGE CRITERIA:  Ability to meet basic life and health needs Adequate post-discharge living arrangements Improved stabilization in mood, thinking, and/or behavior Medical problems require only outpatient monitoring Motivation to continue treatment in a less acute level of care Need for constant or close observation no longer present Reduction of life-threatening or endangering symptoms to within safe limits Safe-care adequate arrangements made Verbal commitment to aftercare and medication compliance Withdrawal symptoms are absent or subacute and managed without 24-hour nursing intervention  PRELIMINARY DISCHARGE PLAN: Attend aftercare/continuing care group Attend 12-step recovery group  PATIENT/FAMILY INVOLVEMENT: This treatment plan has been presented to and reviewed with the patient, Haley Brady, and/or family member, .  The patient and family have been given the opportunity to ask questions and make suggestions.  Mosie Lukes, RN 04/11/2016, 4:55 PM

## 2016-04-11 NOTE — Progress Notes (Signed)
Pt accepted to [facility] bed 303-1. Attending Dr. Parke Poisson. Report can be called to [phone].  Sharren Bridge, MSW, LCSW Clinical Social Work, Disposition  04/11/2016 [phone]

## 2016-04-11 NOTE — ED Notes (Addendum)
[facility] called and reported pt was accepted to [facility], Bed 303-1. Number for report [phone]. Accepting physician is Dr. Parke Poisson.  [facility] transportation contacted and reported driver was returning from [place] and would call when driver gets close to [employer].  EDP and Pt aware of disposition.

## 2016-04-11 NOTE — Progress Notes (Signed)
Pt admitted to the adult unit voluntary from [facility]. Pt was staying at "a safe house" or "transitional housing" waiting to interview with [facility] in [place] on April 2nd. Pt uses IV cocaine, heroin and molly. She has track marks on each arm. Pt was positive for THC, Benzos, Cocaine, and Opiates. She became upset at the safe house due to not being able to leave, religious regiment and not being able to use the phone so she threw herself down the stairway. She reports no previous si attempts. Per report, pt tried to OD on cocaine and xanax Tuesday night. Pt reports having a miscarriage Feb 12th 2018. She has a 74 year old daughter that is staying with her grandmother. Pt reports chronic generalized pain. 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 Pt has a medical hx of anemia, arthritis, renal dis, panic dis, acid reflux and migraines. Pt reports that she is currently homeless.

## 2016-04-11 NOTE — ED Notes (Signed)
Called [facility] for transport to [facility].  Per dispatcher, "driver if coming out of [place], they will call when they are back in the area".

## 2016-04-11 NOTE — ED Provider Notes (Signed)
Pt accepted to [facility], will transfer stable.    Francine Graven, DO 04/11/16 1259
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
## 2016-04-11 NOTE — ED Provider Notes (Signed)
[facility] DEPT Provider Note   CSN: [id number] Arrival date & time: 04/11/16  0000  By signing my name below, I, Margit Banda, attest that this documentation has been prepared under the direction and in the presence of Ezequiel Essex, MD. Electronically Signed: Margit Banda, ED Scribe. 04/11/16. 1:00 AM.    History   Chief Complaint Chief Complaint  Patient presents with  . V70.1    HPI Haley Brady is a 31 y.o. female who presents to the Emergency Department complaining of SI for the last few days. Pt reports throwing herself down a flight of stairs in hopes of killing herself. No LOC or head injury. Today pt snorted cocaine. Associated sx include bilateral knee pain, bilateral leg weakness, shoulder pain, and back pain. Pt was in detox for 11 days followed by a safe house for 4 days leaving on 04/09/16. IV drug abuse (last ~ 2 weeks ago) and Xanax abuse. Pt had a miscarriage last month and a negative pregnancy test on 04/10/16. She notes she has no where to go now, since it is too much for her mother to handle. Pt denies HI, hearing voices in her head, SOB, bruising.   The history is provided by the patient and a parent. No language interpreter was used.    Past Medical History:  Diagnosis Date  . Abnormal pap   . Asymptomatic varicose veins   . Chronic back pain   . Chronic nausea   . DDD (degenerative disc disease)   . Depression   . Esophageal reflux   . Ingrowing nail   . Migraines   . Panic disorder without agoraphobia   . Renal disorder   . Vaginal Pap smear, abnormal     Patient Active Problem List   Diagnosis Date Noted  . Dyspepsia 08/26/2014  . Chronic nausea 05/28/2011  . URINALYSIS, ABNORMAL 08/06/2006  . MRSA INFECTION 05/23/2006  . OBESITY NOS 05/23/2006  . ANXIETY 05/23/2006  . MIGRAINE HEADACHE 05/23/2006  . GERD 05/23/2006  . LOW BACK PAIN, CHRONIC 05/23/2006    Past Surgical History:  Procedure Laterality Date  . BRAVO [facility] STUDY   05/2011   on nexium 40mg  daily 217 episodes of reflux, Demeester score day 1: 51.9 and Day 2: 24.8.   Marland Kitchen CHOLECYSTECTOMY    . ESOPHAGOGASTRODUODENOSCOPY  06/11/2011    SMALL HIATAL HERNIA/Moderate gastritis/  BURNING CHESTPAIN: GERD, NERD, OR NON-ULCER DYSPEPSIA/ Nl esophagus  . TONSILLECTOMY      OB History    Gravida Para Term Preterm AB Living   2 1       1    SAB TAB Ectopic Multiple Live Births                   Home Medications    Prior to Admission medications   Medication Sig Start Date End Date Taking? Authorizing Provider  omeprazole (PRILOSEC) 40 MG capsule Take 1 capsule by mouth 2 (two) times daily. 02/21/16  Yes Historical Provider, MD  oxyCODONE-acetaminophen (PERCOCET) 10-325 MG tablet Take 1 tablet by mouth every 6 (six) hours.   Yes Historical Provider, MD  ranitidine (ZANTAC) 300 MG tablet Take 300 mg by mouth at bedtime. 07/03/15  Yes Historical Provider, MD    Family History Family History  Problem Relation Age of Onset  . Pancreatic cancer Father 57    Social History Social History  Substance Use Topics  . Smoking status: Light Tobacco Smoker    Packs/day: 0.50    Types: Cigarettes  .  Smokeless tobacco: Never Used     Comment: pt reports '4 cigarettes per day."  . Alcohol use Yes     Comment: occasionally     Allergies   Patient has no known allergies.   Review of Systems Review of Systems  A complete 10 system review of systems was obtained and all systems are negative except as noted in the HPI and PMH.   Physical Exam Updated Vital Signs LMP 03/11/2016   Physical Exam  Constitutional: She is oriented to person, place, and time. She appears well-developed and well-nourished. No distress.  HENT:  Head: Normocephalic and atraumatic.  Right Ear: Hearing normal.  Left Ear: Hearing normal.  Nose: Nose normal.  Mouth/Throat: Oropharynx is clear and moist and mucous membranes are normal.  Eyes: Conjunctivae and EOM are normal. Pupils are  equal, round, and reactive to light.  Neck: Normal range of motion. Neck supple.  Cardiovascular: Regular rhythm, S1 normal and S2 normal.  Exam reveals no gallop and no friction rub.   No murmur heard. Pulmonary/Chest: Effort normal and breath sounds normal. No respiratory distress. She exhibits no tenderness.  Abdominal: Soft. Normal appearance and bowel sounds are normal. There is no hepatosplenomegaly. There is no tenderness. There is no rebound, no guarding, no tenderness at McBurney's point and negative Murphy's sign. No hernia.  Musculoskeletal: Normal range of motion.  Ecchymoses bilateral leg. Full ROM to hips and knees. Midline paraspinal tenderness 5/5 strength in bilateral lower extremities. Ankle plantar and dorsiflexion intact. Great toe extension intact bilaterally. +2 DP and PT pulses. +2 patellar reflexes bilaterally. Normal gait.   Neurological: She is alert and oriented to person, place, and time. She has normal strength. No cranial nerve deficit or sensory deficit. Coordination normal. GCS eye subscore is 4. GCS verbal subscore is 5. GCS motor subscore is 6.  Skin: Skin is warm, dry and intact. No rash noted. No cyanosis.  Psychiatric: She has a normal mood and affect. Her speech is normal and behavior is normal. Thought content normal.  Flat affect  Nursing note and vitals reviewed.    ED Treatments / Results  DIAGNOSTIC STUDIES: Oxygen Saturation is 98% on RA, normal by my interpretation.   COORDINATION OF CARE: 12:58 AM-Discussed next steps with pt. Pt verbalized understanding and is agreeable with the plan.    Labs (all labs ordered are listed, but only abnormal results are displayed) Labs Reviewed  CBC WITH DIFFERENTIAL/PLATELET - Abnormal; Notable for the following:       Result Value   WBC 13.9 (*)    Neutro Abs 8.6 (*)    Monocytes Absolute 1.4 (*)    All other components within normal limits  COMPREHENSIVE METABOLIC PANEL - Abnormal; Notable for the  following:    Glucose, Bld 117 (*)    Creatinine, Ser 1.02 (*)    All other components within normal limits  ACETAMINOPHEN LEVEL - Abnormal; Notable for the following:    Acetaminophen (Tylenol), Serum <10 (*)    All other components within normal limits  RAPID URINE DRUG SCREEN, HOSP PERFORMED - Abnormal; Notable for the following:    Opiates POSITIVE (*)    Cocaine POSITIVE (*)    Benzodiazepines POSITIVE (*)    Tetrahydrocannabinol POSITIVE (*)    All other components within normal limits  URINALYSIS, ROUTINE W REFLEX MICROSCOPIC - Abnormal; Notable for the following:    Hgb urine dipstick SMALL (*)    Protein, ur 30 (*)    All other components within  normal limits  ACETAMINOPHEN LEVEL - Abnormal; Notable for the following:    Acetaminophen (Tylenol), Serum <10 (*)    All other components within normal limits  ETHANOL  SALICYLATE LEVEL  PREGNANCY, URINE  SALICYLATE LEVEL    EKG  EKG Interpretation None       Radiology Dg Lumbar Spine Complete  Result Date: 04/11/2016 CLINICAL DATA:  [address] downstairs.  Back and knee pain. EXAM: LUMBAR SPINE - COMPLETE 4+ VIEW COMPARISON:  CT abdomen and pelvis June 29, 2014 FINDINGS: Five non rib-bearing lumbar-type vertebral bodies are intact with maintenance of the lumbar lordosis. Stable grade 1 L4-5 and L5-S1 retrolisthesis. Moderate to severe L4-5 and L5-S1 disc height loss is unchanged. No destructive bony lesions. Sacroiliac joints are symmetric. Included prevertebral and paraspinal soft tissue planes are non-suspicious. Surgical clips in the included right abdomen compatible with cholecystectomy. Phleboliths project in the pelvis. IMPRESSION: No acute fracture deformity or malalignment. Stable degenerative change of the lower lumbar spine, stable grade 1 L4-5 and L5-S1 retrolisthesis. Electronically Signed   By: Elon Alas M.D.   On: 04/11/2016 02:17   Ct Abdomen Pelvis W Contrast  Result Date: 04/11/2016 CLINICAL DATA:  [address]  down stairs.  Back pain and hematuria. EXAM: CT ABDOMEN AND PELVIS WITH CONTRAST TECHNIQUE: Multidetector CT imaging of the abdomen and pelvis was performed using the standard protocol following bolus administration of intravenous contrast. CONTRAST:  116mL ISOVUE-300 IOPAMIDOL (ISOVUE-300) INJECTION 61% COMPARISON:  06/29/2014 FINDINGS: Lower chest: Lung bases are clear. Hepatobiliary: No focal liver abnormality is seen. Status post cholecystectomy. No biliary dilatation. Pancreas: Unremarkable. No pancreatic ductal dilatation or surrounding inflammatory changes. Spleen: Normal in size without focal abnormality. Adrenals/Urinary Tract: No adrenal gland nodules. Tiny stones demonstrated in both kidneys. No hydronephrosis or hydroureter. Nephrograms are symmetrical. Bladder wall is not thickened and no filling defects are demonstrated in the bladder. Stomach/Bowel: Stomach is within normal limits. Appendix appears normal. No evidence of bowel wall thickening, distention, or inflammatory changes. Vascular/Lymphatic: No significant vascular findings are present. No enlarged abdominal or pelvic lymph nodes. Reproductive: Uterus and bilateral adnexa are unremarkable. Other: No abdominal wall hernia or abnormality. No abdominopelvic ascites. Musculoskeletal: Degenerative changes of the lumbosacral interspace. No destructive bone lesions. IMPRESSION: Multiple nonobstructing stones centrally in both kidneys. No hydronephrosis or hydroureter. No evidence of solid organ injury. Electronically Signed   By: Lucienne Capers M.D.   On: 04/11/2016 05:20   Dg Knee Complete 4 Views Left  Result Date: 04/11/2016 CLINICAL DATA:  [address] downstairs.  Back and knee pain. EXAM: LEFT KNEE - COMPLETE 4+ VIEW COMPARISON:  None. FINDINGS: No evidence of fracture, dislocation, or joint effusion. No evidence of arthropathy or other focal bone abnormality. Soft tissues are unremarkable. IMPRESSION: Negative. Electronically Signed   By:  Elon Alas M.D.   On: 04/11/2016 02:17    Procedures Procedures (including critical care time)  Medications Ordered in ED Medications - No data to display   Initial Impression / Assessment and Plan / ED Course  I have reviewed the triage vital signs and the nursing notes.  Pertinent labs & imaging results that were available during my care of the patient were reviewed by me and considered in my medical decision making (see chart for details).    Suicidal thoughts with throwing herself down stairs yesterday. Denies hitting head or losing consciousness. Complains of "pain all over". Last cocaine use was today and has also been using Xanax. Denies any recent IV drug abuse or alcohol abuse.  Labs are reassuring.  Hematuria on UA.  Patient states not on menstrual cycle.  Did have miscarriage last month.  Denies vaginal bleeding.  HCG negative today. APAP and ASA negative.  CT obtained in setting of hematuria as well as back pain and fall down stairs. This was negative for acute traumatic injury.  D/w TTS.  Patient does meet criteria for inpatient treatment.  She is voluntary at this time. Holding orders placed.   Final Clinical Impressions(s) / ED Diagnoses   Final diagnoses:  Suicidal ideation  Polysubstance abuse    New Prescriptions New Prescriptions   No medications on file   I personally performed the services described in this documentation, which was scribed in my presence. The recorded information has been reviewed and is accurate.     Ezequiel Essex, MD 04/11/16 [phone]

## 2016-04-11 NOTE — ED Triage Notes (Signed)
Pt states she is very depressed and has been thinking about Suicide, states "I threw myself down the stairs this morning"  And "tried to overdose on Cocaine and xanax on Tuesday night"    Pt is tearful and her Mother is present, states she has been an IV drug user in the past and has been to detox without success.

## 2016-04-11 NOTE — ED Notes (Signed)
Pt ambulated out of department to [facility] with steady gait and all belongings.

## 2016-04-12 DIAGNOSIS — F1920 Other psychoactive substance dependence, uncomplicated: Secondary | ICD-10-CM

## 2016-04-12 DIAGNOSIS — F1120 Opioid dependence, uncomplicated: Secondary | ICD-10-CM

## 2016-04-12 MED ORDER — PANTOPRAZOLE SODIUM 40 MG PO TBEC
40.0000 mg | DELAYED_RELEASE_TABLET | Freq: Every day | ORAL | Status: DC
  Administered 2016-04-12 – 2016-04-13 (×2): 40 mg via ORAL
  Filled 2016-04-12 (×4): qty 1

## 2016-04-12 MED ORDER — DICYCLOMINE HCL 20 MG PO TABS: 20.0000 mg | ORAL_TABLET | Freq: Four times a day (QID) | ORAL | Status: DC | PRN

## 2016-04-12 MED ORDER — VENLAFAXINE HCL ER 75 MG PO CP24
75.0000 mg | ORAL_CAPSULE | Freq: Every day | ORAL | Status: DC
  Administered 2016-04-13 – 2016-04-16 (×4): 75 mg via ORAL
  Filled 2016-04-12: qty 21
  Filled 2016-04-12 (×2): qty 1
  Filled 2016-04-12: qty 21
  Filled 2016-04-12 (×3): qty 1

## 2016-04-12 MED ORDER — CITALOPRAM HYDROBROMIDE 20 MG PO TABS
20.0000 mg | ORAL_TABLET | Freq: Every day | ORAL | Status: DC
  Administered 2016-04-12: 20 mg via ORAL
  Filled 2016-04-12 (×3): qty 1

## 2016-04-12 MED ORDER — CITALOPRAM HYDROBROMIDE 20 MG PO TABS
ORAL_TABLET | ORAL | Status: AC
  Filled 2016-04-12: qty 1

## 2016-04-12 MED ORDER — ONDANSETRON 4 MG PO TBDP: 4.0000 mg | ORAL_TABLET | Freq: Four times a day (QID) | ORAL | Status: DC | PRN

## 2016-04-12 MED ORDER — TRAZODONE HCL 100 MG PO TABS
100.0000 mg | ORAL_TABLET | Freq: Every day | ORAL | Status: DC
  Administered 2016-04-12 – 2016-04-15 (×4): 100 mg via ORAL
  Filled 2016-04-12 (×2): qty 1
  Filled 2016-04-12: qty 21
  Filled 2016-04-12 (×3): qty 1
  Filled 2016-04-12: qty 21
  Filled 2016-04-12: qty 1

## 2016-04-12 MED ORDER — HYDROXYZINE HCL 25 MG PO TABS
25.0000 mg | ORAL_TABLET | Freq: Four times a day (QID) | ORAL | Status: DC | PRN
  Administered 2016-04-12 – 2016-04-15 (×4): 25 mg via ORAL
  Filled 2016-04-12 (×4): qty 1
  Filled 2016-04-12: qty 15

## 2016-04-12 MED ORDER — METHOCARBAMOL 500 MG PO TABS
500.0000 mg | ORAL_TABLET | Freq: Three times a day (TID) | ORAL | Status: DC | PRN
  Administered 2016-04-12 – 2016-04-15 (×7): 500 mg via ORAL
  Filled 2016-04-12 (×7): qty 1

## 2016-04-12 MED ORDER — LOPERAMIDE HCL 2 MG PO CAPS
2.0000 mg | ORAL_CAPSULE | ORAL | Status: DC | PRN
  Administered 2016-04-14: 4 mg via ORAL
  Filled 2016-04-12: qty 2

## 2016-04-12 MED ORDER — CLONIDINE HCL 0.1 MG PO TABS
0.1000 mg | ORAL_TABLET | Freq: Four times a day (QID) | ORAL | Status: AC
  Administered 2016-04-12 – 2016-04-14 (×7): 0.1 mg via ORAL
  Filled 2016-04-12 (×8): qty 1

## 2016-04-12 MED ORDER — GABAPENTIN 400 MG PO CAPS
400.0000 mg | ORAL_CAPSULE | Freq: Three times a day (TID) | ORAL | Status: DC
  Administered 2016-04-12 – 2016-04-13 (×5): 400 mg via ORAL
  Filled 2016-04-12 (×9): qty 1

## 2016-04-12 MED ORDER — CLONIDINE HCL 0.1 MG PO TABS
0.1000 mg | ORAL_TABLET | ORAL | Status: AC
  Administered 2016-04-14 – 2016-04-16 (×4): 0.1 mg via ORAL
  Filled 2016-04-12 (×5): qty 1

## 2016-04-12 MED ORDER — BUPROPION HCL 100 MG PO TABS
100.0000 mg | ORAL_TABLET | Freq: Every day | ORAL | Status: DC
  Administered 2016-04-12 – 2016-04-13 (×2): 100 mg via ORAL
  Filled 2016-04-12 (×5): qty 1

## 2016-04-12 MED ORDER — CLONIDINE HCL 0.1 MG PO TABS
0.1000 mg | ORAL_TABLET | Freq: Every day | ORAL | Status: DC
  Filled 2016-04-12 (×2): qty 1

## 2016-04-12 MED ORDER — GABAPENTIN 800 MG PO TABS
400.0000 mg | ORAL_TABLET | Freq: Three times a day (TID) | ORAL | Status: DC
  Administered 2016-04-12: 400 mg via ORAL
  Filled 2016-04-12 (×5): qty 0.5
  Filled 2016-04-12: qty 1
  Filled 2016-04-12: qty 0.5

## 2016-04-12 MED ORDER — NAPROXEN 500 MG PO TABS
500.0000 mg | ORAL_TABLET | Freq: Two times a day (BID) | ORAL | Status: DC | PRN
  Administered 2016-04-14 – 2016-04-15 (×2): 500 mg via ORAL
  Filled 2016-04-12 (×2): qty 1

## 2016-04-12 NOTE — Progress Notes (Signed)
Data. Patient denies SI/HI/AVH. Patient interacting well with staff and other patients. 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 You should just let me deal with this." Patient was able to accept redirection and leave the area without incident. On her self assessment patient reports,  8/10 for anxiety and depression and 2/10 for hopelessness. Her goal for today is, "To make the pain stop." Action. Emotional support and encouragement offered. Education provided on medication, indications and side effect. Q 15 minute checks done for safety. Response. Safety on the unit maintained through 15 minute checks.  Medications taken as prescribed. Attended groups. Remained calm and appropriate through out shift.

## 2016-04-12 NOTE — H&P (Signed)
Psychiatric Admission Assessment Adult  Patient Identification: Haley Brady  MRN:  [id number]  Date of Evaluation:  04/12/2016  Chief Complaint:  MDD RECURRENT SEVERE CANNABIS ABUSE DISORDER,SEVERE COCAINE USE DISORDER,SEVERE  Principal Diagnosis: Polysubstance use disorder, including opioid drugs, Major depressive disorder, recurrent episode.  Diagnosis:   Patient Active Problem List   Diagnosis Date Noted  . Polysubstance dependence including opioid drug with daily use ([place]) [F19.20] 04/12/2016  . MDD (major depressive disorder), recurrent severe, without psychosis ([place]) [F33.2] 04/11/2016  . Dyspepsia [R10.13] 08/26/2014  . Chronic nausea [R11.0] 05/28/2011  . URINALYSIS, ABNORMAL [R82.99] 08/06/2006  . MRSA INFECTION [B95.7] 05/23/2006  . OBESITY NOS [E66.9] 05/23/2006  . ANXIETY [F41.1] 05/23/2006  . MIGRAINE HEADACHE [G43.909] 05/23/2006  . GERD [K21.9] 05/23/2006  . LOW BACK PAIN, CHRONIC [M54.5] 05/23/2006   History of Present Illness: This is an admission assessment for this 31 year old Caucasian female with hx of Bipolar disorder & polysubstance dependence. Admitted to the [facility] from the [facility] with complaints of suicidal thoughts x few days & had fallen down a flight of stairs in a suicide attempt. During this assessment, Haley Brady reports, "My mom took me to the hospital Wednesday night. I have borderline Bipolar disorder. I inherited it from my father. I also have Major Depression. I'm an addict, a functional addict. I use a lot of different drugs. I'm an IV drug user; Cocaine, Mollies, Meth & Benzodiazepines. 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 I hurt most of the time. I was diagnosed with Bipolar disorder at age 78. I have days where I sleep all the time & days that I do not sleep at all. I can never keep my thoughts straight. My mind races all the time. I had a miscarriage a month ago. That made my mood worse. I have been using drugs sincc age 65. My longest sobriety  was 4 years, I relapsed in 2013. I got very depressed this time because I thought it will be easier to not be here any more. I decided to fall down flight of stairs in a suicide attempt. This is my first & last the attempt. I would like to go to a long term substance abuse treatment center from here. I do not want to go home. On 04-29-16, a phone call assessment to get into the [facility] treatment center in [place]".  Associated Signs/Symptoms:  Depression Symptoms:  depressed mood, insomnia, psychomotor agitation, difficulty concentrating, hopelessness, anxiety,  (Hypo) Manic Symptoms:  Distractibility, Impulsivity, Irritable Mood, Labiality of Mood,  Anxiety Symptoms:  Excessive Worry,  Psychotic Symptoms:  Currently denies any hallucinations, delusional thoughts or paranoia.  PTSD Symptoms: Denies any PTSD symptoms or events.  Total Time spent with patient: 1 hour  Past Psychiatric History:   Is the patient at risk to self? No.  Has the patient been a risk to self in the past 6 months? No.  Has the patient been a risk to self within the distant past? No.  Is the patient a risk to others? No.  Has the patient been a risk to others in the past 6 months? No.  Has the patient been a risk to others within the distant past? No.   Prior Inpatient Therapy:   Prior Outpatient Therapy:    Alcohol Screening: 1. How often do you have a drink containing alcohol?: Monthly or less 2. How many drinks containing alcohol do you have on a typical day when you are drinking?: 3 or 4 3. How  often do you have six or more drinks on one occasion?: Never Preliminary Score: 1 9. Have you or someone else been injured as a result of your drinking?: No 10. Has a relative or friend or a doctor or another health worker been concerned about your drinking or suggested you cut down?: No Alcohol Use Disorder Identification Test Final Score (AUDIT): 2 Brief Intervention: AUDIT score less than 7 or  less-screening does not suggest unhealthy drinking-brief intervention not indicated  Substance Abuse History in the last 12 months:  Yes.    Consequences of Substance Abuse: Medical Consequences:  Liver damage, Possible death by overdose Legal Consequences:  Arrests, jail time, Loss of driving privilege. Family Consequences:  Family discord, divorce and or separation.  Previous Psychotropic Medications: Yes (Elavil).  Psychological Evaluations: Yes   Past Medical History:  Past Medical History:  Diagnosis Date  . Abnormal pap   . Asymptomatic varicose veins   . Chronic back pain   . Chronic nausea   . DDD (degenerative disc disease)   . Depression   . Esophageal reflux   . Ingrowing nail   . Migraines   . Panic disorder without agoraphobia   . Renal disorder   . Vaginal Pap smear, abnormal     Past Surgical History:  Procedure Laterality Date  . BRAVO [place] STUDY  05/2011   on nexium '40mg'$  daily 217 episodes of reflux, [place]ter score day 1: 51.9 and Day 2: 24.8.   [facility] CHOLECYSTECTOMY    . ESOPHAGOGASTRODUODENOSCOPY  06/11/2011    SMALL HIATAL HERNIA/Moderate gastritis/  BURNING CHESTPAIN: GERD, NERD, OR NON-ULCER DYSPEPSIA/ Nl esophagus  . TONSILLECTOMY     Family History:  Family History  Problem Relation Age of Onset  . Pancreatic cancer Father 89   Family Psychiatric  History: Bipolar disorder: Father  Tobacco Screening: Have you used any form of tobacco in the last 30 days? (Cigarettes, Smokeless Tobacco, Cigars, and/or Pipes): Yes Tobacco use, Select all that apply: 4 or less cigarettes per day Are you interested in Tobacco Cessation Medications?: No, patient refused Counseled patient on smoking cessation including recognizing danger situations, developing coping skills and basic information about quitting provided: Refused/Declined practical counseling  Social History:  History  Alcohol Use  . Yes    Comment: occasionally     History  Drug Use  . Types:  Cocaine, Marijuana, Methamphetamines    Additional Social History:  Allergies:  No Known Allergies Lab Results:  Results for orders placed or performed during the hospital encounter of 04/11/16 (from the past 48 hour(s))  CBC with Differential     Status: Abnormal   Collection Time: 04/11/16 12:58 AM  Result Value Ref Range   WBC 13.9 (H) 4.0 - 10.5 K/uL   RBC 4.22 3.87 - 5.11 MIL/uL   Hemoglobin 12.5 12.0 - 15.0 g/dL   HCT 36.5 36.0 - 46.0 %   MCV 86.5 78.0 - 100.0 fL   MCH 29.6 26.0 - 34.0 pg   MCHC 34.2 30.0 - 36.0 g/dL   RDW 13.1 11.5 - 15.5 %   Platelets 379 150 - 400 K/uL   Neutrophils Relative % 62 %   Neutro Abs 8.6 (H) 1.7 - 7.7 K/uL   Lymphocytes Relative 28 %   Lymphs Abs 3.9 0.7 - 4.0 K/uL   Monocytes Relative 10 %   Monocytes Absolute 1.4 (H) 0.1 - 1.0 K/uL   Eosinophils Relative 0 %   Eosinophils Absolute 0.1 0.0 - 0.7 K/uL  Basophils Relative 0 %   Basophils Absolute 0.1 0.0 - 0.1 K/uL  Comprehensive metabolic panel     Status: Abnormal   Collection Time: 04/11/16 12:58 AM  Result Value Ref Range   Sodium 141 135 - 145 mmol/L   Potassium 3.9 3.5 - 5.1 mmol/L   Chloride 107 101 - 111 mmol/L   CO2 25 22 - 32 mmol/L   Glucose, Bld 117 (H) 65 - 99 mg/dL   BUN 9 6 - 20 mg/dL   Creatinine, Ser 4.97 (H) 0.44 - 1.00 mg/dL   Calcium 9.5 8.9 - 22.2 mg/dL   Total Protein 7.7 6.5 - 8.1 g/dL   Albumin 4.0 3.5 - 5.0 g/dL   AST 20 15 - 41 U/L   ALT 24 14 - 54 U/L   Alkaline Phosphatase 45 38 - 126 U/L   Total Bilirubin 0.4 0.3 - 1.2 mg/dL   GFR calc non Af Amer >60 >60 mL/min   GFR calc Af Amer >60 >60 mL/min    Comment: (NOTE) The eGFR has been calculated using the CKD EPI equation. This calculation has not been validated in all clinical situations. eGFR's persistently <60 mL/min signify possible Chronic Kidney Disease.    Anion gap 9 5 - 15  Ethanol     Status: None   Collection Time: 04/11/16 12:58 AM  Result Value Ref Range   Alcohol, Ethyl (B) <5 <5  mg/dL    Comment:        LOWEST DETECTABLE LIMIT FOR SERUM ALCOHOL IS 5 mg/dL FOR MEDICAL PURPOSES ONLY   Acetaminophen level     Status: Abnormal   Collection Time: 04/11/16 12:58 AM  Result Value Ref Range   Acetaminophen (Tylenol), Serum <10 (L) 10 - 30 ug/mL    Comment:        THERAPEUTIC CONCENTRATIONS VARY SIGNIFICANTLY. A RANGE OF 10-30 ug/mL MAY BE AN EFFECTIVE CONCENTRATION FOR MANY PATIENTS. HOWEVER, SOME ARE BEST TREATED AT CONCENTRATIONS OUTSIDE THIS RANGE. ACETAMINOPHEN CONCENTRATIONS >150 ug/mL AT 4 HOURS AFTER INGESTION AND >50 ug/mL AT 12 HOURS AFTER INGESTION ARE OFTEN ASSOCIATED WITH TOXIC REACTIONS.   Salicylate level     Status: None   Collection Time: 04/11/16 12:58 AM  Result Value Ref Range   Salicylate Lvl 7.8 2.8 - 30.0 mg/dL  Rapid urine drug screen (hospital performed)     Status: Abnormal   Collection Time: 04/11/16  1:05 AM  Result Value Ref Range   Opiates POSITIVE (A) NONE DETECTED   Cocaine POSITIVE (A) NONE DETECTED   Benzodiazepines POSITIVE (A) NONE DETECTED   Amphetamines NONE DETECTED NONE DETECTED   Tetrahydrocannabinol POSITIVE (A) NONE DETECTED   Barbiturates NONE DETECTED NONE DETECTED    Comment:        DRUG SCREEN FOR MEDICAL PURPOSES ONLY.  IF CONFIRMATION IS NEEDED FOR ANY PURPOSE, NOTIFY LAB WITHIN 5 DAYS.        LOWEST DETECTABLE LIMITS FOR URINE DRUG SCREEN Drug Class       Cutoff (ng/mL) Amphetamine      1000 Barbiturate      200 Benzodiazepine   200 Tricyclics       300 Opiates          300 Cocaine          300 THC              50   Urinalysis, Routine w reflex microscopic     Status: Abnormal   Collection Time: 04/11/16  1:05 AM  Result Value Ref Range   Color, Urine YELLOW YELLOW   APPearance CLEAR CLEAR   Specific Gravity, Urine 1.020 1.005 - 1.030   pH 5.0 5.0 - 8.0   Glucose, UA NEGATIVE NEGATIVE mg/dL   Hgb urine dipstick SMALL (A) NEGATIVE   Bilirubin Urine NEGATIVE NEGATIVE   Ketones, ur  NEGATIVE NEGATIVE mg/dL   Protein, ur 30 (A) NEGATIVE mg/dL   Nitrite NEGATIVE NEGATIVE   Leukocytes, UA NEGATIVE NEGATIVE   RBC / HPF TOO NUMEROUS TO COUNT 0 - 5 RBC/hpf   WBC, UA 0-5 0 - 5 WBC/hpf   Bacteria, UA NONE SEEN NONE SEEN   Mucous PRESENT   Pregnancy, urine     Status: None   Collection Time: 04/11/16  1:05 AM  Result Value Ref Range   Preg Test, Ur NEGATIVE NEGATIVE  Acetaminophen level     Status: Abnormal   Collection Time: 04/11/16  3:38 AM  Result Value Ref Range   Acetaminophen (Tylenol), Serum <10 (L) 10 - 30 ug/mL    Comment:        THERAPEUTIC CONCENTRATIONS VARY SIGNIFICANTLY. A RANGE OF 10-30 ug/mL MAY BE AN EFFECTIVE CONCENTRATION FOR MANY PATIENTS. HOWEVER, SOME ARE BEST TREATED AT CONCENTRATIONS OUTSIDE THIS RANGE. ACETAMINOPHEN CONCENTRATIONS >150 ug/mL AT 4 HOURS AFTER INGESTION AND >50 ug/mL AT 12 HOURS AFTER INGESTION ARE OFTEN ASSOCIATED WITH TOXIC REACTIONS.   Salicylate level     Status: None   Collection Time: 04/11/16  3:38 AM  Result Value Ref Range   Salicylate Lvl <3.3 2.8 - 30.0 mg/dL   Blood Alcohol level:  Lab Results  Component Value Date   ETH <5 29/51/8841   Metabolic Disorder Labs:  No results found for: HGBA1C, MPG No results found for: PROLACTIN No results found for: CHOL, TRIG, HDL, CHOLHDL, VLDL, LDLCALC  Current Medications: Current Facility-Administered Medications  Medication Dose Route Frequency Provider Last Rate Last Dose  . acetaminophen (TYLENOL) tablet 650 mg  650 mg Oral Q6H PRN Ethelene Hal, NP   650 mg at 04/12/16 1148  . alum & mag hydroxide-simeth (MAALOX/MYLANTA) 200-200-20 MG/5ML suspension 30 mL  30 mL Oral Q4H PRN Ethelene Hal, NP      . citalopram (CELEXA) 20 MG tablet           . citalopram (CELEXA) tablet 20 mg  20 mg Oral Daily [name], MD   20 mg at 04/12/16 1145  . cloNIDine (CATAPRES) tablet 0.1 mg  0.1 mg Oral QID Encarnacion Slates, NP       Followed by  . [START ON  04/14/2016] cloNIDine (CATAPRES) tablet 0.1 mg  0.1 mg Oral BH-qamhs Encarnacion Slates, NP       Followed by  . [START ON 04/17/2016] cloNIDine (CATAPRES) tablet 0.1 mg  0.1 mg Oral QAC breakfast Encarnacion Slates, NP      . dicyclomine (BENTYL) tablet 20 mg  20 mg Oral Q6H PRN Encarnacion Slates, NP      . gabapentin (NEURONTIN) capsule 400 mg  400 mg Oral TID Jenne Campus, MD      . hydrOXYzine (ATARAX/VISTARIL) tablet 25 mg  25 mg Oral Q6H PRN Encarnacion Slates, NP      . loperamide (IMODIUM) capsule 2-4 mg  2-4 mg Oral PRN Encarnacion Slates, NP      . magnesium hydroxide (MILK OF MAGNESIA) suspension 30 mL  30 mL Oral Daily PRN Ethelene Hal, NP      .  methocarbamol (ROBAXIN) tablet 500 mg  500 mg Oral Q8H PRN Encarnacion Slates, NP      . naproxen (NAPROSYN) tablet 500 mg  500 mg Oral BID PRN Encarnacion Slates, NP      . ondansetron (ZOFRAN-ODT) disintegrating tablet 4 mg  4 mg Oral Q6H PRN Encarnacion Slates, NP      . pantoprazole (PROTONIX) EC tablet 40 mg  40 mg Oral Daily [name], NP   40 mg at 04/12/16 1610  . traZODone (DESYREL) tablet 100 mg  100 mg Oral QHS [name], MD       PTA Medications: Prescriptions Prior to Admission  Medication Sig Dispense Refill Last Dose  . omeprazole (PRILOSEC) 40 MG capsule Take 1 capsule by mouth 2 (two) times daily.   04/10/2016 at Unknown time  . oxyCODONE-acetaminophen (PERCOCET) 10-325 MG tablet Take 1 tablet by mouth every 4 (four) hours as needed for pain.    04/10/2016 at Unknown time  . ranitidine (ZANTAC) 300 MG tablet Take 300 mg by mouth at bedtime.   04/10/2016 at Unknown time   Musculoskeletal: Strength & Muscle Tone: within normal limits Gait & Station: normal Patient leans: N/A  Psychiatric Specialty Exam: Physical Exam  Constitutional: She appears well-developed.  HENT:  Head: Normocephalic.  Eyes: Pupils are equal, round, and reactive to light.  Neck: Normal range of motion.  Cardiovascular: Normal rate.   Respiratory: Effort normal.   GI: Soft.  Genitourinary:  Genitourinary Comments: Denies any issues in this area  Musculoskeletal: Normal range of motion.  Neurological: She is alert.  Skin: Skin is warm.    Review of Systems  Constitutional: Positive for malaise/fatigue.  HENT: Negative.   Eyes: Negative.   Respiratory: Negative.   Cardiovascular: Negative.   Gastrointestinal: Negative.   Genitourinary: Negative.   Musculoskeletal: Negative.   Skin: Negative.   Neurological: Negative.   Endo/Heme/Allergies: Negative.   Psychiatric/Behavioral: Positive for depression, hallucinations, substance abuse and suicidal ideas. Negative for memory loss. The patient is nervous/anxious and has insomnia.     Blood pressure (!) 147/84, pulse 86, temperature 98 F (36.7 C), temperature source Oral, resp. rate 18, height 5' 3.5" (1.613 m), weight 108.7 kg (239 lb 12 oz), last menstrual period 03/11/2016.Body mass index is 41.8 kg/m.  General Appearance: Casual and Fairly Groomed, Obese  Eye Contact:  Fair  Speech:  Clear and Coherent and Normal Rate  Volume:  Normal  Mood:  Anxious, Depressed and tearful  Affect:  Tearful  Thought Process:  Coherent and Goal Directed  Orientation:  Full (Time, Place, and Person)  Thought Content:  Currently denies any hallucinations, delusional thoughts or paranoia.  Suicidal Thoughts:  Denies any thoughts, plans or intent.  Homicidal Thoughts:  Currently denies any thoughts, plans or intent  Memory:  Immediate;   Good Recent;   Good Remote;   Good  Judgement:  Fair  Insight:  Present  Psychomotor Activity:  Normal  Concentration:  Concentration: Fair and Attention Span: Fair  Recall:  Good  Fund of Knowledge:  Good  Language:  Good  Akathisia:  Negative  Handed:  Right  AIMS (if indicated):     Assets:  Communication Skills Desire for Improvement Social Support  ADL's:  Intact  Cognition:  WNL  Sleep:  Number of Hours: 6   Treatment Plan/Recommendations: 1. Admit for  crisis management and stabilization, estimated length of stay 3-5 days.   2. Medication management to reduce current symptoms to base  line and improve the patient's overall level of functioning: See MD's SRA & plan care for the patient.   3. Treat health problems as indicated.  4. Develop treatment plan to decrease risk of relapse upon discharge and the need for readmission.  5. Psycho-social education regarding relapse prevention and self care.  6. Health care follow up as needed for medical problems.  7. Review, reconcile, and reinstate any pertinent home medications for other health issues where appropriate. 8. Call for consults with hospitalist for any additional specialty patient care services as needed.  Observation Level/Precautions:  15 minute checks  Laboratory:  Per ED, UDS + for Benzodiazepine, Opioid, Cocaine & THC  Psychotherapy: Group sessions  Medications: See MAR   Consultations: As needed  Discharge Concerns: Safety, maintaining  sobriety  Estimated LOS: 3-4 days  Other: Admit to the [facility].   Physician Treatment Plan for Primary Diagnosis: Will initiate medication management for mood stability. Set up an outpatient psychiatric services for medication management. Will encourage medication adherence with psychiatric medications.  Long Term Goal(s): Improvement in symptoms so as ready for discharge  Short Term Goals: Ability to identify changes in lifestyle to reduce recurrence of condition will improve, Ability to verbalize feelings will improve, Ability to disclose and discuss suicidal ideas and Ability to demonstrate self-control will improve  Physician Treatment Plan for Secondary Diagnosis: Principal Problem:   Polysubstance dependence including opioid drug with daily use ([place]) Active Problems:   MDD (major depressive disorder), recurrent severe, without psychosis ([place])  Long Term Goal(s): Improvement in symptoms so as ready for discharge  Short Term Goals:  Ability to demonstrate self-control will improve, Ability to identify and develop effective coping behaviors will improve, Compliance with prescribed medications will improve and Ability to identify triggers associated with substance abuse/mental health issues will improve  I certify that inpatient services furnished can reasonably be expected to improve the patient's condition.    Encarnacion Slates, NP, PMHNP, FNP-BC 3/16/201812:58 PM

## 2016-04-12 NOTE — Progress Notes (Signed)
D.  Pt pleasant on approach, denied complaints at this time.  Pt was positive for evening AA group, observed interacting appropriately with peers on the unit.  Pt can be intrusive at times with peers as she works as a Marine scientist outside of the hospital and wants to help.  Pt does respond to redirection.  Pt denies SI/HI/hallucinations at this time.  A.  Support and encouragement offered, medication given as ordered.  R.  Pt remains safe on the unit, will continue to monitor.

## 2016-04-12 NOTE — Tx Team (Signed)
Interdisciplinary Treatment and Diagnostic Plan Update  04/12/2016 Time of Session: 0930 INOLA Haley Brady MRN: [id number]  Principal Diagnosis: Polysubstance dependence including opioid drug with daily use (Haley Brady)  Secondary Diagnoses: Principal Problem:   Polysubstance dependence including opioid drug with daily use (Haley Brady) Active Problems:   MDD (major depressive disorder), recurrent severe, without psychosis (Haley Brady)   Current Medications:  Current Facility-Administered Medications  Medication Dose Route Frequency Provider Last Rate Last Dose  . acetaminophen (TYLENOL) tablet 650 mg  650 mg Oral Q6H PRN Ethelene Hal, NP   650 mg at 04/12/16 1148  . alum & mag hydroxide-simeth (MAALOX/MYLANTA) 200-200-20 MG/5ML suspension 30 mL  30 mL Oral Q4H PRN Ethelene Hal, NP      . citalopram (CELEXA) 20 MG tablet           . citalopram (CELEXA) tablet 20 mg  20 mg Oral Daily Haley Beach, MD   20 mg at 04/12/16 1145  . cloNIDine (CATAPRES) tablet 0.1 mg  0.1 mg Oral QID Encarnacion Slates, NP       Followed by  . [START ON 04/14/2016] cloNIDine (CATAPRES) tablet 0.1 mg  0.1 mg Oral BH-qamhs Encarnacion Slates, NP       Followed by  . [START ON 04/17/2016] cloNIDine (CATAPRES) tablet 0.1 mg  0.1 mg Oral QAC breakfast Encarnacion Slates, NP      . dicyclomine (BENTYL) tablet 20 mg  20 mg Oral Q6H PRN Encarnacion Slates, NP      . gabapentin (NEURONTIN) capsule 400 mg  400 mg Oral TID Jenne Campus, MD      . hydrOXYzine (ATARAX/VISTARIL) tablet 25 mg  25 mg Oral Q6H PRN Encarnacion Slates, NP      . loperamide (IMODIUM) capsule 2-4 mg  2-4 mg Oral PRN Encarnacion Slates, NP      . magnesium hydroxide (MILK OF MAGNESIA) suspension 30 mL  30 mL Oral Daily PRN Ethelene Hal, NP      . methocarbamol (ROBAXIN) tablet 500 mg  500 mg Oral Q8H PRN Encarnacion Slates, NP      . naproxen (NAPROSYN) tablet 500 mg  500 mg Oral BID PRN Encarnacion Slates, NP      . ondansetron (ZOFRAN-ODT) disintegrating tablet 4 mg  4 mg  Oral Q6H PRN Encarnacion Slates, NP      . pantoprazole (PROTONIX) EC tablet 40 mg  40 mg Oral Daily Haley Nunnery, NP   40 mg at 04/12/16 1884  . traZODone (DESYREL) tablet 100 mg  100 mg Oral QHS Haley Beach, MD       PTA Medications: Prescriptions Prior to Admission  Medication Sig Dispense Refill Last Dose  . omeprazole (PRILOSEC) 40 MG capsule Take 1 capsule by mouth 2 (two) times daily.   04/10/2016 at Unknown time  . oxyCODONE-acetaminophen (PERCOCET) 10-325 MG tablet Take 1 tablet by mouth every 4 (four) hours as needed for pain.    04/10/2016 at Unknown time  . ranitidine (ZANTAC) 300 MG tablet Take 300 mg by mouth at bedtime.   04/10/2016 at Unknown time    Patient Stressors: Substance abuse  Patient Strengths: Average or above average intelligence Communication skills General fund of knowledge Motivation for treatment/growth Physical Health Supportive family/friends Work skills  Treatment Modalities: Medication Management, Group therapy, Case management,  1 to 1 session with clinician, Psychoeducation, Recreational therapy.   Physician Treatment Plan for Primary Diagnosis: Polysubstance dependence including opioid  drug with daily use ([place]) Long Term Goal(s): Improvement in symptoms so as ready for discharge Improvement in symptoms so as ready for discharge   Short Term Goals: Ability to identify changes in lifestyle to reduce recurrence of condition will improve Ability to verbalize feelings will improve Ability to disclose and discuss suicidal ideas Ability to demonstrate self-control will improve Ability to demonstrate self-control will improve Ability to identify and develop effective coping behaviors will improve Compliance with prescribed medications will improve Ability to identify triggers associated with substance abuse/mental health issues will improve  Medication Management: Evaluate patient's response, side effects, and tolerance of medication  regimen.  Therapeutic Interventions: 1 to 1 sessions, Unit Group sessions and Medication administration.  Evaluation of Outcomes: Not Met  Physician Treatment Plan for Secondary Diagnosis: Principal Problem:   Polysubstance dependence including opioid drug with daily use (Haley Brady) Active Problems:   MDD (major depressive disorder), recurrent severe, without psychosis (Haley Brady)  Long Term Goal(s): Improvement in symptoms so as ready for discharge Improvement in symptoms so as ready for discharge   Short Term Goals: Ability to identify changes in lifestyle to reduce recurrence of condition will improve Ability to verbalize feelings will improve Ability to disclose and discuss suicidal ideas Ability to demonstrate self-control will improve Ability to demonstrate self-control will improve Ability to identify and develop effective coping behaviors will improve Compliance with prescribed medications will improve Ability to identify triggers associated with substance abuse/mental health issues will improve     Medication Management: Evaluate patient's response, side effects, and tolerance of medication regimen.  Therapeutic Interventions: 1 to 1 sessions, Unit Group sessions and Medication administration.  Evaluation of Outcomes: Not Met   RN Treatment Plan for Primary Diagnosis: Polysubstance dependence including opioid drug with daily use (Haley Brady) Long Term Goal(s): Knowledge of disease and therapeutic regimen to maintain health will improve  Short Term Goals: Ability to remain free from injury will improve, Ability to verbalize feelings will improve and Ability to disclose and discuss suicidal ideas  Medication Management: RN will administer medications as ordered by provider, will assess and evaluate patient's response and provide education to patient for prescribed medication. RN will report any adverse and/or side effects to prescribing provider.  Therapeutic Interventions: 1 on 1 counseling  sessions, Psychoeducation, Medication administration, Evaluate responses to treatment, Monitor vital signs and CBGs as ordered, Perform/monitor CIWA, COWS, AIMS and Fall Risk screenings as ordered, Perform wound care treatments as ordered.  Evaluation of Outcomes: Not Met   LCSW Treatment Plan for Primary Diagnosis: Polysubstance dependence including opioid drug with daily use ([place]) Long Term Goal(s): Safe transition to appropriate next level of care at discharge, Engage patient in therapeutic group addressing interpersonal concerns.  Short Term Goals: Engage patient in aftercare planning with referrals and resources, Facilitate patient progression through stages of change regarding substance use diagnoses and concerns and Identify triggers associated with mental health/substance abuse issues  Therapeutic Interventions: Assess for all discharge needs, 1 to 1 time with Social worker, Explore available resources and support systems, Assess for adequacy in community support network, Educate family and significant other(s) on suicide prevention, Complete Psychosocial Assessment, Interpersonal group therapy.  Evaluation of Outcomes: Not Met   Progress in Treatment: Attending groups: No. New to unit. Continuing to assess.  Participating in groups: No. Taking medication as prescribed: Yes. Toleration medication: Yes. Family/Significant other contact made: No, will contact:  family member if patient consents. Patient understands diagnosis: Yes. Discussing patient identified problems/goals with staff: Yes. Medical problems stabilized  or resolved: Yes. Denies suicidal/homicidal ideation: No.Passive SI/Able to contract for safety on the unit.  Issues/concerns per patient self-inventory: No. Other: n/a   New problem(s) identified: No, Describe:  n/a   New Short Term/Long Term Goal(s): detox; medication management; mood stabilization; development of comprehensive mental wellness/sobriety plan.    Discharge Plan or Barriers: CSW assessing for appropriate referrals. Pt was at [facility] prior to admission and is waiting for [facility] with [facility] in [place] (early April).   Reason for Continuation of Hospitalization: Anxiety Depression Medication stabilization Suicidal ideation Withdrawal symptoms  Estimated Length of Stay: 3-5 days   Attendees: Patient: 04/12/2016 3:10 PM  Physician: Dr. Sanjuana Letters MD 04/12/2016 3:10 PM  Nursing: Resa Miner RN 04/12/2016 3:10 PM  RN Care Manager: Lars Pinks CM 04/12/2016 3:10 PM  Social Worker: Press photographer, LCSW; Matthew Saras [place]  04/12/2016 3:10 PM  Recreational Therapist: Rhunette Croft 04/12/2016 3:10 PM  Other: Samuel Jester NP; Lindell Spar NP 04/12/2016 3:10 PM  Other:  04/12/2016 3:10 PM  Other: 04/12/2016 3:10 PM    Scribe for Treatment Team: Kimber Relic Smart, LCSW 04/12/2016 3:10 PM
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
## 2016-04-12 NOTE — Progress Notes (Signed)
Recreation Therapy Notes  Date: 04/12/16 Time: 0930 Location: 300 Hall Dayroom  Group Topic: Stress Management  Goal Area(s) Addresses:  Patient will verbalize importance of using healthy stress management.  Patient will identify positive emotions associated with healthy stress management.   Intervention: Stress Management  Activity :  Meditation.  LRT introduced the stress management technique of meditation to patients.  LRT played a meditation from the Calm app to allow patients to engage in the technique.  Patients were to follow along as the meditation played to engage.      Education:  Stress Management, Discharge Planning.   Education Outcome: Acknowledges edcuation/In group clarification offered/Needs additional education  Clinical Observations/Feedback: Pt did not attend group.   [name], LRT/CTRS         [name] A 04/12/2016 12:21 PM

## 2016-04-12 NOTE — Progress Notes (Signed)
CSW left message with [facility] director Levada Dy per pt request. Pt states that she is on waitlist there. CSW also faxed referral to [facility] with patient consents. She has phone interview with [facility] in [place], [place] in early April and was informed that she could not stay in the hospital for disposition only and needed to work with [facility] on this. Pt verbalized understanding of above and has been open to exploring [facility] and [facility].  Maxie Better, MSW, LCSW Clinical Social Worker 04/12/2016 3:35 PM

## 2016-04-12 NOTE — Progress Notes (Signed)
D: Pt at the time of assessment was alert and oriented x4. Pt at the time endorsed moderate anxiety, depression and generalized body pain. Pt could also be med seeking; states, "I don't know why they are not giving me my Percocet; I have been taking Percocet for several years now." Pt however denied HI or AVH. Pt was a little restless when it was time to go to bed. A: Medications offered as prescribed. All patient's questions and concerns addressed. Support, encouragement, and safe environment provided. 15-minute safety checks continue. R: Pt was med compliant.  Pt attended wrap-up group. Safety checks continue.

## 2016-04-12 NOTE — BHH Suicide Risk Assessment (Signed)
[facility] Admission Suicide Risk Assessment   Nursing information obtained from:  Patient Demographic factors:  Caucasian Current Mental Status:  Self-harm behaviors Loss Factors:  NA Historical Factors:  Family history of mental illness or substance abuse Risk Reduction Factors:  Employed, Positive therapeutic relationship  Total Time spent with patient: 30 minutes Principal Problem: MDD Recurrent                                  SUD Diagnosis:   Patient Active Problem List   Diagnosis Date Noted  . MDD (major depressive disorder), recurrent severe, without psychosis ([place]) [F33.2] 04/11/2016  . Dyspepsia [R10.13] 08/26/2014  . Chronic nausea [R11.0] 05/28/2011  . URINALYSIS, ABNORMAL [R82.99] 08/06/2006  . MRSA INFECTION [B95.7] 05/23/2006  . OBESITY NOS [E66.9] 05/23/2006  . ANXIETY [F41.1] 05/23/2006  . MIGRAINE HEADACHE [G43.909] 05/23/2006  . GERD [K21.9] 05/23/2006  . LOW BACK PAIN, CHRONIC [M54.5] 05/23/2006   Subjective Data:  31 yo Caucasian female, single, in transitional housing. Waiting to get into rehab next month. Background history of IVDU. Injects cocaine and heroine. UDS was positive for Benzodiazepines, opiates cocaine and  THC.  Patient was evaluated at the ER two days ago for vaginal bleeding. She was eight weeks pregnant. Patient suffered a miscarriage. She was very distraught at the transitional house. Patient threw herself down the stairs. She was reported to have expressed suicidal thoughts. She attempted to overdose on cocaine and xanax. At interview, she reports long history of substance use. Reports depression. Says she responded well to Citalopram. Patient wants to get back on it. She could not settle into sleep last night. She required Trazodone. States that it was helpful. She is still hurting emotionally. No current suicidal thoughts. No thoughts of violence. No hallucination in any modality. No delusional theme. No passivity of will. She is able to think  clearly. Her thoughts are not crowded. Says she was at groups this morning. She was able to focus at group. She is focused on getting pain medication as she reports being on percocet for years. We discussed safer ways of managing pain. Patient had benefit from Gabapentin in the past. No past history of suicidal behavior. No past history of violent behavior. No access to weapons.  No other stressors at this time. No pending legal issues. Has a 36 year old daughter. Has access to her daughter. Very good support from her mom.   Continued Clinical Symptoms:  Alcohol Use Disorder Identification Test Final Score (AUDIT): 2 The "Alcohol Use Disorders Identification Test", Guidelines for Use in Primary Care, Second Edition.  World Pharmacologist [facility]). Score between 0-7:  no or low risk or alcohol related problems. Score between 8-15:  moderate risk of alcohol related problems. Score between 16-19:  high risk of alcohol related problems. Score 20 or above:  warrants further diagnostic evaluation for alcohol dependence and treatment.   CLINICAL FACTORS:  Depression Bereavement Substance use   Musculoskeletal: Strength & Muscle Tone: within normal limits Gait & Station: normal Patient leans: N/A  Psychiatric Specialty Exam: Physical Exam  Constitutional: She is oriented to person, place, and time. She appears well-developed and well-nourished.  HENT:  Head: Normocephalic and atraumatic.  Eyes: Conjunctivae and EOM are normal. Pupils are equal, round, and reactive to light.  Neck: Normal range of motion. Neck supple.  Cardiovascular: Normal rate, regular rhythm and normal heart sounds.   Respiratory: Effort normal and breath sounds  normal.  GI: Soft. Bowel sounds are normal.  Musculoskeletal: Normal range of motion.  Neurological: She is alert and oriented to person, place, and time.  Skin: Skin is warm and dry.  Psychiatric:  As above    ROS  Blood pressure 111/70, pulse (!)  101, temperature 98 F (36.7 C), temperature source Oral, resp. rate 18, height 5' 3.5" (1.613 m), weight 108.7 kg (239 lb 12 oz), last menstrual period 03/11/2016.Body mass index is 41.8 kg/m.  General Appearance: Overweight, was at group prior to interview. Calm and cooperative. Not in any distress. Good eye contact. Good rapport. No evidence of withdrawals clinically. Not internally stimulated.   Eye Contact:  Good  Speech:  Clear and Coherent  Volume:  Normal  Mood:  Depressed  Affect:  Restricted but appropriate  Thought Process:  Goal Directed and Linear  Orientation:  Full (Time, Place, and Person)  Thought Content:  Rumination about her loss. No thoughts of violence. No delusional theme. No hallucination in any modality.   Suicidal Thoughts:  No  Homicidal Thoughts:  No  Memory:  Immediate;   Good Recent;   Good Remote;   Good  Judgement:  Fair  Insight:  Good  Psychomotor Activity:  Normal  Concentration:  Concentration: Good and Attention Span: Good  Recall:  Good  Fund of Knowledge:  Good  Language:  Good  Akathisia:  No  Handed:    AIMS (if indicated):     Assets:  Communication Skills Desire for Improvement Financial Resources/Insurance Housing Physical Health Resilience Social Support Vocational/Educational  ADL's:  Intact  Cognition:  WNL  Sleep:  Number of Hours: 6      COGNITIVE FEATURES THAT CONTRIBUTE TO RISK:  None    SUICIDE RISK:   Moderate:  Frequent suicidal ideation with limited intensity, and duration, some specificity in terms of plans, no associated intent, good self-control, limited dysphoria/symptomatology, some risk factors present, and identifiable protective factors, including available and accessible social support.  PLAN OF CARE:  Patient is presenting in context of recent loss. She did not cope well as she relapsed with substances. Patient is dysphoric and vulnerable to act impulsively. We discussed plan as listed below. We reviewed  the risks and benefits of each medication respectively. Patient consented to treatment respectively.   Psychiatric: MDD recurrent Adjustment disorder SUD Bereavement   Medical: Recent spontaneous abortion  Psychosocial:    PLAN: 1. Citalopram 20 mg daily. Would titrate gradually 2. Gabapentin 400 mg TID 3. Trazodone 100 mg HS 4. Encourage unit groups and activities 5. Monitor mood, behavior and interaction with peers 6. Motivational enhancement  7. SW would help facilitate aftercare.    I certify that inpatient services furnished can reasonably be expected to improve the patient's condition.   [name], MD 04/12/2016, 10:51 AM

## 2016-04-12 NOTE — BHH Group Notes (Signed)
[place] LCSW Group Therapy  04/12/2016 10:36 AM  Type of Therapy:  Group Therapy  Participation Level:  Active  Participation Quality:  Attentive  Affect:  Appropriate  Cognitive:  Oriented  Insight:  Improving  Engagement in Therapy:  Improving  Modes of Intervention:  Confrontation, Discussion, Education, Problem-solving, Socialization and Support  Summary of Progress/Problems: Emotion Regulation: This group focused on both positive and negative emotion identification and allowed group members to process ways to identify feelings, regulate negative emotions, and find healthy ways to manage internal/external emotions. Group members were asked to reflect on a time when their reaction to an emotion led to a negative outcome and explored how alternative responses using emotion regulation would have benefited them. Group members were also asked to discuss a time when emotion regulation was utilized when a negative emotion was experienced. Clarity was attentive and engaged during today's processing group. She shared that she struggles with depression and was able to identify the physical and mental symptoms associated with depression. Atarah stated that she uses drugs to help "numb the depression and it started when my fiance went to prison for 2 years." Teila continues to show progress in the group setting with improving insight. She identified her family as the most positive support network in her life. "I have to be able to talk to them by phone or else I will not do well."   [employer] LCSW 04/12/2016, 10:36 AM

## 2016-04-12 NOTE — Progress Notes (Signed)
Adult Psychoeducational Group Note  Date:  04/12/2016 Time:  2:10 PM  Group Topic/Focus:  Recovery Goals:   The focus of this group is to identify appropriate goals for recovery and establish a plan to achieve them.  Participation Level:  Active  Participation Quality:  Appropriate  Affect:  Excited  Cognitive:  Appropriate  Insight: Good  Engagement in Group:  Engaged  Modes of Intervention:  Role-play  Additional Comments:  none  [place] 04/12/2016, 2:10 PM

## 2016-04-12 NOTE — Progress Notes (Signed)
Patient attended AA group meeting.  

## 2016-04-12 NOTE — Plan of Care (Signed)
Problem: Medication: Goal: Compliance with prescribed medication regimen will improve Outcome: Progressing Patient has been taking her medications as ordered this shift.

## 2016-04-13 MED ORDER — GABAPENTIN 400 MG PO CAPS
800.0000 mg | ORAL_CAPSULE | Freq: Three times a day (TID) | ORAL | Status: DC
  Administered 2016-04-14 – 2016-04-16 (×7): 800 mg via ORAL
  Filled 2016-04-13 (×13): qty 2

## 2016-04-13 MED ORDER — BUPROPION HCL 100 MG PO TABS
100.0000 mg | ORAL_TABLET | Freq: Once | ORAL | Status: AC
Start: 2016-04-13 — End: 2016-04-13
  Administered 2016-04-13: 100 mg via ORAL
  Filled 2016-04-13 (×2): qty 1

## 2016-04-13 MED ORDER — BUPROPION HCL ER (XL) 300 MG PO TB24
300.0000 mg | ORAL_TABLET | Freq: Every day | ORAL | Status: DC
  Administered 2016-04-14 – 2016-04-16 (×3): 300 mg via ORAL
  Filled 2016-04-13: qty 21
  Filled 2016-04-13: qty 1
  Filled 2016-04-13: qty 21
  Filled 2016-04-13 (×5): qty 1

## 2016-04-13 MED ORDER — PANTOPRAZOLE SODIUM 40 MG PO TBEC
40.0000 mg | DELAYED_RELEASE_TABLET | Freq: Two times a day (BID) | ORAL | Status: DC
  Administered 2016-04-14 – 2016-04-16 (×5): 40 mg via ORAL
  Filled 2016-04-13 (×3): qty 1
  Filled 2016-04-13: qty 42
  Filled 2016-04-13: qty 1
  Filled 2016-04-13: qty 42
  Filled 2016-04-13 (×3): qty 1
  Filled 2016-04-13 (×2): qty 42

## 2016-04-13 NOTE — Progress Notes (Signed)
[facility] MD Progress Note  04/13/2016 5:41 PM Haley Brady  MRN:  [id number] Subjective:   31 yo Caucasian female, single, in transitional housing. Waiting to get into rehab next month. Background history of IVDU. Injects cocaine and heroine. UDS was positive for Benzodiazepines, opiates cocaine and  THC.  Patient was evaluated at the ER two days ago for vaginal bleeding. She was eight weeks pregnant. Patient suffered a miscarriage. She was very distraught at the transitional house. Patient threw herself down the stairs. She was reported to have expressed suicidal thoughts. She attempted to overdose on cocaine and xanax.  Nursing staff reports that she has been pleasant. No behavioral issues. She has been participating with unit groups and activities.  Seen today. Seeks multiple medications " can you give me Provigil ,,,, increase my Neurontin ,,,, increase my Wellbutrin" . We agreed to titrate her Gabapentin and Bupropion today. No thoughts of violence. No thoughts of suicide.   Principal Problem: Polysubstance dependence including opioid drug with daily use ([place]) Diagnosis:   Patient Active Problem List   Diagnosis Date Noted  . Polysubstance dependence including opioid drug with daily use ([place]) [F19.20] 04/12/2016  . MDD (major depressive disorder), recurrent severe, without psychosis ([place]) [F33.2] 04/11/2016  . Dyspepsia [R10.13] 08/26/2014  . Chronic nausea [R11.0] 05/28/2011  . URINALYSIS, ABNORMAL [R82.99] 08/06/2006  . MRSA INFECTION [B95.7] 05/23/2006  . OBESITY NOS [E66.9] 05/23/2006  . ANXIETY [F41.1] 05/23/2006  . MIGRAINE HEADACHE [G43.909] 05/23/2006  . GERD [K21.9] 05/23/2006  . LOW BACK PAIN, CHRONIC [M54.5] 05/23/2006   Total Time spent with patient: 20 minutes  Past Psychiatric History: As in H&P  Past Medical History:  Past Medical History:  Diagnosis Date  . Abnormal pap   . Asymptomatic varicose veins   . Chronic back pain   . Chronic nausea   . DDD  (degenerative disc disease)   . Depression   . Esophageal reflux   . Ingrowing nail   . Migraines   . Panic disorder without agoraphobia   . Renal disorder   . Vaginal Pap smear, abnormal     Past Surgical History:  Procedure Laterality Date  . BRAVO [place] STUDY  05/2011   on nexium 40mg  daily 217 episodes of reflux, Demeester score day 1: 51.9 and Day 2: 24.8.   [facility] [place] CHOLECYSTECTOMY    . ESOPHAGOGASTRODUODENOSCOPY  06/11/2011    SMALL HIATAL HERNIA/Moderate gastritis/  BURNING CHESTPAIN: GERD, NERD, OR NON-ULCER DYSPEPSIA/ Nl esophagus  . TONSILLECTOMY     Family History:  Family History  Problem Relation Age of Onset  . Pancreatic cancer Father 36   Family Psychiatric  History: As in H&P Social History:  History  Alcohol Use  . Yes    Comment: occasionally     History  Drug Use  . Types: Cocaine, Marijuana, Methamphetamines    Social History   Social History  . Marital status: Single    Spouse name: N/A  . Number of children: 1  . Years of education: N/A   Occupational History  . [employer]   Social History Main Topics  . Smoking status: Light Tobacco Smoker    Packs/day: 0.25    Types: Cigarettes  . Smokeless tobacco: Never Used     Comment: pt reports '4 cigarettes per day."  . Alcohol use Yes     Comment: occasionally  . Drug use: Yes    Types: Cocaine, Marijuana, Methamphetamines  . Sexual activity: Yes  Birth control/ protection: None   Other Topics Concern  . None   Social History Narrative   Lives w/ daughter-5   Additional Social History:         Sleep: Good  Appetite:  Good  Current Medications: Current Facility-Administered Medications  Medication Dose Route Frequency Provider Last Rate Last Dose  . acetaminophen (TYLENOL) tablet 650 mg  650 mg Oral Q6H PRN Ethelene Hal, NP   650 mg at 04/12/16 1148  . alum & mag hydroxide-simeth (MAALOX/MYLANTA) 200-200-20 MG/5ML suspension 30 mL  30 mL Oral Q4H PRN Ethelene Hal, NP   30 mL at 04/13/16 1645  . [START ON 04/14/2016] buPROPion (WELLBUTRIN XL) 24 hr tablet 300 mg  300 mg Oral Daily [name], MD      . buPROPion [facility]) tablet 100 mg  100 mg Oral Once [name], MD      . cloNIDine (CATAPRES) tablet 0.1 mg  0.1 mg Oral QID Encarnacion Slates, NP   0.1 mg at 04/13/16 1645   Followed by  . [START ON 04/14/2016] cloNIDine (CATAPRES) tablet 0.1 mg  0.1 mg Oral BH-qamhs Encarnacion Slates, NP       Followed by  . [START ON 04/17/2016] cloNIDine (CATAPRES) tablet 0.1 mg  0.1 mg Oral QAC breakfast Encarnacion Slates, NP      . dicyclomine (BENTYL) tablet 20 mg  20 mg Oral Q6H PRN Encarnacion Slates, NP      . Derrill Memo ON 04/14/2016] gabapentin (NEURONTIN) capsule 800 mg  800 mg Oral TID [name], MD      . hydrOXYzine (ATARAX/VISTARIL) tablet 25 mg  25 mg Oral Q6H PRN Encarnacion Slates, NP   25 mg at 04/12/16 2124  . loperamide (IMODIUM) capsule 2-4 mg  2-4 mg Oral PRN Encarnacion Slates, NP      . magnesium hydroxide (MILK OF MAGNESIA) suspension 30 mL  30 mL Oral Daily PRN Ethelene Hal, NP      . methocarbamol (ROBAXIN) tablet 500 mg  500 mg Oral Q8H PRN Encarnacion Slates, NP   500 mg at 04/13/16 1647  . naproxen (NAPROSYN) tablet 500 mg  500 mg Oral BID PRN Encarnacion Slates, NP      . ondansetron (ZOFRAN-ODT) disintegrating tablet 4 mg  4 mg Oral Q6H PRN Encarnacion Slates, NP      . Derrill Memo ON 04/14/2016] pantoprazole (PROTONIX) EC tablet 40 mg  40 mg Oral BID [name], MD      . traZODone (DESYREL) tablet 100 mg  100 mg Oral QHS [name], MD   100 mg at 04/12/16 2120  . venlafaxine XR (EFFEXOR-XR) 24 hr capsule 75 mg  75 mg Oral Daily [name], MD   75 mg at 04/13/16 3716    Lab Results: No results found for this or any previous visit (from the past 48 hour(s)).  Blood Alcohol level:  Lab Results  Component Value Date   ETH <5 96/78/9381    Metabolic Disorder Labs: No results found for: HGBA1C, MPG No results  found for: PROLACTIN No results found for: CHOL, TRIG, HDL, CHOLHDL, VLDL, LDLCALC  Physical Findings: AIMS: Facial and Oral Movements Muscles of Facial Expression: None, normal Lips and Perioral Area: None, normal Jaw: None, normal Tongue: None, normal,Extremity Movements Upper (arms, wrists, hands, fingers): None, normal Lower (legs, knees, ankles, toes): None, normal, Trunk Movements Neck, shoulders, hips: None, normal, Overall Severity  Severity of abnormal movements (highest score from questions above): None, normal Incapacitation due to abnormal movements: None, normal Patient's awareness of abnormal movements (rate only patient's report): No Awareness, Dental Status Current problems with teeth and/or dentures?: No Does patient usually wear dentures?: No  CIWA:  CIWA-Ar Total: 2 COWS:  COWS Total Score: 1  Musculoskeletal: Strength & Muscle Tone: within normal limits Gait & Station: normal Patient leans: N/A  Psychiatric Specialty Exam: Physical Exam  Constitutional: She is oriented to person, place, and time. She appears well-developed and well-nourished.  HENT:  Head: Normocephalic and atraumatic.  Eyes: Conjunctivae and EOM are normal. Pupils are equal, round, and reactive to light.  Neck: Normal range of motion. Neck supple.  Cardiovascular: Normal rate, regular rhythm and normal heart sounds.   Respiratory: Effort normal and breath sounds normal.  GI: Soft. Bowel sounds are normal.  Musculoskeletal: Normal range of motion.  Neurological: She is alert and oriented to person, place, and time. She has normal reflexes.  Skin: Skin is warm and dry.  Psychiatric:  As above    ROS  Blood pressure 125/80, pulse 69, temperature 98 F (36.7 C), temperature source Oral, resp. rate 16, height 5' 3.5" (1.613 m), weight 108.7 kg (239 lb 12 oz), last menstrual period 03/11/2016.Body mass index is 41.8 kg/m.  General Appearance: Neatly dressed, pleasant, engaging well and  cooperative. Appropriate behavior. Not in any distress. Good relatedness. Not internally stimulated  Eye Contact:  Good  Speech:  Clear and Coherent  Volume:  Normal  Mood:  Euthymic  Affect:  Appropriate and Full Range  Thought Process:  Goal Directed and Linear  Orientation:  Full (Time, Place, and Person)  Thought Content:  No delusional theme. No preoccupation with violent thoughts. No negative ruminations. No obsession.  No hallucination in any modality.   Suicidal Thoughts:  No  Homicidal Thoughts:  No  Memory:  Immediate;   Good Recent;   Good Remote;   Good  Judgement:  Fair  Insight:  Shallow  Psychomotor Activity:  Normal  Concentration:  Concentration: Good and Attention Span: Good  Recall:  Good  Fund of Knowledge:  Good  Language:  Good  Akathisia:  No  Handed:    AIMS (if indicated):     Assets:  Communication Skills Desire for Improvement Resilience  ADL's:  Intact  Cognition:  WNL  Sleep:  Number of Hours: 6     Treatment Plan Summary:  Patient is coming off substances smoothly. She is no psychotic, she is not pervasively depressed. She is still focused on inpatient rehab.  Psychiatric: MDD recurrent Adjustment disorder SUD Bereavement   Medical: Recent spontaneous abortion  Psychosocial:    PLAN: 1. Increase Wellbutrin to 300 mg daily (XL) 2. Increase Gabapentin 800 mg TID 3. Continue to monitor mood, behavior and interaction with peers 4. Encourage unit groups and activities  [name], MD 04/13/2016, 5:41 PM

## 2016-04-13 NOTE — Plan of Care (Signed)
Problem: Medication: Goal: Compliance with prescribed medication regimen will improve Outcome: Progressing Patient taking medications as scheduled.

## 2016-04-13 NOTE — BHH Group Notes (Signed)
[place] LCSW Group Therapy Note  04/13/2016  and 10 - 11 AM  Type of Therapy and Topic:  Group Therapy: Avoiding Self-Sabotaging and Enabling Behaviors  Participation Level:  Active  Participation Quality:  Appropriate  Affect:  Appropriate and Depressed  Cognitive:  Alert and Oriented  Insight:  Developing/Improving  Engagement in Therapy:  Developing/Improving   Therapeutic models used: Cognitive Behavioral Therapy,  Person-Centered Therapy and Motivational Interviewing  Modes of Intervention:  Discussion, Exploration, Orientation, Rapport Building, Socialization and Support   Summary of Progress/Problems:  The main focus of today's process group was for the patient to identify ways in which they have in the past sabotaged their own recovery. Motivational Interviewing was utilized to identify motivation they may have for wanting to change. The Stages of Change were explained using a handout, and patients identified where they currently are with regard to stages of change. Patient was able to share how she engages in negative thinking and isolation which play heavily in her addiction. Patient reports she is in preparation stage of change.    Sheilah Pigeon, LCSW

## 2016-04-13 NOTE — Progress Notes (Signed)
Data. Patient denies SI/HI/AVH. Patient interacting well with staff and other patients. Patient affect continues to be bright, though patient does C/O some increase in anxiety this shift. On her self assessment patient eports 7/10 for depression, 4/10 for hopelessness and 6/10 for anxiety. Her goal for today is, "Getting info on rehab." Action. Emotional support and encouragement offered. Education provided on medication, indications and side effect. Q 15 minute checks done for safety. Response. Safety on the unit maintained through 15 minute checks.  Medications taken as prescribed. Attended groups. Remained calm and appropriate through out shift.

## 2016-04-13 NOTE — Progress Notes (Signed)
Pt attended the evening AA speaker meeting. Pt was engaged and appropriate. Jonan Seufert C, NT 04/13/16 9:40 PM 

## 2016-04-14 MED ORDER — NICOTINE POLACRILEX 2 MG MT GUM
2.0000 mg | CHEWING_GUM | OROMUCOSAL | Status: DC | PRN
  Administered 2016-04-14: 2 mg via ORAL

## 2016-04-14 NOTE — Plan of Care (Signed)
Problem: Self-Concept: Goal: Ability to disclose and discuss suicidal ideas will improve Outcome: Progressing Patient has been increasingly opening up in her conversations and moving away from being superficial.

## 2016-04-14 NOTE — Progress Notes (Signed)
D.  Pt pleasant on approach, no complaints voiced.  Pt was positive for evening AA group, observed interacting appropriately with peers on the unit.  Pt denies SI/HI/hallucinations at this time.  A.  Support and encouragement offered, medication given as ordered.

## 2016-04-14 NOTE — BHH Group Notes (Signed)
[place] Group Notes:  (Nursing/MHT/Case Management/Adjunct)  Date:  04/14/2016  Time:  2:03 PM  Type of Therapy:  Nurse Education  Participation Level:  Active  Participation Quality:  Appropriate  Affect:  Appropriate  Cognitive:  Appropriate  Insight:  Appropriate  Engagement in Group:  Engaged  Modes of Intervention:  Discussion and Education  Summary of Progress/Problems:   Healthy coping skills.  Cheri Kearns 04/14/2016, 2:03 PM

## 2016-04-14 NOTE — BHH Group Notes (Signed)
[place] LCSW Group Therapy  04/14/2016 10 - 11 AM  Type of Therapy:  Group Therapy  Participation Level:  Active  Participation Quality:  Attentive and Sharing  Affect:  Flat  Cognitive:  Alert and Oriented  Insight:  Developing/Improving  Engagement in Therapy:  Developing/Improving  Modes of Intervention:  Clarification, Discussion, Socialization and Support  Summary of Progress/Problems: The main focus of today's process group was to identify the patient's current support system and decide on other supports that can be put in place. An emphasis was placed on using counselor, doctor, therapy groups, 12-step groups, and problem-specific support groups to expand supports. There was also an extensive discussion about what constitutes a healthy support versus an unhealthy support. Patients had opportunity to participate in activity using visuals. Pt chose a visual to represent decompensation as being stuck and improvement as being with her child. Patient disclosed recent loss of a child and declined opportunity to explore feelings related to loss.   Haley Pigeon, LCSW

## 2016-04-14 NOTE — Progress Notes (Signed)
Patient attended AA group meeting.  

## 2016-04-14 NOTE — Progress Notes (Signed)
[facility] MD Progress Note  04/14/2016 10:49 AM JENNFIER ABDULLA  MRN:  [id number] Subjective:   31 yo Caucasian female, single, in transitional housing. Waiting to get into rehab next month. Background history of IVDU. Injects cocaine and heroine. UDS was positive for Benzodiazepines, opiates cocaine and  THC.  Patient was evaluated at the ER two days ago for vaginal bleeding. She was eight weeks pregnant. Patient suffered a miscarriage. She was very distraught at the transitional house. Patient threw herself down the stairs. She was reported to have expressed suicidal thoughts. She attempted to overdose on cocaine and xanax.  Seen today. States that she is feeling better. Says her depression has dropped to 6/10 with 10 being the highest. Says she is tolerating her medications well. No thoughts of suicide. Says is sleeping well and she is enjoying interactions with peers. Patient is still committed to inpatient rehab.   Nursing staff reports that patient has been appropriate on the unit. Patient has been interacting well with peers. No behavioral issues. Patient has not voiced any suicidal thoughts. Patient has not been observed to be internally stimulated. Patient has been adherent with treatment recommendations. Patient has been tolerating their medication well.    Principal Problem: Polysubstance dependence including opioid drug with daily use ([place]) Diagnosis:   Patient Active Problem List   Diagnosis Date Noted  . Polysubstance dependence including opioid drug with daily use ([place]) [F19.20] 04/12/2016  . MDD (major depressive disorder), recurrent severe, without psychosis ([place]) [F33.2] 04/11/2016  . Dyspepsia [R10.13] 08/26/2014  . Chronic nausea [R11.0] 05/28/2011  . URINALYSIS, ABNORMAL [R82.99] 08/06/2006  . MRSA INFECTION [B95.7] 05/23/2006  . OBESITY NOS [E66.9] 05/23/2006  . ANXIETY [F41.1] 05/23/2006  . MIGRAINE HEADACHE [G43.909] 05/23/2006  . GERD [K21.9] 05/23/2006  . LOW BACK PAIN,  CHRONIC [M54.5] 05/23/2006   Total Time spent with patient: 20 minutes  Past Psychiatric History: As in H&P  Past Medical History:  Past Medical History:  Diagnosis Date  . Abnormal pap   . Asymptomatic varicose veins   . Chronic back pain   . Chronic nausea   . DDD (degenerative disc disease)   . Depression   . Esophageal reflux   . Ingrowing nail   . Migraines   . Panic disorder without agoraphobia   . Renal disorder   . Vaginal Pap smear, abnormal     Past Surgical History:  Procedure Laterality Date  . BRAVO [place] STUDY  05/2011   on nexium 40mg  daily 217 episodes of reflux, Demeester score day 1: 51.9 and Day 2: 24.8.   Marland Kitchen CHOLECYSTECTOMY    . ESOPHAGOGASTRODUODENOSCOPY  06/11/2011    SMALL HIATAL HERNIA/Moderate gastritis/  BURNING CHESTPAIN: GERD, NERD, OR NON-ULCER DYSPEPSIA/ Nl esophagus  . TONSILLECTOMY     Family History:  Family History  Problem Relation Age of Onset  . Pancreatic cancer Father 24   Family Psychiatric  History: As in H&P Social History:  History  Alcohol Use  . Yes    Comment: occasionally     History  Drug Use  . Types: Cocaine, Marijuana, Methamphetamines    Social History   Social History  . Marital status: Single    Spouse name: N/A  . Number of children: 1  . Years of education: N/A   Occupational History  . [employer]   Social History Main Topics  . Smoking status: Light Tobacco Smoker    Packs/day: 0.25    Types: Cigarettes  . Smokeless  tobacco: Never Used     Comment: pt reports '4 cigarettes per day."  . Alcohol use Yes     Comment: occasionally  . Drug use: Yes    Types: Cocaine, Marijuana, Methamphetamines  . Sexual activity: Yes    Birth control/ protection: None   Other Topics Concern  . None   Social History Narrative   Lives w/ daughter-5   Additional Social History:         Sleep: Good  Appetite:  Good  Current Medications: Current Facility-Administered Medications   Medication Dose Route Frequency Provider Last Rate Last Dose  . acetaminophen (TYLENOL) tablet 650 mg  650 mg Oral Q6H PRN Ethelene Hal, NP   650 mg at 04/12/16 1148  . alum & mag hydroxide-simeth (MAALOX/MYLANTA) 200-200-20 MG/5ML suspension 30 mL  30 mL Oral Q4H PRN Ethelene Hal, NP   30 mL at 04/13/16 1645  . buPROPion (WELLBUTRIN XL) 24 hr tablet 300 mg  300 mg Oral Daily [name], MD   300 mg at 04/14/16 0737  . cloNIDine (CATAPRES) tablet 0.1 mg  0.1 mg Oral QID Encarnacion Slates, NP   0.1 mg at 04/14/16 0736   Followed by  . cloNIDine (CATAPRES) tablet 0.1 mg  0.1 mg Oral BH-qamhs Encarnacion Slates, NP       Followed by  . [START ON 04/17/2016] cloNIDine (CATAPRES) tablet 0.1 mg  0.1 mg Oral QAC breakfast Encarnacion Slates, NP      . dicyclomine (BENTYL) tablet 20 mg  20 mg Oral Q6H PRN Encarnacion Slates, NP      . gabapentin (NEURONTIN) capsule 800 mg  800 mg Oral TID [name], MD   800 mg at 04/14/16 0735  . hydrOXYzine (ATARAX/VISTARIL) tablet 25 mg  25 mg Oral Q6H PRN Encarnacion Slates, NP   25 mg at 04/13/16 2116  . loperamide (IMODIUM) capsule 2-4 mg  2-4 mg Oral PRN Encarnacion Slates, NP   4 mg at 04/14/16 0738  . magnesium hydroxide (MILK OF MAGNESIA) suspension 30 mL  30 mL Oral Daily PRN Ethelene Hal, NP      . methocarbamol (ROBAXIN) tablet 500 mg  500 mg Oral Q8H PRN Encarnacion Slates, NP   500 mg at 04/13/16 1647  . naproxen (NAPROSYN) tablet 500 mg  500 mg Oral BID PRN Encarnacion Slates, NP      . ondansetron (ZOFRAN-ODT) disintegrating tablet 4 mg  4 mg Oral Q6H PRN Encarnacion Slates, NP      . pantoprazole (PROTONIX) EC tablet 40 mg  40 mg Oral BID [name], MD   40 mg at 04/14/16 9381  . traZODone (DESYREL) tablet 100 mg  100 mg Oral QHS [name], MD   100 mg at 04/13/16 2116  . venlafaxine XR (EFFEXOR-XR) 24 hr capsule 75 mg  75 mg Oral Daily [name], MD   75 mg at 04/14/16 0175    Lab Results: No results found for this or any  previous visit (from the past 48 hour(s)).  Blood Alcohol level:  Lab Results  Component Value Date   ETH <5 11/21/8525    Metabolic Disorder Labs: No results found for: HGBA1C, MPG No results found for: PROLACTIN No results found for: CHOL, TRIG, HDL, CHOLHDL, VLDL, LDLCALC  Physical Findings: AIMS: Facial and Oral Movements Muscles of Facial Expression: None, normal Lips and Perioral Area: None, normal Jaw: None, normal Tongue: None,  normal,Extremity Movements Upper (arms, wrists, hands, fingers): None, normal Lower (legs, knees, ankles, toes): None, normal, Trunk Movements Neck, shoulders, hips: None, normal, Overall Severity Severity of abnormal movements (highest score from questions above): None, normal Incapacitation due to abnormal movements: None, normal Patient's awareness of abnormal movements (rate only patient's report): No Awareness, Dental Status Current problems with teeth and/or dentures?: No Does patient usually wear dentures?: No  CIWA:  CIWA-Ar Total: 2 COWS:  COWS Total Score: 1  Musculoskeletal: Strength & Muscle Tone: within normal limits Gait & Station: normal Patient leans: N/A  Psychiatric Specialty Exam: Physical Exam  Constitutional: She is oriented to person, place, and time. She appears well-developed and well-nourished.  HENT:  Head: Normocephalic and atraumatic.  Eyes: Conjunctivae and EOM are normal. Pupils are equal, round, and reactive to light.  Neck: Normal range of motion. Neck supple.  Cardiovascular: Normal rate, regular rhythm and normal heart sounds.   Respiratory: Effort normal and breath sounds normal.  GI: Soft. Bowel sounds are normal.  Musculoskeletal: Normal range of motion.  Neurological: She is alert and oriented to person, place, and time. She has normal reflexes.  Skin: Skin is warm and dry.  Psychiatric:  As above    ROS  Blood pressure 129/88, pulse 69, temperature 97.8 F (36.6 C), temperature source Oral,  resp. rate 18, height 5' 3.5" (1.613 m), weight 108.7 kg (239 lb 12 oz), last menstrual period 03/11/2016.Body mass index is 41.8 kg/m.  General Appearance: Neatly dressed, pleasant, engaging well and cooperative. Appropriate behavior. Not in any distress. Good relatedness. Not internally stimulated  Eye Contact:  Good  Speech:  Clear and Coherent  Volume:  Normal  Mood:  Euthymic  Affect:  Appropriate and Full Range  Thought Process:  Goal Directed and Linear  Orientation:  Full (Time, Place, and Person)  Thought Content:  No delusional theme. No preoccupation with violent thoughts. No negative ruminations. No obsession.  No hallucination in any modality.   Suicidal Thoughts:  No  Homicidal Thoughts:  No  Memory:  Immediate;   Good Recent;   Good Remote;   Good  Judgement:  Fair  Insight:  Shallow  Psychomotor Activity:  Normal  Concentration:  Concentration: Good and Attention Span: Good  Recall:  Good  Fund of Knowledge:  Good  Language:  Good  Akathisia:  No  Handed:    AIMS (if indicated):     Assets:  Communication Skills Desire for Improvement Resilience  ADL's:  Intact  Cognition:  WNL  Sleep:  Number of Hours: 6.75     Treatment Plan Summary:  Patient is  not psychotic, she is not depressed. She is still focused on inpatient rehab. Hopeful discharge as soon as we have appropriate placement.   Psychiatric: MDD recurrent Adjustment disorder SUD Bereavement   Medical: Recent spontaneous abortion  Psychosocial:    PLAN: 1. Continue current regimen 2. Continue to monitor mood, behavior and interaction with peers   [name], MD 04/14/2016, 10:49 AMPatient ID: [name], female   DOB: [birth year], 31 y.o.   MRN: [id number]

## 2016-04-14 NOTE — Progress Notes (Signed)
Patient ID: [name], female   DOB: 08-24-1985, 31 y.o.   MRN: [id number]  Pt currently presents with a flat affect and labile behavior. Pt seen interacting with daughter on the unit during visitation, laughing and smiling. Pt becomes tearful, states that she is "worthless." Pt reports to writer that their goal is to "get sober for my daughter and boyfriend" Pt reports anxiety over matters at home. Pt reports good sleep with current medication regimen.   Pt provided with medications per providers orders. Pt's labs and vitals were monitored throughout the night. Pt given a 1:1 about emotional and mental status. Pt supported and encouraged to express concerns and questions. Pt educated on medications and assertiveness techniques.   Pt's safety ensured with 15 minute and environmental checks. Pt currently denies SI/HI and A/V hallucinations. Pt verbally agrees to seek staff if SI/HI or A/VH occurs and to consult with staff before acting on any harmful thoughts. Pt hopefull to go to a longer term rehab and move out of [place] post discharge. Will continue POC.

## 2016-04-14 NOTE — Progress Notes (Signed)
Data. Patient denies SI/HI/AVH. Patient interacting well with staff and other patients.  Affect is bright. On her self assessment patient reports 6/10 for depression and anxiety and 2/10 for hopelessness. Her goal for today is, "Continue to search for rehabs." Action. Emotional support and encouragement offered. Education provided on medication, indications and side effect. Q 15 minute checks done for safety. Response. Safety on the unit maintained through 15 minute checks.  Medications taken as prescribed. Attended groups. Remained calm and appropriate through out shift.

## 2016-04-15 DIAGNOSIS — F1290 Cannabis use, unspecified, uncomplicated: Secondary | ICD-10-CM

## 2016-04-15 DIAGNOSIS — F17210 Nicotine dependence, cigarettes, uncomplicated: Secondary | ICD-10-CM

## 2016-04-15 DIAGNOSIS — F332 Major depressive disorder, recurrent severe without psychotic features: Secondary | ICD-10-CM

## 2016-04-15 DIAGNOSIS — Z79899 Other long term (current) drug therapy: Secondary | ICD-10-CM

## 2016-04-15 DIAGNOSIS — F1490 Cocaine use, unspecified, uncomplicated: Secondary | ICD-10-CM

## 2016-04-15 DIAGNOSIS — F1920 Other psychoactive substance dependence, uncomplicated: Secondary | ICD-10-CM

## 2016-04-15 MED ORDER — NICOTINE 21 MG/24HR TD PT24
21.0000 mg | MEDICATED_PATCH | Freq: Every day | TRANSDERMAL | Status: DC
  Administered 2016-04-15 – 2016-04-16 (×2): 21 mg via TRANSDERMAL
  Filled 2016-04-15 (×4): qty 1

## 2016-04-15 MED ORDER — GABAPENTIN 800 MG PO TABS
800.0000 mg | ORAL_TABLET | Freq: Three times a day (TID) | ORAL | Status: DC
  Filled 2016-04-15 (×2): qty 63

## 2016-04-15 NOTE — Progress Notes (Signed)
The patient shared in [place] group this evening that she had a good day overall and that she anticipates being discharged tomorrow to [facility]. In terms of the theme for the day, her wellness strategy will be to watch what she eats.

## 2016-04-15 NOTE — Tx Team (Signed)
Interdisciplinary Treatment and Diagnostic Plan Update  04/15/2016 Time of Session: 0930 Haley Brady MRN: [id number]  Principal Diagnosis: Polysubstance dependence including opioid drug with daily use ([facility])  Secondary Diagnoses: Principal Problem:   Polysubstance dependence including opioid drug with daily use ([facility]) Active Problems:   MDD (major depressive disorder), recurrent severe, without psychosis ([facility])   Current Medications:  Current Facility-Administered Medications  Medication Dose Route Frequency Provider Last Rate Last Dose  . acetaminophen (TYLENOL) tablet 650 mg  650 mg Oral Q6H PRN Laveda Abbe, NP   650 mg at 04/15/16 1129  . alum & mag hydroxide-simeth (MAALOX/MYLANTA) 200-200-20 MG/5ML suspension 30 mL  30 mL Oral Q4H PRN Laveda Abbe, NP   30 mL at 04/13/16 1645  . buPROPion (WELLBUTRIN XL) 24 hr tablet 300 mg  300 mg Oral Daily Georgiann Cocker, MD   300 mg at 04/15/16 0753  . cloNIDine (CATAPRES) tablet 0.1 mg  0.1 mg Oral BH-qamhs Sanjuana Kava, NP   0.1 mg at 04/15/16 0753   Followed by  . [START ON 04/17/2016] cloNIDine (CATAPRES) tablet 0.1 mg  0.1 mg Oral QAC breakfast Sanjuana Kava, NP      . dicyclomine (BENTYL) tablet 20 mg  20 mg Oral Q6H PRN Sanjuana Kava, NP      . gabapentin (NEURONTIN) capsule 800 mg  800 mg Oral TID Georgiann Cocker, MD   800 mg at 04/15/16 1127  . hydrOXYzine (ATARAX/VISTARIL) tablet 25 mg  25 mg Oral Q6H PRN Sanjuana Kava, NP   25 mg at 04/14/16 2020  . loperamide (IMODIUM) capsule 2-4 mg  2-4 mg Oral PRN Sanjuana Kava, NP   4 mg at 04/14/16 0738  . magnesium hydroxide (MILK OF MAGNESIA) suspension 30 mL  30 mL Oral Daily PRN Laveda Abbe, NP      . methocarbamol (ROBAXIN) tablet 500 mg  500 mg Oral Q8H PRN Sanjuana Kava, NP   500 mg at 04/15/16 0800  . naproxen (NAPROSYN) tablet 500 mg  500 mg Oral BID PRN Sanjuana Kava, NP   500 mg at 04/15/16 0759  . nicotine (NICODERM CQ - dosed in mg/24 hours) patch  21 mg  21 mg Transdermal Daily Jackelyn Poling, NP   21 mg at 04/15/16 0754  . ondansetron (ZOFRAN-ODT) disintegrating tablet 4 mg  4 mg Oral Q6H PRN Sanjuana Kava, NP      . pantoprazole (PROTONIX) EC tablet 40 mg  40 mg Oral BID Georgiann Cocker, MD   40 mg at 04/15/16 0644  . traZODone (DESYREL) tablet 100 mg  100 mg Oral QHS Georgiann Cocker, MD   100 mg at 04/14/16 2106  . venlafaxine XR (EFFEXOR-XR) 24 hr capsule 75 mg  75 mg Oral Daily Georgiann Cocker, MD   75 mg at 04/15/16 0754   PTA Medications: Prescriptions Prior to Admission  Medication Sig Dispense Refill Last Dose  . omeprazole (PRILOSEC) 40 MG capsule Take 1 capsule by mouth 2 (two) times daily.   04/10/2016 at Unknown time  . oxyCODONE-acetaminophen (PERCOCET) 10-325 MG tablet Take 1 tablet by mouth every 4 (four) hours as needed for pain.    04/10/2016 at Unknown time  . ranitidine (ZANTAC) 300 MG tablet Take 300 mg by mouth at bedtime.   04/10/2016 at Unknown time    Patient Stressors: Substance abuse  Patient Strengths: Average or above average intelligence Communication skills General fund of knowledge Motivation  for treatment/growth Physical Health Supportive family/friends Work skills  Treatment Modalities: Medication Management, Group therapy, Case management,  1 to 1 session with clinician, Psychoeducation, Recreational therapy.   Physician Treatment Plan for Primary Diagnosis: Polysubstance dependence including opioid drug with daily use ([place]) Long Term Goal(s): Improvement in symptoms so as ready for discharge Improvement in symptoms so as ready for discharge   Short Term Goals: Ability to identify changes in lifestyle to reduce recurrence of condition will improve Ability to verbalize feelings will improve Ability to disclose and discuss suicidal ideas Ability to demonstrate self-control will improve Ability to demonstrate self-control will improve Ability to identify and develop effective coping  behaviors will improve Compliance with prescribed medications will improve Ability to identify triggers associated with substance abuse/mental health issues will improve  Medication Management: Evaluate patient's response, side effects, and tolerance of medication regimen.  Therapeutic Interventions: 1 to 1 sessions, Unit Group sessions and Medication administration.  Evaluation of Outcomes: Met  Physician Treatment Plan for Secondary Diagnosis: Principal Problem:   Polysubstance dependence including opioid drug with daily use ([place]) Active Problems:   MDD (major depressive disorder), recurrent severe, without psychosis ([place])  Long Term Goal(s): Improvement in symptoms so as ready for discharge Improvement in symptoms so as ready for discharge   Short Term Goals: Ability to identify changes in lifestyle to reduce recurrence of condition will improve Ability to verbalize feelings will improve Ability to disclose and discuss suicidal ideas Ability to demonstrate self-control will improve Ability to demonstrate self-control will improve Ability to identify and develop effective coping behaviors will improve Compliance with prescribed medications will improve Ability to identify triggers associated with substance abuse/mental health issues will improve     Medication Management: Evaluate patient's response, side effects, and tolerance of medication regimen.  Therapeutic Interventions: 1 to 1 sessions, Unit Group sessions and Medication administration.  Evaluation of Outcomes: Met  RN Treatment Plan for Primary Diagnosis: Polysubstance dependence including opioid drug with daily use ([name]) Long Term Goal(s): Knowledge of disease and therapeutic regimen to maintain health will improve  Short Term Goals: Ability to remain free from injury will improve, Ability to verbalize feelings will improve and Ability to disclose and discuss suicidal ideas  Medication Management: RN will administer  medications as ordered by provider, will assess and evaluate patient's response and provide education to patient for prescribed medication. RN will report any adverse and/or side effects to prescribing provider.  Therapeutic Interventions: 1 on 1 counseling sessions, Psychoeducation, Medication administration, Evaluate responses to treatment, Monitor vital signs and CBGs as ordered, Perform/monitor CIWA, COWS, AIMS and Fall Risk screenings as ordered, Perform wound care treatments as ordered.  Evaluation of Outcomes: Met  LCSW Treatment Plan for Primary Diagnosis: Polysubstance dependence including opioid drug with daily use ([place]) Long Term Goal(s): Safe transition to appropriate next level of care at discharge, Engage patient in therapeutic group addressing interpersonal concerns.  Short Term Goals: Engage patient in aftercare planning with referrals and resources, Facilitate patient progression through stages of change regarding substance use diagnoses and concerns and Identify triggers associated with mental health/substance abuse issues  Therapeutic Interventions: Assess for all discharge needs, 1 to 1 time with Social worker, Explore available resources and support systems, Assess for adequacy in community support network, Educate family and significant other(s) on suicide prevention, Complete Psychosocial Assessment, Interpersonal group therapy.  Evaluation of Outcomes: Met  Progress in Treatment: Attending groups: Yes.  Participating in groups: Yes Taking medication as prescribed: Yes. Toleration medication: Yes. Family/Significant  other contact made:SPE completed with pt's sister.  Patient understands diagnosis: Yes. Discussing patient identified problems/goals with staff: Yes. Medical problems stabilized or resolved: Yes. Denies suicidal/homicidal ideation: Yes, self report.  Issues/concerns per patient self-inventory: No. Other: n/a   New problem(s) identified: No, Describe:   n/a   New Short Term/Long Term Goal(s): detox; medication management; mood stabilization; development of comprehensive mental wellness/sobriety plan.   Discharge Plan or Barriers: Pt accepted to [facility].  Reason for Continuation of Hospitalization: medication management   Estimated Length of Stay: 1 day   Attendees: Patient: 04/15/2016 3:16 PM  Physician: Dr. Mallie Darting MD 04/15/2016 3:16 PM  Nursing: Parthenia Ames RN 04/15/2016 3:16 PM  RN Care Manager: Lars Pinks CM 04/15/2016 3:16 PM  Social Worker: Maxie Better, LCSW; 04/15/2016 3:16 PM  Recreational Therapist: Rhunette Croft 04/15/2016 3:16 PM  Other: Ricky Ala NP; Lindell Spar NP 04/15/2016 3:16 PM  Other:  04/15/2016 3:16 PM  Other: 04/15/2016 3:16 PM    Scribe for Treatment Team: Kimber Relic Smart, LCSW 04/15/2016 3:16 PM

## 2016-04-15 NOTE — Progress Notes (Signed)
[facility] MD Progress Note  04/15/2016 10:46 AM Haley Brady  MRN:  [id number] Subjective:  Patient is a 31 YO female Principal Problem: Polysubstance dependence including opioid drug with daily use ([place]) Diagnosis:   Patient Active Problem List   Diagnosis Date Noted  . Polysubstance dependence including opioid drug with daily use ([place]) [F19.20] 04/12/2016  . MDD (major depressive disorder), recurrent severe, without psychosis ([place]) [F33.2] 04/11/2016  . Dyspepsia [R10.13] 08/26/2014  . Chronic nausea [R11.0] 05/28/2011  . URINALYSIS, ABNORMAL [R82.99] 08/06/2006  . MRSA INFECTION [B95.7] 05/23/2006  . OBESITY NOS [E66.9] 05/23/2006  . ANXIETY [F41.1] 05/23/2006  . MIGRAINE HEADACHE [G43.909] 05/23/2006  . GERD [K21.9] 05/23/2006  . LOW BACK PAIN, CHRONIC [M54.5] 05/23/2006   Total Time spent with patient: 20 minutes  Past Psychiatric History: see H&P  Past Medical History:  Past Medical History:  Diagnosis Date  . Abnormal pap   . Asymptomatic varicose veins   . Chronic back pain   . Chronic nausea   . DDD (degenerative disc disease)   . Depression   . Esophageal reflux   . Ingrowing nail   . Migraines   . Panic disorder without agoraphobia   . Renal disorder   . Vaginal Pap smear, abnormal     Past Surgical History:  Procedure Laterality Date  . BRAVO [place] STUDY  05/2011   on nexium 40mg  daily 217 episodes of reflux, [place]ter score day 1: 51.9 and Day 2: 24.8.   [place] CHOLECYSTECTOMY    . ESOPHAGOGASTRODUODENOSCOPY  06/11/2011    SMALL HIATAL HERNIA/Moderate gastritis/  BURNING CHESTPAIN: GERD, NERD, OR NON-ULCER DYSPEPSIA/ Nl esophagus  . TONSILLECTOMY     Family History:  Family History  Problem Relation Age of Onset  . Pancreatic cancer Father 18   Family Psychiatric  History: see H&P Social History:  History  Alcohol Use  . Yes    Comment: occasionally     History  Drug Use  . Types: Cocaine, Marijuana, Methamphetamines    Social History   Social  History  . Marital status: Single    Spouse name: N/A  . Number of children: 1  . Years of education: N/A   Occupational History  . [employer]   Social History Main Topics  . Smoking status: Light Tobacco Smoker    Packs/day: 0.25    Types: Cigarettes  . Smokeless tobacco: Never Used     Comment: pt reports '4 cigarettes per day."  . Alcohol use Yes     Comment: occasionally  . Drug use: Yes    Types: Cocaine, Marijuana, Methamphetamines  . Sexual activity: Yes    Birth control/ protection: None   Other Topics Concern  . None   Social History Narrative   Lives w/ daughter-5   Additional Social History:                         Sleep: Fair  Appetite:  Good  Current Medications: Current Facility-Administered Medications  Medication Dose Route Frequency Provider Last Rate Last Dose  . acetaminophen (TYLENOL) tablet 650 mg  650 mg Oral Q6H PRN Ethelene Hal, NP   650 mg at 04/14/16 1635  . alum & mag hydroxide-simeth (MAALOX/MYLANTA) 200-200-20 MG/5ML suspension 30 mL  30 mL Oral Q4H PRN Ethelene Hal, NP   30 mL at 04/13/16 1645  . buPROPion (WELLBUTRIN XL) 24 hr tablet 300 mg  300 mg Oral Daily Vincent A  Izediuno, MD   300 mg at 04/15/16 0753  . cloNIDine (CATAPRES) tablet 0.1 mg  0.1 mg Oral BH-qamhs Encarnacion Slates, NP   0.1 mg at 04/15/16 0753   Followed by  . [START ON 04/17/2016] cloNIDine (CATAPRES) tablet 0.1 mg  0.1 mg Oral QAC breakfast Encarnacion Slates, NP      . dicyclomine (BENTYL) tablet 20 mg  20 mg Oral Q6H PRN Encarnacion Slates, NP      . gabapentin (NEURONTIN) capsule 800 mg  800 mg Oral TID [name], MD   800 mg at 04/15/16 0753  . hydrOXYzine (ATARAX/VISTARIL) tablet 25 mg  25 mg Oral Q6H PRN Encarnacion Slates, NP   25 mg at 04/14/16 2020  . loperamide (IMODIUM) capsule 2-4 mg  2-4 mg Oral PRN Encarnacion Slates, NP   4 mg at 04/14/16 0738  . magnesium hydroxide (MILK OF MAGNESIA) suspension 30 mL  30 mL Oral Daily PRN  Ethelene Hal, NP      . methocarbamol (ROBAXIN) tablet 500 mg  500 mg Oral Q8H PRN Encarnacion Slates, NP   500 mg at 04/15/16 0800  . naproxen (NAPROSYN) tablet 500 mg  500 mg Oral BID PRN Encarnacion Slates, NP   500 mg at 04/15/16 0759  . nicotine (NICODERM CQ - dosed in mg/24 hours) patch 21 mg  21 mg Transdermal Daily [name], NP   21 mg at 04/15/16 0754  . ondansetron (ZOFRAN-ODT) disintegrating tablet 4 mg  4 mg Oral Q6H PRN Encarnacion Slates, NP      . pantoprazole (PROTONIX) EC tablet 40 mg  40 mg Oral BID [name], MD   40 mg at 04/15/16 0644  . traZODone (DESYREL) tablet 100 mg  100 mg Oral QHS [name], MD   100 mg at 04/14/16 2106  . venlafaxine XR (EFFEXOR-XR) 24 hr capsule 75 mg  75 mg Oral Daily [name], MD   75 mg at 04/15/16 0754    Lab Results: No results found for this or any previous visit (from the past 48 hour(s)).  Blood Alcohol level:  Lab Results  Component Value Date   ETH <5 36/62/9476    Metabolic Disorder Labs: No results found for: HGBA1C, MPG No results found for: PROLACTIN No results found for: CHOL, TRIG, HDL, CHOLHDL, VLDL, LDLCALC  Physical Findings: AIMS: Facial and Oral Movements Muscles of Facial Expression: None, normal Lips and Perioral Area: None, normal Jaw: None, normal Tongue: None, normal,Extremity Movements Upper (arms, wrists, hands, fingers): None, normal Lower (legs, knees, ankles, toes): None, normal, Trunk Movements Neck, shoulders, hips: None, normal, Overall Severity Severity of abnormal movements (highest score from questions above): None, normal Incapacitation due to abnormal movements: None, normal Patient's awareness of abnormal movements (rate only patient's report): No Awareness, Dental Status Current problems with teeth and/or dentures?: No Does patient usually wear dentures?: No  CIWA:  CIWA-Ar Total: 2 COWS:  COWS Total Score: 2  Musculoskeletal: Strength & Muscle Tone: within normal  limits Gait & Station: normal Patient leans: N/A  Psychiatric Specialty Exam: Physical Exam  Nursing note and vitals reviewed.   ROS  Blood pressure 123/72, pulse 67, temperature 98.2 F (36.8 C), temperature source Oral, resp. rate 18, height 5' 3.5" (1.613 m), weight 108.7 kg (239 lb 12 oz), last menstrual period 03/11/2016.Body mass index is 41.8 kg/m.  General Appearance: Casual  Eye Contact:  Good  Speech:  Clear and  Coherent  Volume:  Normal  Mood:  Anxious  Affect:  Appropriate  Thought Process:  Coherent  Orientation:  Full (Time, Place, and Person)  Thought Content:  Logical  Suicidal Thoughts:  No  Homicidal Thoughts:  No  Memory:  Immediate;   Good  Judgement:  Intact  Insight:  Fair  Psychomotor Activity:  Restlessness  Concentration:  Concentration: Fair  Recall:  [place] of Knowledge:  Good  Language:  Good  Akathisia:  No  Handed:  Right  AIMS (if indicated):     Assets:  Communication Skills Desire for Improvement Resilience Vocational/Educational  ADL's:  Intact  Cognition:  WNL  Sleep:  Number of Hours: 5.75     Treatment Plan Summary: Daily contact with patient to assess and evaluate symptoms and progress in treatment, Medication management and Plan Patient is seen and examined. Patient is a 31 YO female with the above stated PPHx who is seen in follow up. Patient has been accepted into [facility] and so we will plan on discharge tomorrow. She is concerned about her blood pressure which has been occasionally elevated, most likely due to the opiate withdrawal. I will hold on any medications for that for now given D/C tomorrow. No other changes in medications and hopefully everything will go smoothly over the next 24 hours. No SI/HI/ or psychotic symptoms.  Sharma Covert, MD 04/15/2016, 10:46 AM

## 2016-04-15 NOTE — Plan of Care (Signed)
Problem: Education: Goal: Ability to make informed decisions regarding treatment will improve Outcome: Progressing Nurse discussed depression/anxiety/coping skills with patient.    

## 2016-04-15 NOTE — BHH Group Notes (Signed)
[place] LCSW Group Therapy  04/15/2016 3:04 PM  Type of Therapy:  Group Therapy  Participation Level:  Active  Participation Quality:  Attentive  Affect:  Appropriate  Cognitive:  Alert and Oriented  Insight:  Improving  Engagement in Therapy:  Improving  Modes of Intervention:  Confrontation, Discussion, Education, Socialization and Support  Summary of Progress/Problems: Today's Topic: Overcoming Obstacles. Patients identified one short term goal and potential obstacles in reaching this goal. Patients processed barriers involved in overcoming these obstacles. Patients identified steps necessary for overcoming these obstacles and explored motivation (internal and external) for facing these difficulties head on.   Jinny Sweetland N Smart LCSW 04/15/2016, 3:04 PM

## 2016-04-15 NOTE — Progress Notes (Signed)
Recreation Therapy Notes  Date: 04/15/16 Time: 0930 Location: 300 [address] Dayroom  Group Topic: Stress Management  Goal Area(s) Addresses:  Patient will verbalize importance of using healthy stress management.  Patient will identify positive emotions associated with healthy stress management.   Behavioral Response: Engaged  Intervention: Stress Management  Activity :  Stress Meditation.  LRT introduced the stress management technique of meditation.  LRT played a meditation on stress management from the Calm app to engage in the meditation.  Patients were to follow along as the meditation was played to partake in the meditation.  Education:  Stress Management, Discharge Planning.   Education Outcome: Acknowledges edcuation/In group clarification offered/Needs additional education  Clinical Observations/Feedback: Pt attended group.    [name], LRT/CTRS         [name] A 04/15/2016 11:31 AM

## 2016-04-15 NOTE — BHH Suicide Risk Assessment (Signed)
[place] INPATIENT:  Family/Significant Other Suicide Prevention Education  Suicide Prevention Education:  Education Completed; Docia Chuck (pt's sister) [phone] has been identified by the patient as the family member/significant other with whom the patient will be residing, and identified as the person(s) who will aid the patient in the event of a mental health crisis (suicidal ideations/suicide attempt).  With written consent from the patient, the family member/significant other has been provided the following suicide prevention education, prior to the and/or following the discharge of the patient.  The suicide prevention education provided includes the following:  Suicide risk factors  Suicide prevention and interventions  National Suicide Hotline telephone number  [facility] assessment telephone number  [facility] Emergency Assistance [place] and/or Residential Mobile Crisis Unit telephone number  Request made of family/significant other to:  Remove weapons (e.g., guns, rifles, knives), all items previously/currently identified as safety concern.    Remove drugs/medications (over-the-counter, prescriptions, illicit drugs), all items previously/currently identified as a safety concern.  The family member/significant other verbalizes understanding of the suicide prevention education information provided.  The family member/significant other agrees to remove the items of safety concern listed above.  Myrian Botello N Smart LCSW 04/15/2016, 3:10 PM

## 2016-04-15 NOTE — Progress Notes (Signed)
D:  Patient's self inventory sheet, patient has poor            sleep, sleep medication not helpful.  Fair appetite, low energy level, poor concentration.  Rated depression and anxiety 8, hopeless 7.  Withdrawals, diarrhea, chilling, agitation, cravings.  Denied SI.  Denied physical problems.  Denied pain.  Goal is find rehab.  Plans to talk to SW.  Does have discharge plans. A:  Medications administered per MD orders.  Emotional support and encouragement given patient. R:  Denied SI and HI, contracts for safety.  Denied A/V hallucinations.  Safety maintained with 15 minute checks.

## 2016-04-15 NOTE — Progress Notes (Signed)
  [facility] Adult Case Management Discharge Plan :  Will you be returning to the same living situation after discharge:  No.Pt accepted to [facility].  At discharge, do you have transportation home?: Yes,  [facility] transport will pick you up at 9:30AM on Tuesday for transport to facility. Thank you. Do you have the ability to pay for your medications: Yes,  mental health  Release of information consent forms completed and submitted to medical records by CSW.  Patient to Follow up at: Follow-up Information    [facility] Follow up on 04/16/2016.   Why:  You have been accepted to [facility] for today. [facility] driver will pick you up directly from the hospital. Please make sure to have 21 day supply of medications. Thank you.  Contact information: [address] [place], [place] [address] Phone: [phone] Fax: [phone]          Next level of care provider has access to [place] and Suicide Prevention discussed: Yes,  SPE completed with pts' sister  Have you used any form of tobacco in the last 30 days? (Cigarettes, Smokeless Tobacco, Cigars, and/or Pipes): Yes  Has patient been referred to the Quitline?: Patient refused referral  Patient has been referred for addiction treatment: Yes  Cadin Luka N Smart LCSW 04/15/2016, 1:42 PM

## 2016-04-15 NOTE — Progress Notes (Signed)
PATIENT HAS BEEN ACCEPTED TO [facility] FOR Tuesday 3/20 AND DRIVER WILL PICK HER UP AT 9:30am. Haley Brady IN PHARMACY MADE AWARE OF NEED FOR 21 DAY SUPPLY OF MEDICATIONS. Ricky Ala NP ALSO MADE AWARE OF EARLY DISCHARGE. MD LEFT FOR DAY-NOTE FOR HIM IN OFFICE.  Maxie Better, MSW, LCSW Clinical Social Worker 04/15/2016 3:32 PM

## 2016-04-16 MED ORDER — GABAPENTIN 400 MG PO CAPS: 800.0000 mg | ORAL_CAPSULE | Freq: Three times a day (TID) | ORAL | 0 refills | Status: DC

## 2016-04-16 MED ORDER — NICOTINE 21 MG/24HR TD PT24: 21.0000 mg | MEDICATED_PATCH | Freq: Every day | TRANSDERMAL | 0 refills | Status: DC

## 2016-04-16 MED ORDER — TRAZODONE HCL 100 MG PO TABS: 100.0000 mg | ORAL_TABLET | Freq: Every day | ORAL | 0 refills | Status: DC

## 2016-04-16 MED ORDER — VENLAFAXINE HCL ER 75 MG PO CP24: 75.0000 mg | ORAL_CAPSULE | Freq: Every day | ORAL | 0 refills | Status: DC

## 2016-04-16 MED ORDER — HYDROXYZINE HCL 25 MG PO TABS: 25.0000 mg | ORAL_TABLET | Freq: Four times a day (QID) | ORAL | 0 refills | Status: DC | PRN

## 2016-04-16 MED ORDER — PANTOPRAZOLE SODIUM 40 MG PO TBEC: 40.0000 mg | DELAYED_RELEASE_TABLET | Freq: Two times a day (BID) | ORAL | 0 refills | Status: DC

## 2016-04-16 MED ORDER — BUPROPION HCL ER (XL) 300 MG PO TB24: 300.0000 mg | ORAL_TABLET | Freq: Every day | ORAL | 0 refills | Status: DC

## 2016-04-16 NOTE — BHH Suicide Risk Assessment (Signed)
[facility] Discharge Suicide Risk Assessment   Principal Problem: Polysubstance dependence including opioid drug with daily use [facility]) Discharge Diagnoses:  Patient Active Problem List   Diagnosis Date Noted  . Polysubstance dependence including opioid drug with daily use ([place]) [F19.20] 04/12/2016  . MDD (major depressive disorder), recurrent severe, without psychosis ([place]) [F33.2] 04/11/2016  . Dyspepsia [R10.13] 08/26/2014  . Chronic nausea [R11.0] 05/28/2011  . URINALYSIS, ABNORMAL [R82.99] 08/06/2006  . MRSA INFECTION [B95.7] 05/23/2006  . OBESITY NOS [E66.9] 05/23/2006  . ANXIETY [F41.1] 05/23/2006  . MIGRAINE HEADACHE [G43.909] 05/23/2006  . GERD [K21.9] 05/23/2006  . LOW BACK PAIN, CHRONIC [M54.5] 05/23/2006    Total Time spent with patient: 20 minutes  Musculoskeletal: Strength & Muscle Tone: within normal limits Gait & Station: normal Patient leans: N/A  Psychiatric Specialty Exam: ROS  Blood pressure (!) 144/99, pulse 91, temperature 99.1 F (37.3 C), temperature source Oral, resp. rate 16, height 5' 3.5" (1.613 m), weight 108.7 kg (239 lb 12 oz), last menstrual period 03/11/2016.Body mass index is 41.8 kg/m.  General Appearance: Casual  Eye Contact::  Fair  Speech:  Clear and [id number]  Volume:  Normal  Mood:  Euthymic  Affect:  Appropriate  Thought Process:  Coherent  Orientation:  Full (Time, Place, and Person)  Thought Content:  Logical  Suicidal Thoughts:  No  Homicidal Thoughts:  No  Memory:  Immediate;   Fair  Judgement:  Intact  Insight:  Good  Psychomotor Activity:  Normal  Concentration:  Fair  Recall:  Good  Fund of Knowledge:Good  Language: Good  Akathisia:  No  Handed:  Right  AIMS (if indicated):     Assets:  Communication Skills Desire for Improvement Resilience  Sleep:  Number of Hours: 5.5  Cognition: WNL  ADL's:  Intact   Mental Status Per Nursing Assessment::   On Admission:  Self-harm behaviors  Demographic Factors:  Low  socioeconomic status  Loss Factors: Financial problems/change in socioeconomic status  Historical Factors: Impulsivity  Risk Reduction Factors:   Positive coping skills or problem solving skills  Continued Clinical Symptoms:  Alcohol/Substance Abuse/Dependencies  Cognitive Features That Contribute To Risk:  Thought constriction (tunnel vision)    Suicide Risk:  Minimal: No identifiable suicidal ideation.  Patients presenting with no risk factors but with morbid ruminations; may be classified as minimal risk based on the severity of the depressive symptoms  Follow-up Information    [facility] Follow up on 04/16/2016.   Why:  You have been accepted to [facility] for today. [facility] driver will pick you up directly from the hospital. Please make sure to have 21 day supply of medications. Thank you.  Contact information: [address] [place], [place] [address] Phone: [phone] Fax: [phone]          Plan Of Care/Follow-up recommendations:  Activity:  ad lib  Sharma Covert, MD 04/16/2016, 7:07 AM
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
## 2016-04-16 NOTE — Progress Notes (Signed)
D:  Patient's self inventory sheet, patient sleeps good, sleep medication helpful.  Fair appetite, low energy level, good concentration.  Rated depression 7, hopeless 2, anxiety 3.  Denied withdrawals.  Denied SI.  Physical problems none.  Denied pain.  Goal is to discharge to rehab.  Plans discharge. A:  Medications administered per MD orders.  Emotional support and encouragement given patient. R:  Denied SI and HI, contracts for safety.  Denied A/V hallucinations.  Safety maintained with 15 minute checks.

## 2016-04-16 NOTE — Progress Notes (Signed)
Discharge:  Patient discharged to [facility].  Patient denied SI and HI.  Denied A/V hallucinations.  Suicide prevention information given and discussed with patient who stated she understood and had no questions.  Patient stated she received all her belongings, clothing, toiletries, misc items, prescriptions, medications, etc. A:  Medications administered per MD orders.  Emotional support and encouragement given patient. R:  Denied SI and HI, Contracts for safety.  Denied A/V hallucinations.  Safety maintained with 15 minute checks.

## 2016-04-16 NOTE — Progress Notes (Signed)
D: Patient seen with visitors in her room. Patient identified the visitors as her parents. Denies pain, SI,HI, AH/VH at this time. Continues to endorse anxiety and verbalizes concern about finding a rehab center. No behavioral issues noted. A: Staff offered support and encouragement as needed. Due med given as ordered. Routine safety checks maintained. Will continue to monitor patient. R: Patient remains safe.

## 2016-06-22 ENCOUNTER — Encounter (HOSPITAL_COMMUNITY): Payer: Self-pay | Admitting: Emergency Medicine

## 2016-06-22 ENCOUNTER — Emergency Department (HOSPITAL_COMMUNITY)
Admission: EM | Admit: 2016-06-22 | Discharge: 2016-06-23 | Disposition: A | Discharge status: Other Acute Inpatient Hospital | Payer: Medicaid Other | Attending: Emergency Medicine | Admitting: Emergency Medicine

## 2016-06-22 DIAGNOSIS — F1490 Cocaine use, unspecified, uncomplicated: Secondary | ICD-10-CM | POA: Insufficient documentation

## 2016-06-22 DIAGNOSIS — F32A Depression, unspecified: Secondary | ICD-10-CM

## 2016-06-22 DIAGNOSIS — F418 Other specified anxiety disorders: Secondary | ICD-10-CM | POA: Diagnosis not present

## 2016-06-22 DIAGNOSIS — F1290 Cannabis use, unspecified, uncomplicated: Secondary | ICD-10-CM | POA: Insufficient documentation

## 2016-06-22 DIAGNOSIS — F17210 Nicotine dependence, cigarettes, uncomplicated: Secondary | ICD-10-CM | POA: Insufficient documentation

## 2016-06-22 DIAGNOSIS — F329 Major depressive disorder, single episode, unspecified: Secondary | ICD-10-CM | POA: Diagnosis present

## 2016-06-22 DIAGNOSIS — F419 Anxiety disorder, unspecified: Secondary | ICD-10-CM | POA: Diagnosis present

## 2016-06-22 DIAGNOSIS — Z79899 Other long term (current) drug therapy: Secondary | ICD-10-CM | POA: Diagnosis not present

## 2016-06-22 DIAGNOSIS — F3163 Bipolar disorder, current episode mixed, severe, without psychotic features: Secondary | ICD-10-CM | POA: Diagnosis present

## 2016-06-22 LAB — BASIC METABOLIC PANEL
ANION GAP: 7 (ref 5–15)
BUN: 15 mg/dL (ref 6–20)
CALCIUM: 9.1 mg/dL (ref 8.9–10.3)
CO2: 30 mmol/L (ref 22–32)
Chloride: 104 mmol/L (ref 101–111)
Creatinine, Ser: 0.83 mg/dL (ref 0.44–1.00)
GFR calc non Af Amer: >60 mL/min (ref >60)
Glucose, Bld: 116 mg/dL — ABNORMAL HIGH (ref 65–99)
Potassium: 4.1 mmol/L (ref 3.5–5.1)
SODIUM: 141 mmol/L (ref 135–145)

## 2016-06-22 LAB — CBC WITH DIFFERENTIAL/PLATELET
BASOS ABS: 0 10*3/uL (ref 0.0–0.1)
BASOS PCT: 1 %
EOS ABS: 0.3 10*3/uL (ref 0.0–0.7)
Eosinophils Relative: 3 %
HCT: 34.7 % — ABNORMAL LOW (ref 36.0–46.0)
HEMOGLOBIN: 11.4 g/dL — AB (ref 12.0–15.0)
Lymphocytes Relative: 30 %
Lymphs Abs: 2.4 10*3/uL (ref 0.7–4.0)
MCH: 29.2 pg (ref 26.0–34.0)
MCHC: 32.9 g/dL (ref 30.0–36.0)
MCV: 89 fL (ref 78.0–100.0)
MONOS PCT: 9 %
Monocytes Absolute: 0.8 10*3/uL (ref 0.1–1.0)
NEUTROS ABS: 4.6 10*3/uL (ref 1.7–7.7)
NEUTROS PCT: 57 %
Platelets: 312 10*3/uL (ref 150–400)
RBC: 3.9 MIL/uL (ref 3.87–5.11)
RDW: 13.6 % (ref 11.5–15.5)
WBC: 8.1 10*3/uL (ref 4.0–10.5)

## 2016-06-22 LAB — RAPID URINE DRUG SCREEN, HOSP PERFORMED
Amphetamines: POSITIVE — AB
BENZODIAZEPINES: NOT DETECTED
Barbiturates: NOT DETECTED
COCAINE: POSITIVE — AB
OPIATES: POSITIVE — AB
Tetrahydrocannabinol: POSITIVE — AB

## 2016-06-22 LAB — ETHANOL: Alcohol, Ethyl (B): <5 mg/dL (ref <5)

## 2016-06-22 MED ORDER — LORAZEPAM 1 MG PO TABS
2.0000 mg | ORAL_TABLET | Freq: Once | ORAL | Status: AC
  Administered 2016-06-22: 2 mg via ORAL
  Filled 2016-06-22: qty 2

## 2016-06-22 MED ORDER — IBUPROFEN 800 MG PO TABS
800.0000 mg | ORAL_TABLET | Freq: Once | ORAL | Status: AC
  Administered 2016-06-22: 800 mg via ORAL
  Filled 2016-06-22: qty 1

## 2016-06-22 NOTE — ED Provider Notes (Addendum)
[place] DEPT Provider Note   CSN: [id number] Arrival date & time: 06/22/16  1832     History   Chief Complaint Chief Complaint  Patient presents with  . V70.1    HPI Haley Brady is a 31 y.o. female.  Patient is depressed, anxious, fidgety, poor sleeping habits. She is currently homeless and is concerned that her daughter will be taken from her. She is presently on Wellbutrin and Effexor. She has multiple psychiatric diagnoses including depression, polysubstance abuse, panic disorder, OCD. Her Effexor dose was recently reduced. No suicidal or homicidal ideation.      Past Medical History:  Diagnosis Date  . Abnormal pap   . Asymptomatic varicose veins   . Chronic back pain   . Chronic nausea   . DDD (degenerative disc disease)   . Depression   . Esophageal reflux   . Ingrowing nail   . Migraines   . Panic disorder without agoraphobia   . Renal disorder   . Vaginal Pap smear, abnormal     Patient Active Problem List   Diagnosis Date Noted  . Polysubstance dependence including opioid drug with daily use ([place]) 04/12/2016  . MDD (major depressive disorder), recurrent severe, without psychosis ([place]) 04/11/2016  . Dyspepsia 08/26/2014  . Chronic nausea 05/28/2011  . URINALYSIS, ABNORMAL 08/06/2006  . MRSA INFECTION 05/23/2006  . OBESITY NOS 05/23/2006  . ANXIETY 05/23/2006  . MIGRAINE HEADACHE 05/23/2006  . GERD 05/23/2006  . LOW BACK PAIN, CHRONIC 05/23/2006    Past Surgical History:  Procedure Laterality Date  . BRAVO [place] STUDY  05/2011   on nexium 40mg  daily 217 episodes of reflux, Demeester score day 1: 51.9 and Day 2: 24.8.   Marland Kitchen CHOLECYSTECTOMY    . ESOPHAGOGASTRODUODENOSCOPY  06/11/2011    SMALL HIATAL HERNIA/Moderate gastritis/  BURNING CHESTPAIN: GERD, NERD, OR NON-ULCER DYSPEPSIA/ Nl esophagus  . TONSILLECTOMY      OB History    Gravida Para Term Preterm AB Living   2 1       1    SAB TAB Ectopic Multiple Live Births                    Home Medications    Prior to Admission medications   Medication Sig Start Date End Date Taking? Authorizing Provider  buPROPion (WELLBUTRIN XL) 300 MG 24 hr tablet Take 1 tablet (300 mg total) by mouth daily. 04/17/16   Kerrie Buffalo, NP  gabapentin (NEURONTIN) 400 MG capsule Take 2 capsules (800 mg total) by mouth 3 (three) times daily. 04/16/16   Kerrie Buffalo, NP  hydrOXYzine (ATARAX/VISTARIL) 25 MG tablet Take 1 tablet (25 mg total) by mouth every 6 (six) hours as needed for anxiety. 04/16/16   Kerrie Buffalo, NP  nicotine (NICODERM CQ - DOSED IN MG/24 HOURS) 21 mg/24hr patch Place 1 patch (21 mg total) onto the skin daily. 04/17/16   Kerrie Buffalo, NP  pantoprazole (PROTONIX) 40 MG tablet Take 1 tablet (40 mg total) by mouth 2 (two) times daily. 04/16/16   Kerrie Buffalo, NP  traZODone (DESYREL) 100 MG tablet Take 1 tablet (100 mg total) by mouth at bedtime. 04/16/16   Kerrie Buffalo, NP  venlafaxine XR (EFFEXOR-XR) 75 MG 24 hr capsule Take 1 capsule (75 mg total) by mouth daily. 04/17/16   Kerrie Buffalo, NP    Family History Family History  Problem Relation Age of Onset  . Pancreatic cancer Father 70    Social History Social History  Substance Use  Topics  . Smoking status: Heavy Tobacco Smoker    Packs/day: 0.50    Types: Cigarettes  . Smokeless tobacco: Never Used     Comment: pt reports '4 cigarettes per day."  . Alcohol use Yes     Comment: occasionally     Allergies   Patient has no known allergies.   Review of Systems Review of Systems  All other systems reviewed and are negative.    Physical Exam Updated Vital Signs BP (!) 150/84 (BP Location: Right Arm)   Pulse 94   Temp 98.6 F (37 C) (Oral)   Resp 20   Ht 5\' 4"  (1.626 m)   Wt 117.9 kg (260 lb)   LMP 06/20/2016   SpO2 96%   BMI 44.63 kg/m   Physical Exam  Constitutional: She is oriented to person, place, and time.  Anxious, obese  HENT:  Head: Normocephalic and atraumatic.  Eyes:  Conjunctivae are normal.  Neck: Neck supple.  Cardiovascular: Normal rate and regular rhythm.   Pulmonary/Chest: Effort normal and breath sounds normal.  Abdominal: Soft. Bowel sounds are normal.  Musculoskeletal: Normal range of motion.  Neurological: She is alert and oriented to person, place, and time.  Skin: Skin is warm and dry.  Psychiatric:  Anxious, depressed  Nursing note and vitals reviewed.    ED Treatments / Results  Labs (all labs ordered are listed, but only abnormal results are displayed) Labs Reviewed  CBC WITH DIFFERENTIAL/PLATELET  BASIC METABOLIC PANEL  RAPID URINE DRUG SCREEN, HOSP PERFORMED  ETHANOL    EKG  EKG Interpretation None       Radiology No results found.  Procedures Procedures (including critical care time)  Medications Ordered in ED Medications  LORazepam (ATIVAN) tablet 2 mg (not administered)  ibuprofen (ADVIL,MOTRIN) tablet 800 mg (not administered)     Initial Impression / Assessment and Plan / ED Course  I have reviewed the triage vital signs and the nursing notes.  Pertinent labs & imaging results that were available during my care of the patient were reviewed by me and considered in my medical decision making (see chart for details).     Patient presents with anxiety and depression. We will obtain behavioral health consult.  2000: TTS consult obtained. Patient will be reevaluated in the morning.  Final Clinical Impressions(s) / ED Diagnoses   Final diagnoses:  Anxiety  Depression, unspecified depression type    New Prescriptions New Prescriptions   No medications on file     Nat Christen, MD 06/22/16 Docia Chuck    Nat Christen, MD 06/22/16 2142

## 2016-06-22 NOTE — BH Assessment (Addendum)
Tele Assessment Note   Haley Brady is an 31 y.o. single female who presents unaccompanied to [facility] ED reporting symptoms of anxiety, depression and racing thoughts. Pt reports she has a history of anxiety, depression and OCD and says she is compliant with psychiatric medications (see MAR). Pt also has a history of polysubstance abuse and says she relapsed on cocaine, methamphetamines and cannabis three days ago. Pt reports symptoms including crying spells, social withdrawal, loss of interest in usual pleasures, fatigue, irritability, decreased concentration, decreased sleep, decreased appetite and feelings of guilt and hopelessness. Pt says she feels "completely worthless." She says she cannot focus and "I can't complete a sentence." Pt reports she is experiencing panic attacks every other day. She denies current suicidal ideation or history of suicide attempts. She denies any history of intentional self-injurious behaviors. She denies current homicidal ideation or history of violence. She denies auditory or visual hallucinations.  Pt says she is currently homeless. She was discharged from a transitional care substance abuse center three days ago and immediately relapsed. She is unemployed. She is concerned that DSS will take away her parental rights of her ten-year-old daughter, who is current being care for by child's father. Pt denies any history of abuse or trauma. Pt denies any legal problems. Pt says her mother, stepfather and sisters are supportive of Pt receiving treatment but will not give her money or allow her to stay with them.  Pt says she has no current outpatient mental health providers. She was inpatient at [facility] in March 2018. Pt also says she spent 73 days in a substance abuse treatment program called [facility].  Pt is casually dressed, alert, oriented x4 with normal speech and restless motor behavior. Pt appears easily distracted. Eye contact is good. Pt's mood is depressed  and anxious; affect is congruent with mood. Thought process is coherent and relevant. There is no indication Pt is currently responding to internal stimuli or experiencing delusional thought content. Pt was cooperative throughout assessment. Pt says she is willing to sign voluntarily into a facility.   Diagnosis: Major Depressive Disorder, Recurrent, Moderate; Panic Disorder Without Agoraphobia; Cocaine Use Disorder; Methamphetamine Use Disorder; Cannabis Use Disorder  Past Medical History:  Past Medical History:  Diagnosis Date  . Abnormal pap   . Asymptomatic varicose veins   . Chronic back pain   . Chronic nausea   . DDD (degenerative disc disease)   . Depression   . Esophageal reflux   . Ingrowing nail   . Migraines   . Panic disorder without agoraphobia   . Renal disorder   . Vaginal Pap smear, abnormal     Past Surgical History:  Procedure Laterality Date  . BRAVO [place] STUDY  05/2011   on nexium 40mg  daily 217 episodes of reflux, Demeester score day 1: 51.9 and Day 2: 24.8.   [facility] CHOLECYSTECTOMY    . ESOPHAGOGASTRODUODENOSCOPY  06/11/2011    SMALL HIATAL HERNIA/Moderate gastritis/  BURNING CHESTPAIN: GERD, NERD, OR NON-ULCER DYSPEPSIA/ Nl esophagus  . TONSILLECTOMY      Family History:  Family History  Problem Relation Age of Onset  . Pancreatic cancer Father 70    Social History:  reports that she has been smoking Cigarettes.  She has been smoking about 0.50 packs per day. She has never used smokeless tobacco. She reports that she drinks alcohol. She reports that she uses drugs, including Cocaine, Marijuana, and Methamphetamines.  Additional Social History:  Alcohol / Drug Use Pain Medications:  See MAR Prescriptions: See MAR Over the Counter: See MAR History of alcohol / drug use?: Yes Longest period of sobriety (when/how long): Two and a half years Negative Consequences of Use: Financial, Personal relationships, Work / School Substance #1 Name of Substance 1: Cocaine  (smoking and IV) 1 - Age of First Use: 20's 1 - Amount (size/oz): Up two two grams daily 1 - Frequency: Relapsed three days ago 1 - Duration: Ongoing 1 - Last Use / Amount: 06/21/16 Substance #2 Name of Substance 2: Marijuana 2 - Age of First Use: 12 2 - Amount (size/oz): About a dimebag a day 2 - Frequency: Daily use 2 - Duration: on-going 2 - Last Use / Amount: 06/21/16 Substance #3 Name of Substance 3: Crystal Methamphetamine 3 - Age of First Use: 28 3 - Amount (size/oz): "a bump" 3 - Frequency: Infrequent 3 - Duration: Ongoing 3 - Last Use / Amount: 06/21/16 Substance #4 Name of Substance 4: Opiates (heroin, pain pills) 4 - Age of First Use: 20's 4 - Amount (size/oz): Varies 4 - Frequency: Daily when using 4 - Duration: Has used on and off for years 4 - Last Use / Amount: Six months ago  CIWA: CIWA-Ar BP: (!) 150/84 Pulse Rate: 94 COWS:    PATIENT STRENGTHS: (choose at least two) Ability for insight Average or above average intelligence Capable of independent living Communication skills General fund of knowledge Motivation for treatment/growth Physical Health  Allergies: No Known Allergies  Home Medications:  (Not in a hospital admission)  OB/GYN Status:  Patient's last menstrual period was 06/20/2016.  General Assessment Data Location of Assessment: [facility] TTS Assessment: In system Is this a Tele or Face-to-Face Assessment?: Tele Assessment Is this an Initial Assessment or a Re-assessment for this encounter?: Initial Assessment Marital status: Single Maiden name: NA Is patient pregnant?: No Pregnancy Status: No Living Arrangements: Other (Comment) (Homeless) Can pt return to current living arrangement?: Yes Admission Status: Voluntary Is patient capable of signing voluntary admission?: Yes Referral Source: Self/Family/Friend Insurance type: Medicaid     Crisis Care Plan Living Arrangements: Other (Comment) (Homeless) Legal Guardian: Other:  (Self) Name of Psychiatrist: None Name of Therapist: None  Education Status Is patient currently in school?: No Current Grade: NA 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 : No Suicidal Intent: No Has patient had any suicidal intent within the past 6 months prior to admission? : No Is patient at risk for suicide?: No Suicidal Plan?: No Has patient had any suicidal plan within the past 6 months prior to admission? : No Access to Means: No What has been your use of drugs/alcohol within the last 12 months?: Pt reports using cocaine, meth, marijuana Previous Attempts/Gestures: No How many times?: 0 Other Self Harm Risks: None Triggers for Past Attempts: None known Intentional Self Injurious Behavior: None Family Suicide History: No Recent stressful life event(s): Financial Problems, Other (Comment) (Homeless) Persecutory voices/beliefs?: No Depression: Yes Depression Symptoms: Despondent, Tearfulness, Isolating, Fatigue, Guilt, Loss of interest in usual pleasures, Feeling worthless/self pity, Feeling angry/irritable Substance abuse history and/or treatment for substance abuse?: Yes Suicide prevention information given to non-admitted patients: Not applicable  Risk to Others within the past 6 months Homicidal Ideation: No Does patient have any lifetime risk of violence toward others beyond the six months  prior to admission? : No Thoughts of Harm to Others: No Current Homicidal Intent: No Current Homicidal Plan: No Access to Homicidal Means: No Identified Victim: None History of harm to others?: No Assessment of Violence: None Noted Violent Behavior Description: Pt denies history of violence Does patient have access to weapons?: No Criminal Charges Pending?: No Does patient have a court date:  No Is patient on probation?: No  Psychosis Hallucinations: None noted Delusions: None noted  Mental Status Report Appearance/Hygiene: Other (Comment) (Casually dressed) Eye Contact: Good Motor Activity: Unremarkable Speech: Logical/coherent Level of Consciousness: Alert Mood: Depressed, Anxious Affect: Depressed, Anxious Anxiety Level: Panic Attacks Panic attack frequency: 3-4 times per week Most recent panic attack: Today Thought Processes: Coherent, Relevant Judgement: Unimpaired Orientation: Person, Place, Time, Situation, Appropriate for developmental age Obsessive Compulsive Thoughts/Behaviors: None  Cognitive Functioning Concentration: Decreased Memory: Recent Intact, Remote Intact IQ: Average Insight: Fair Impulse Control: Fair Appetite: Fair Weight Loss: 0 Weight Gain: 0 Sleep: No Change Total Hours of Sleep: 8 Vegetative Symptoms: None  ADLScreening [facility] Assessment Services) Patient's cognitive ability adequate to safely complete daily activities?: Yes Patient able to express need for assistance with ADLs?: Yes Independently performs ADLs?: Yes (appropriate for developmental age)  Prior Inpatient Therapy Prior Inpatient Therapy: Yes Prior Therapy Dates: 03/2016 Prior Therapy Facilty/Provider(s): [facility] Reason for Treatment: Depression, anxiety, substance abuse  Prior Outpatient Therapy Prior Outpatient Therapy: No Prior Therapy Dates: NA Prior Therapy Facilty/Provider(s): NA Reason for Treatment: NA Does patient have an ACCT team?: No Does patient have Intensive In-House Services?  : No Does patient have [facility] services? : No Does patient have P4CC services?: No  ADL Screening (condition at time of admission) Patient's cognitive ability adequate to safely complete daily activities?: Yes Is the patient deaf or have difficulty hearing?: No Does the patient have difficulty seeing, even when wearing glasses/contacts?: No Does the patient have  difficulty concentrating, remembering, or making decisions?: No Patient able to express need for assistance with ADLs?: Yes Does the patient have difficulty dressing or bathing?: No Independently performs ADLs?: Yes (appropriate for developmental age) Does the patient have difficulty walking or climbing stairs?: No Weakness of Legs: None Weakness of Arms/Hands: None       Abuse/Neglect Assessment (Assessment to be complete while patient is alone) Physical Abuse: Denies Verbal Abuse: Denies Sexual Abuse: Denies Exploitation of patient/patient's resources: Denies Self-Neglect: Denies     Regulatory affairs officer (For Healthcare) Does Patient Have a Medical Advance Directive?: No Would patient like information on creating a medical advance directive?: No - Patient declined    Additional Information 1:1 In Past 12 Months?: No CIRT Risk: No Elopement Risk: No Does patient have medical clearance?: Yes     Disposition: Lavell Luster, AC at [facility], confirms adult and observation units are at capacity. Gave clinical report to Lindon Romp, NP who recommended Pt be observed in ED overnight and evaluated by psychiatry in the morning. Notified Dr. Nat Christen and J.J., RN of recommendation.  Disposition Initial Assessment Completed for this Encounter: Yes Disposition of Patient: Other dispositions Other disposition(s): Other (Comment)   Evelena Peat, [facility], [facility], [facility] Triage Specialist [phone]   Anson Fret, Orpah Greek 06/22/2016 8:06 PM

## 2016-06-22 NOTE — ED Triage Notes (Signed)
Per patient she states that she is severely depressed, racing thoughts, and anxious.  She states that she is homeless and her daughter is going to be taken from her.  She states, "I have not been able to sit still and my back is hurting really bad."  She is also having trouble sleeping.  Pt has been having these symptoms for the past week.  Pt states that she is on OCD and antidepressants medications and her OCD medication dosage was recently lowered.

## 2016-06-23 ENCOUNTER — Encounter (HOSPITAL_COMMUNITY): Payer: Self-pay | Admitting: *Deleted

## 2016-06-23 ENCOUNTER — Inpatient Hospital Stay (HOSPITAL_COMMUNITY)
Admission: AD | Admit: 2016-06-23 | Discharge: 2016-07-01 | DRG: 885 | Disposition: A | Discharge status: Home / Self Care | Payer: Medicaid Other | Source: Intra-hospital | Attending: Psychiatry | Admitting: Psychiatry

## 2016-06-23 DIAGNOSIS — Z9049 Acquired absence of other specified parts of digestive tract: Secondary | ICD-10-CM

## 2016-06-23 DIAGNOSIS — Z8614 Personal history of Methicillin resistant Staphylococcus aureus infection: Secondary | ICD-10-CM

## 2016-06-23 DIAGNOSIS — F1510 Other stimulant abuse, uncomplicated: Secondary | ICD-10-CM | POA: Diagnosis present

## 2016-06-23 DIAGNOSIS — Z560 Unemployment, unspecified: Secondary | ICD-10-CM

## 2016-06-23 DIAGNOSIS — E669 Obesity, unspecified: Secondary | ICD-10-CM | POA: Diagnosis not present

## 2016-06-23 DIAGNOSIS — I8390 Asymptomatic varicose veins of unspecified lower extremity: Secondary | ICD-10-CM | POA: Diagnosis present

## 2016-06-23 DIAGNOSIS — Z800 Family history of malignant neoplasm of digestive organs: Secondary | ICD-10-CM | POA: Diagnosis not present

## 2016-06-23 DIAGNOSIS — M545 Low back pain: Secondary | ICD-10-CM | POA: Diagnosis present

## 2016-06-23 DIAGNOSIS — F1920 Other psychoactive substance dependence, uncomplicated: Secondary | ICD-10-CM | POA: Diagnosis present

## 2016-06-23 DIAGNOSIS — F17210 Nicotine dependence, cigarettes, uncomplicated: Secondary | ICD-10-CM | POA: Diagnosis present

## 2016-06-23 DIAGNOSIS — F411 Generalized anxiety disorder: Secondary | ICD-10-CM | POA: Diagnosis not present

## 2016-06-23 DIAGNOSIS — F1120 Opioid dependence, uncomplicated: Secondary | ICD-10-CM | POA: Diagnosis present

## 2016-06-23 DIAGNOSIS — F329 Major depressive disorder, single episode, unspecified: Secondary | ICD-10-CM | POA: Insufficient documentation

## 2016-06-23 DIAGNOSIS — F1210 Cannabis abuse, uncomplicated: Secondary | ICD-10-CM | POA: Diagnosis not present

## 2016-06-23 DIAGNOSIS — Z79899 Other long term (current) drug therapy: Secondary | ICD-10-CM

## 2016-06-23 DIAGNOSIS — F1420 Cocaine dependence, uncomplicated: Secondary | ICD-10-CM | POA: Diagnosis present

## 2016-06-23 DIAGNOSIS — F419 Anxiety disorder, unspecified: Secondary | ICD-10-CM | POA: Diagnosis not present

## 2016-06-23 DIAGNOSIS — F3163 Bipolar disorder, current episode mixed, severe, without psychotic features: Secondary | ICD-10-CM | POA: Diagnosis present

## 2016-06-23 DIAGNOSIS — F1410 Cocaine abuse, uncomplicated: Secondary | ICD-10-CM | POA: Diagnosis not present

## 2016-06-23 DIAGNOSIS — F429 Obsessive-compulsive disorder, unspecified: Secondary | ICD-10-CM | POA: Diagnosis present

## 2016-06-23 DIAGNOSIS — Z6841 Body Mass Index (BMI) 40.0 and over, adult: Secondary | ICD-10-CM

## 2016-06-23 DIAGNOSIS — F331 Major depressive disorder, recurrent, moderate: Secondary | ICD-10-CM | POA: Diagnosis not present

## 2016-06-23 DIAGNOSIS — G4700 Insomnia, unspecified: Secondary | ICD-10-CM | POA: Diagnosis present

## 2016-06-23 DIAGNOSIS — F332 Major depressive disorder, recurrent severe without psychotic features: Secondary | ICD-10-CM | POA: Diagnosis present

## 2016-06-23 DIAGNOSIS — K219 Gastro-esophageal reflux disease without esophagitis: Secondary | ICD-10-CM | POA: Diagnosis present

## 2016-06-23 DIAGNOSIS — Z8249 Family history of ischemic heart disease and other diseases of the circulatory system: Secondary | ICD-10-CM

## 2016-06-23 DIAGNOSIS — F1220 Cannabis dependence, uncomplicated: Secondary | ICD-10-CM | POA: Diagnosis present

## 2016-06-23 DIAGNOSIS — G8929 Other chronic pain: Secondary | ICD-10-CM | POA: Diagnosis present

## 2016-06-23 DIAGNOSIS — F410 Panic disorder [episodic paroxysmal anxiety] without agoraphobia: Secondary | ICD-10-CM | POA: Diagnosis present

## 2016-06-23 DIAGNOSIS — F39 Unspecified mood [affective] disorder: Secondary | ICD-10-CM | POA: Diagnosis not present

## 2016-06-23 DIAGNOSIS — F32A Depression, unspecified: Secondary | ICD-10-CM | POA: Insufficient documentation

## 2016-06-23 DIAGNOSIS — Z590 Homelessness: Secondary | ICD-10-CM | POA: Diagnosis not present

## 2016-06-23 MED ORDER — CLONIDINE HCL 0.1 MG PO TABS
0.1000 mg | ORAL_TABLET | Freq: Four times a day (QID) | ORAL | Status: AC
  Administered 2016-06-24 – 2016-06-26 (×9): 0.1 mg via ORAL
  Filled 2016-06-23 (×10): qty 1

## 2016-06-23 MED ORDER — TRAZODONE HCL 100 MG PO TABS
100.0000 mg | ORAL_TABLET | Freq: Every day | ORAL | Status: DC
  Administered 2016-06-23: 100 mg via ORAL
  Filled 2016-06-23 (×4): qty 1

## 2016-06-23 MED ORDER — NAPROXEN 500 MG PO TABS
500.0000 mg | ORAL_TABLET | Freq: Two times a day (BID) | ORAL | Status: AC | PRN
  Administered 2016-06-23 – 2016-06-28 (×5): 500 mg via ORAL
  Filled 2016-06-23 (×5): qty 1

## 2016-06-23 MED ORDER — GABAPENTIN 400 MG PO CAPS
800.0000 mg | ORAL_CAPSULE | Freq: Three times a day (TID) | ORAL | Status: DC
  Administered 2016-06-24 – 2016-06-26 (×8): 800 mg via ORAL
  Filled 2016-06-23 (×13): qty 2

## 2016-06-23 MED ORDER — NICOTINE 21 MG/24HR TD PT24
21.0000 mg | MEDICATED_PATCH | Freq: Every day | TRANSDERMAL | Status: DC
  Administered 2016-06-28 – 2016-07-01 (×3): 21 mg via TRANSDERMAL
  Filled 2016-06-23 (×9): qty 1

## 2016-06-23 MED ORDER — METHOCARBAMOL 500 MG PO TABS
500.0000 mg | ORAL_TABLET | Freq: Three times a day (TID) | ORAL | Status: AC | PRN
  Administered 2016-06-28: 500 mg via ORAL
  Filled 2016-06-23: qty 1

## 2016-06-23 MED ORDER — MAGNESIUM HYDROXIDE 400 MG/5ML PO SUSP: 30.0000 mL | Freq: Every day | ORAL | Status: DC | PRN

## 2016-06-23 MED ORDER — CLONIDINE HCL 0.1 MG PO TABS
0.1000 mg | ORAL_TABLET | ORAL | Status: AC
  Administered 2016-06-26 – 2016-06-28 (×4): 0.1 mg via ORAL
  Filled 2016-06-23 (×4): qty 1

## 2016-06-23 MED ORDER — DICYCLOMINE HCL 20 MG PO TABS: 20.0000 mg | ORAL_TABLET | Freq: Four times a day (QID) | ORAL | Status: AC | PRN

## 2016-06-23 MED ORDER — ALUM & MAG HYDROXIDE-SIMETH 200-200-20 MG/5ML PO SUSP: 30.0000 mL | ORAL | Status: DC | PRN

## 2016-06-23 MED ORDER — CLONIDINE HCL 0.1 MG PO TABS
0.1000 mg | ORAL_TABLET | Freq: Every day | ORAL | Status: AC
  Administered 2016-06-29 – 2016-06-30 (×2): 0.1 mg via ORAL
  Filled 2016-06-23 (×2): qty 1

## 2016-06-23 MED ORDER — PANTOPRAZOLE SODIUM 40 MG PO TBEC
40.0000 mg | DELAYED_RELEASE_TABLET | Freq: Two times a day (BID) | ORAL | Status: DC
  Administered 2016-06-24 – 2016-07-01 (×15): 40 mg via ORAL
  Filled 2016-06-23 (×21): qty 1

## 2016-06-23 MED ORDER — LOPERAMIDE HCL 2 MG PO CAPS: 2.0000 mg | ORAL_CAPSULE | ORAL | Status: AC | PRN

## 2016-06-23 MED ORDER — ONDANSETRON 4 MG PO TBDP: 4.0000 mg | ORAL_TABLET | Freq: Four times a day (QID) | ORAL | Status: AC | PRN

## 2016-06-23 MED ORDER — ACETAMINOPHEN 325 MG PO TABS
650.0000 mg | ORAL_TABLET | Freq: Four times a day (QID) | ORAL | Status: DC | PRN
  Administered 2016-06-29: 650 mg via ORAL
  Filled 2016-06-23: qty 2

## 2016-06-23 MED ORDER — VENLAFAXINE HCL ER 75 MG PO CP24
75.0000 mg | ORAL_CAPSULE | Freq: Every day | ORAL | Status: DC
  Administered 2016-06-24: 75 mg via ORAL
  Filled 2016-06-23 (×3): qty 1

## 2016-06-23 MED ORDER — HYDROXYZINE HCL 25 MG PO TABS
25.0000 mg | ORAL_TABLET | Freq: Four times a day (QID) | ORAL | Status: DC | PRN
  Administered 2016-06-23 – 2016-06-30 (×5): 25 mg via ORAL
  Filled 2016-06-23 (×5): qty 1

## 2016-06-23 MED ORDER — BUPROPION HCL ER (XL) 300 MG PO TB24
300.0000 mg | ORAL_TABLET | Freq: Every day | ORAL | Status: DC
  Administered 2016-06-24: 300 mg via ORAL
  Filled 2016-06-23 (×3): qty 1

## 2016-06-23 MED ORDER — GABAPENTIN 400 MG PO CAPS
800.0000 mg | ORAL_CAPSULE | Freq: Once | ORAL | Status: AC
  Administered 2016-06-23: 800 mg via ORAL
  Filled 2016-06-23 (×2): qty 2

## 2016-06-23 NOTE — ED Provider Notes (Signed)
Pt accepted to [facility]. Will transfer stable.    Francine Graven, DO 06/23/16 1806

## 2016-06-23 NOTE — ED Notes (Signed)
[facility] recommends inpatient treatment.  Bed available at [facility] room 504-1 after 2030 today.

## 2016-06-23 NOTE — ED Notes (Signed)
[facility] notified to pick up pt. at 2000 for transport to [facility].

## 2016-06-23 NOTE — Progress Notes (Signed)
31 year old female pt admitted on voluntary basis. Haley Brady reports that she recently got discharged from [facility] on Thursday and relapsed on meth, cocaine and hydrocodone. She reports that she was experiencing increased anxiety and depression while there and reports that she was taking her medications as prescribed. She reports wanting help for her depression and anxiety and reports she would like to go to an [facility] when she gets discharged. She denies any SI and is able to contract for safety while on the unit. She was oriented to the unit and safety maintained.

## 2016-06-23 NOTE — ED Notes (Signed)
Pt belongings at nursing desk.  Pt in paper scrubs. Pt is calm, cooperative and expresses relief about transferring to [facility] for therapy.

## 2016-06-23 NOTE — Progress Notes (Signed)
Patient was accepted at [facility], to Dr. Parke Poisson, room 504-bed1, ETA 20:30. [facility] RN Corene Cornea was notified.   Verlon Setting, [place] Disposition staff 06/23/2016 1:39 PM

## 2016-06-23 NOTE — Consult Note (Signed)
Telepsych Consultation   Reason for Consult:  Suicidal ideation Referring Physician:  EDP Patient Identification: Haley Brady MRN:  [id number] Principal Diagnosis: Bipolar 1 disorder, mixed, severe ([place]) Diagnosis:   Patient Active Problem List   Diagnosis Date Noted  . Bipolar 1 disorder, mixed, severe ([place]) [F31.63] 06/23/2016    Priority: High  . Anxiety [F41.9] 05/23/2006    Priority: High  . Depression [F32.9]   . Polysubstance dependence including opioid drug with daily use ([place]) [F19.20] 04/12/2016  . MDD (major depressive disorder), recurrent severe, without psychosis ([place]) [F33.2] 04/11/2016  . Dyspepsia [R10.13] 08/26/2014  . Chronic nausea [R11.0] 05/28/2011  . URINALYSIS, ABNORMAL [R82.99] 08/06/2006  . MRSA INFECTION [B95.7] 05/23/2006  . OBESITY NOS [E66.9] 05/23/2006  . MIGRAINE HEADACHE [G43.909] 05/23/2006  . GERD [K21.9] 05/23/2006  . LOW BACK PAIN, CHRONIC [M54.5] 05/23/2006    Total Time spent with patient: 30 minutes  Subjective:   Haley Brady is a 31 y.o. female patient admitted with depression, racing thoughts and anxiety.  HPI:  Per tele assessment note written by Rico Sheehan, [facility] Counselor:  Haley Brady is an 31 y.o. single female who presents unaccompanied to [facility] ED reporting symptoms of anxiety, depression and racing thoughts. Pt reports she has a history of anxiety, depression and OCD and says she is compliant with psychiatric medications (see MAR). Pt also has a history of polysubstance abuse and says she relapsed on cocaine, methamphetamines and cannabis three days ago. Pt reports symptoms including crying spells, social withdrawal, loss of interest in usual pleasures, fatigue, irritability, decreased concentration, decreased sleep, decreased appetite and feelings of guilt and hopelessness. Pt says she feels "completely worthless." She says she cannot focus and "I can't complete a sentence." Pt reports she is experiencing panic  attacks every other day. She denies current suicidal ideation or history of suicide attempts. She denies any history of intentional self-injurious behaviors. She denies current homicidal ideation or history of violence. She denies auditory or visual hallucinations.  Pt says she is currently homeless. She was discharged from a transitional care substance abuse center three days ago and immediately relapsed. She is unemployed. She is concerned that DSS will take away her parental rights of her ten-year-old daughter, who is current being care for by child's father. Pt denies any history of abuse or trauma. Pt denies any legal problems. Pt says her mother, stepfather and sisters are supportive of Pt receiving treatment but will not give her money or allow her to stay with them.  Pt says she has no current outpatient mental health providers. She was inpatient at [facility] in March 2018. Pt also says she spent 73 days in a substance abuse treatment program called [facility].  Pt is casually dressed, alert, oriented x4 with normal speech and restless motor behavior. Pt appears easily distracted. Eye contact is good. Pt's mood is depressed and anxious; affect is congruent with mood. Thought process is coherent and relevant. There is no indication Pt is currently responding to internal stimuli or experiencing delusional thought content. Pt was cooperative throughout assessment. Pt says she is willing to sign voluntarily into a facility.   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 Pt was groggy but cooperative, slurred speech but able to answer  questions appropriately, and oriented to person and place. Pt did not appear to be responding to internal stimuli and denied homicidal ideation and auditory and visual hallucinations. Pt endorsed wanting "it to all be over" as the reason she relapsed after being discharged three days ago after a 73 day stay at [facility] treatment facility. Pt stated she lives with her mother and there are a lot of stressors in her mother's house and she is also worried that DSS will take away her parental rights of her 11 year old daughter. Pt's daughter is being cared for by her biological father. Pt endorsed poor sleep over the past several nights, sleeping 3-4 hours intermittently. Pt is sad, depressed, hopeless and feels worthless. Pt would benefit from inpatient crisis stabilization and possible transition to a long term treatment facility for her addiction.   Past Psychiatric History: BiPolar Disorder, MDD, Anxiety, Polysubstance abuse disorder,   Risk to Self: Suicidal Ideation: No Suicidal Intent: No Is patient at risk for suicide?: No Suicidal Plan?: No Access to Means: No What has been your use of drugs/alcohol within the last 12 months?: Pt reports using cocaine, meth, marijuana How many times?: 0 Other Self Harm Risks: None Triggers for Past Attempts: None known Intentional Self Injurious Behavior: None Risk to Others: Homicidal Ideation: No Thoughts of Harm to Others: No Current Homicidal Intent: No Current Homicidal Plan: No Access to Homicidal Means: No Identified Victim: None History of harm to others?: No Assessment of Violence: None Noted Violent Behavior Description: Pt denies history of violence Does patient have access to weapons?: No Criminal Charges Pending?: No Does patient have a court date: No Prior Inpatient Therapy: Prior Inpatient Therapy: Yes Prior Therapy Dates: 03/2016 Prior Therapy Facilty/Provider(s): [facility] Reason for Treatment: Depression, anxiety, substance  abuse Prior Outpatient Therapy: Prior Outpatient Therapy: No Prior Therapy Dates: NA Prior Therapy Facilty/Provider(s): NA Reason for Treatment: NA Does patient have an ACCT team?: No Does patient have Intensive In-House Services?  : No Does patient have [facility] services? : No Does patient have P4CC services?: No  Past Medical History:  Past Medical History:  Diagnosis Date  . Abnormal pap   . Asymptomatic varicose veins   . Chronic back pain   . Chronic nausea   . DDD (degenerative disc disease)   . Depression   . Esophageal reflux   . Ingrowing nail   . Migraines   . Panic disorder without agoraphobia   . Renal disorder   . Vaginal Pap smear, abnormal     Past Surgical History:  Procedure Laterality Date  . BRAVO [place] STUDY  05/2011   on nexium 30m daily 217 episodes of reflux, Demeester score day 1: 51.9 and Day 2: 24.8.   .Mar[place] KitchenCHOLECYSTECTOMY    . ESOPHAGOGASTRODUODENOSCOPY  06/11/2011    SMALL HIATAL HERNIA/Moderate gastritis/  BURNING CHESTPAIN: GERD, NERD, OR NON-ULCER DYSPEPSIA/ Nl esophagus  . TONSILLECTOMY     Family History:  Family History  Problem Relation Age of Onset  . Pancreatic cancer Father 558  Family Psychiatric  History: Unknown Social History:  History  Alcohol Use  . Yes    Comment: occasionally     History  Drug Use  . Types: Cocaine, Marijuana, Methamphetamines    Social History   Social History  . Marital status: Single    Spouse name: N/A  . Number of children: 1  .  Years of education: N/A   Occupational History  . [employer]   Social History Main Topics  . Smoking status: Heavy Tobacco Smoker    Packs/day: 0.50    Types: Cigarettes  . Smokeless tobacco: Never Used     Comment: pt reports '4 cigarettes per day."  . Alcohol use Yes     Comment: occasionally  . Drug use: Yes    Types: Cocaine, Marijuana, Methamphetamines  . Sexual activity: Yes    Birth control/ protection: None   Other Topics Concern   . None   Social History Narrative   Lives w/ daughter-5   Additional Social History:    Allergies:  No Known Allergies  Labs:  Results for orders placed or performed during the hospital encounter of 06/22/16 (from the past 48 hour(s))  Urine rapid drug screen (hosp performed)     Status: Abnormal   Collection Time: 06/22/16  8:00 PM  Result Value Ref Range   Opiates POSITIVE (A) NONE DETECTED   Cocaine POSITIVE (A) NONE DETECTED   Benzodiazepines NONE DETECTED NONE DETECTED   Amphetamines POSITIVE (A) NONE DETECTED   Tetrahydrocannabinol POSITIVE (A) NONE DETECTED   Barbiturates NONE DETECTED NONE DETECTED    Comment:        DRUG SCREEN FOR MEDICAL PURPOSES ONLY.  IF CONFIRMATION IS NEEDED FOR ANY PURPOSE, NOTIFY LAB WITHIN 5 DAYS.        LOWEST DETECTABLE LIMITS FOR URINE DRUG SCREEN Drug Class       Cutoff (ng/mL) Amphetamine      1000 Barbiturate      200 Benzodiazepine   694 Tricyclics       854 Opiates          300 Cocaine          300 THC              50   CBC with Differential     Status: Abnormal   Collection Time: 06/22/16  8:11 PM  Result Value Ref Range   WBC 8.1 4.0 - 10.5 K/uL   RBC 3.90 3.87 - 5.11 MIL/uL   Hemoglobin 11.4 (L) 12.0 - 15.0 g/dL   HCT 34.7 (L) 36.0 - 46.0 %   MCV 89.0 78.0 - 100.0 fL   MCH 29.2 26.0 - 34.0 pg   MCHC 32.9 30.0 - 36.0 g/dL   RDW 13.6 11.5 - 15.5 %   Platelets 312 150 - 400 K/uL   Neutrophils Relative % 57 %   Neutro Abs 4.6 1.7 - 7.7 K/uL   Lymphocytes Relative 30 %   Lymphs Abs 2.4 0.7 - 4.0 K/uL   Monocytes Relative 9 %   Monocytes Absolute 0.8 0.1 - 1.0 K/uL   Eosinophils Relative 3 %   Eosinophils Absolute 0.3 0.0 - 0.7 K/uL   Basophils Relative 1 %   Basophils Absolute 0.0 0.0 - 0.1 K/uL  Basic metabolic panel     Status: Abnormal   Collection Time: 06/22/16  8:11 PM  Result Value Ref Range   Sodium 141 135 - 145 mmol/L   Potassium 4.1 3.5 - 5.1 mmol/L   Chloride 104 101 - 111 mmol/L   CO2 30 22 - 32  mmol/L   Glucose, Bld 116 (H) 65 - 99 mg/dL   BUN 15 6 - 20 mg/dL   Creatinine, Ser 0.83 0.44 - 1.00 mg/dL   Calcium 9.1 8.9 - 10.3 mg/dL   GFR calc non Af Amer >60 >  60 mL/min   GFR calc Af Amer >60 >60 mL/min    Comment: (NOTE) The eGFR has been calculated using the CKD EPI equation. This calculation has not been validated in all clinical situations. eGFR's persistently <60 mL/min signify possible Chronic Kidney Disease.    Anion gap 7 5 - 15  Ethanol     Status: None   Collection Time: 06/22/16  8:11 PM  Result Value Ref Range   Alcohol, Ethyl (B) <5 <5 mg/dL    Comment:        LOWEST DETECTABLE LIMIT FOR SERUM ALCOHOL IS 5 mg/dL FOR MEDICAL PURPOSES ONLY     No current facility-administered medications for this encounter.    Current Outpatient Prescriptions  Medication Sig Dispense Refill  . buPROPion (WELLBUTRIN XL) 300 MG 24 hr tablet Take 1 tablet (300 mg total) by mouth daily. 30 tablet 0  . gabapentin (NEURONTIN) 400 MG capsule Take 2 capsules (800 mg total) by mouth 3 (three) times daily. 180 capsule 0  . hydrOXYzine (ATARAX/VISTARIL) 25 MG tablet Take 1 tablet (25 mg total) by mouth every 6 (six) hours as needed for anxiety. (Patient taking differently: Take 75 mg by mouth 4 (four) times daily as needed for anxiety. ) 30 tablet 0  . nicotine (NICODERM CQ - DOSED IN MG/24 HOURS) 21 mg/24hr patch Place 1 patch (21 mg total) onto the skin daily. 28 patch 0  . omeprazole (PRILOSEC) 40 MG capsule Take 40 mg by mouth 2 (two) times daily.     . traZODone (DESYREL) 100 MG tablet Take 1 tablet (100 mg total) by mouth at bedtime. 30 tablet 0  . venlafaxine XR (EFFEXOR-XR) 75 MG 24 hr capsule Take 1 capsule (75 mg total) by mouth daily. 30 capsule 0  . pantoprazole (PROTONIX) 40 MG tablet Take 1 tablet (40 mg total) by mouth 2 (two) times daily. (Patient not taking: Reported on 06/22/2016) 60 tablet 0    Musculoskeletal: Unable to assess: camera  Psychiatric Specialty  Exam: Physical Exam  Review of Systems  Psychiatric/Behavioral: Positive for depression, substance abuse and suicidal ideas. Negative for hallucinations and memory loss. The patient is nervous/anxious and has insomnia.   All other systems reviewed and are negative.   Blood pressure 127/77, pulse 84, temperature 98.6 F (37 C), temperature source Oral, resp. rate 18, height _0  (1.626 m), weight 117.9 kg (260 lb), last menstrual period 06/20/2016, SpO2 95 %.Body mass index is 44.63 kg/m.  General Appearance: Disheveled  Eye Contact:  Fair  Speech:  Slurred  Volume:  Decreased  Mood:  Anxious, Depressed, Hopeless and Worthless  Affect:  Congruent and Depressed  Thought Process:  Coherent, Goal Directed and Linear  Orientation:  Other:  person and place  Thought Content:  Logical  Suicidal Thoughts:  Yes.  with intent/plan  Homicidal Thoughts:  No  Memory:  Immediate;   Good Recent;   Good Remote;   Fair  Judgement:  Poor  Insight:  Lacking  Psychomotor Activity:  Normal  Concentration:  Concentration: Good and Attention Span: Good  Recall:  [employer] of Knowledge:  Fair  Language:  Good  Akathisia:  No  Handed:  Right  AIMS (if indicated):     Assets:  Communication Skills Desire for Improvement Financial Resources/Insurance Housing  ADL's:  Intact  Cognition:  WNL  Sleep:        Treatment Plan Summary: Daily contact with patient to assess and evaluate symptoms and progress in treatment and Medication  management  Disposition: Recommend psychiatric Inpatient admission when medically cleared. Pt accepted to [facility] Bed 504-1, may come after 8:30 PM.   Ethelene Hal, NP 06/23/2016 9:03 AM

## 2016-06-23 NOTE — Tx Team (Signed)
Initial Treatment Plan 06/23/2016 9:40 PM Haley Brady OOJ:[id number]    PATIENT STRESSORS: Marital or family conflict Substance abuse   PATIENT STRENGTHS: Ability for insight Average or above average intelligence Capable of independent living General fund of knowledge Motivation for treatment/growth   PATIENT IDENTIFIED PROBLEMS: Depression Anxiety Suicidal thoughts Substance Abuse "My anxiety, my mind racing and depression getting worse and I still want to go to an [facility]"                     DISCHARGE CRITERIA:  Ability to meet basic life and health needs Improved stabilization in mood, thinking, and/or behavior Verbal commitment to aftercare and medication compliance Withdrawal symptoms are absent or subacute and managed without 24-hour nursing intervention  PRELIMINARY DISCHARGE PLAN: Attend aftercare/continuing care group Placement in alternative living arrangements  PATIENT/FAMILY INVOLVEMENT: This treatment plan has been presented to and reviewed with the patient, Haley Brady, and/or family member, .  The patient and family have been given the opportunity to ask questions and make suggestions.  Tiena Manansala, [place], [place] 06/23/2016, 9:40 PM

## 2016-06-23 NOTE — ED Notes (Signed)
Pt gave 2 silver colored rings to her mother to take home along w/ her purse.

## 2016-06-23 NOTE — ED Notes (Addendum)
Assumed care of patient from [place], [place], per Apolonio Schneiders, patient will be reevaluated by TTS in the morning. Pt assessed by previous RN and TELE PSYCH prior to this RN assuming care. Please see TTS/EDP documentation. Pt updated accordingly and comfortable with plan of care. Pt resting quietly at this time. No distress. No complaints. Crackers, Jello, Sprite given. VSS. Pt sleeping in intervals. PT remains calm, continues to deny SI/HI. Call bell within reach.  Mother can be reached at [phone] Thermon Leyland).

## 2016-06-23 NOTE — Progress Notes (Signed)
Per pt she had not recieved her 3rd dose of Neurontin this evening and she stated the Protonix does not work for her

## 2016-06-23 NOTE — ED Notes (Signed)
Pt's mother and daughter visiting.

## 2016-06-24 ENCOUNTER — Encounter (HOSPITAL_COMMUNITY): Payer: Self-pay | Admitting: Psychiatry

## 2016-06-24 DIAGNOSIS — F332 Major depressive disorder, recurrent severe without psychotic features: Principal | ICD-10-CM

## 2016-06-24 DIAGNOSIS — F1220 Cannabis dependence, uncomplicated: Secondary | ICD-10-CM

## 2016-06-24 DIAGNOSIS — F17210 Nicotine dependence, cigarettes, uncomplicated: Secondary | ICD-10-CM

## 2016-06-24 DIAGNOSIS — F419 Anxiety disorder, unspecified: Secondary | ICD-10-CM

## 2016-06-24 DIAGNOSIS — F429 Obsessive-compulsive disorder, unspecified: Secondary | ICD-10-CM

## 2016-06-24 DIAGNOSIS — F1120 Opioid dependence, uncomplicated: Secondary | ICD-10-CM | POA: Diagnosis present

## 2016-06-24 DIAGNOSIS — F39 Unspecified mood [affective] disorder: Secondary | ICD-10-CM

## 2016-06-24 DIAGNOSIS — G4700 Insomnia, unspecified: Secondary | ICD-10-CM

## 2016-06-24 DIAGNOSIS — F1510 Other stimulant abuse, uncomplicated: Secondary | ICD-10-CM

## 2016-06-24 DIAGNOSIS — F1420 Cocaine dependence, uncomplicated: Secondary | ICD-10-CM

## 2016-06-24 LAB — PREGNANCY, URINE: PREG TEST UR: NEGATIVE

## 2016-06-24 MED ORDER — QUETIAPINE FUMARATE 50 MG PO TABS
50.0000 mg | ORAL_TABLET | Freq: Every day | ORAL | Status: DC
  Administered 2016-06-24: 50 mg via ORAL
  Filled 2016-06-24 (×3): qty 1

## 2016-06-24 MED ORDER — VENLAFAXINE HCL ER 150 MG PO CP24
150.0000 mg | ORAL_CAPSULE | Freq: Every day | ORAL | Status: DC
  Administered 2016-06-25 – 2016-07-01 (×7): 150 mg via ORAL
  Filled 2016-06-24 (×9): qty 1

## 2016-06-24 MED ORDER — QUETIAPINE FUMARATE 25 MG PO TABS: 25.0000 mg | ORAL_TABLET | Freq: Three times a day (TID) | ORAL | Status: DC | PRN

## 2016-06-24 MED ORDER — TRAZODONE HCL 50 MG PO TABS
50.0000 mg | ORAL_TABLET | Freq: Every evening | ORAL | Status: DC | PRN
  Administered 2016-06-24 – 2016-06-27 (×4): 50 mg via ORAL
  Filled 2016-06-24 (×4): qty 1

## 2016-06-24 NOTE — Progress Notes (Signed)
Recreation Therapy Notes  Date: 06/24/16 Time: 1000 Location: 500 Hall Dayroom  Group Topic: Leisure Education  Goal Area(s) Addresses:  Patient will identify positive leisure activities.  Patient will identify one positive benefit of participation in leisure activities.   Intervention: Dry erase marker, eraser, slips of paper with words  Activity: Pictionary.  Patients were divided into 2 teams.  Patients had to pull a word from the can and either draw it or act it out.  The team was given a minute to guess the picture.  If the team did not guess correctly, the other got the chance to steal the point.  If no one guessed the picture, no one got the point and the next team would go.  Education:  Leisure Education, Dentist  Education Outcome: Acknowledges education/In group clarification offered/Needs additional education  Clinical Observations/Feedback: Pt did not attend group.   [name], LRT/CTRS         [name] A 06/24/2016 12:28 PM
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
## 2016-06-24 NOTE — Progress Notes (Signed)
DAR NOTE: Patient presents with flat affect and depressed mood.  Patient forward little during assessment.  Denies pain, auditory and visual hallucinations.  Described energy level as low and concentration as poor.  Rates depression at 9, hopelessness at 9, and anxiety at 9.  Maintained on routine safety checks.  Medications given as prescribed.  Support and encouragement offered as needed.  States goal for today is "depression, anxiety, and hopelessness."  Patient is withdrawn and remained in her room most of this shift sleeping.

## 2016-06-24 NOTE — BHH Group Notes (Signed)
[facility] LCSW Aftercare Discharge Planning Group Note   06/24/2016 10:28 AM  Participation Quality:  Invited.  Chose to not attend.    Trish Mage

## 2016-06-24 NOTE — Plan of Care (Signed)
Problem: Medication: Goal: Compliance with prescribed medication regimen will improve Outcome: Progressing Patient is compliant with medication regimen.

## 2016-06-24 NOTE — H&P (Signed)
Psychiatric Admission Assessment Adult  Patient Identification: Haley Brady MRN:  [id number] Date of Evaluation:  06/24/2016 Chief Complaint:  Patient states " I am very anxious and depressed."   Principal Diagnosis: MDD (major depressive disorder), recurrent severe, without psychosis ([place]) Diagnosis:   Patient Active Problem List   Diagnosis Date Noted  . OCD (obsessive compulsive disorder) [F42.9] 06/24/2016  . Cocaine use disorder, severe, dependence ([place]) [F14.20] 06/24/2016  . Opioid use disorder, severe, dependence ([place]) [F11.20] 06/24/2016  . Cannabis use disorder, severe, dependence ([place]) [F12.20] 06/24/2016  . 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 Depression [F32.9]   . Polysubstance dependence including opioid drug with daily use ([place]) [F19.20] 04/12/2016  . MDD (major depressive disorder), recurrent severe, without psychosis ([place]) [F33.2] 04/11/2016  . Dyspepsia [R10.13] 08/26/2014  . Chronic nausea [R11.0] 05/28/2011  . URINALYSIS, ABNORMAL [R82.99] 08/06/2006  . MRSA INFECTION [B95.7] 05/23/2006  . OBESITY NOS [E66.9] 05/23/2006  . Anxiety [F41.9] 05/23/2006  . MIGRAINE HEADACHE [G43.909] 05/23/2006  . GERD [K21.9] 05/23/2006  . LOW BACK PAIN, CHRONIC [M54.5] 05/23/2006   History of Present Illness: [name] is a 31 yr old CF, who is single , unemployed , has a hx of depression, anxiety and polysubstance abuse , presented from [place] with worsening mood sx as well as relapse on drugs.  Patient seen and chart reviewed.Discussed patient with treatment team. Patient today seen as depressed , withdrawn , reports sadness, insomnia, racing thoughts , crying spells, fatigue ,feeling guilty,hopeless, concentration problems and so on. Pt reports anxiety and panic attacks , with physical sensation like racing heart rate , SOB and so on. She reports OCD sx like obsessive cleaning herself as well as her surroundings. She reports she is on effexor and wellbutrin. Effexor has been  keeping her OCD under control, but the wellbutrin has not been helping at all for her depression. She reports she has been abusing a lot of drugs- cocaine - started abusing at the age of 15, worsening since her miscarriage in February. She has been abusing opioids- heroin, pain pills - since the age 31 y, snorts it and injects it. She reports abusing cannabis , since the age of 50 y , 3 blunts daily. Stimulant abuse - recently - episodic. She was discharged from [facility] to [facility] in march , stayed there for a few weeks and then was released to [facility] , which is like a drug rehab program , but she failed the UDS and was kicked out from there . She reports that she went and started abusing a lot of drugs soon after that. She has a 23 y old daughter , who is currently with her father . She reports she wants to get better and get back on track.    Associated Signs/Symptoms: Depression Symptoms:  depressed mood, anhedonia, insomnia, fatigue, feelings of worthlessness/guilt, difficulty concentrating, hopelessness, anxiety, panic attacks, loss of energy/fatigue, (Hypo) Manic Symptoms:  Distractibility, Impulsivity, Anxiety Symptoms:  Excessive Worry, Psychotic Symptoms:  denies PTSD Symptoms: Negative Total Time spent with patient: 45 minutes  Past Psychiatric History: Hx of MDD, panic do, several IP admissions at [facility], [facility], [facility] and other places. Denies hx of suicide attempts. Used to follow up with health department.  Is the patient at risk to self? Yes.    Has the patient been a risk to self in the past 6 months? No.  Has the patient been a risk to self within the distant past? No.  Is the patient a risk to  others? No.  Has the patient been a risk to others in the past 6 months? No.  Has the patient been a risk to others within the distant past? No.   Prior Inpatient Therapy:   Prior Outpatient Therapy:    Alcohol Screening: 1. How often do you have a drink containing  alcohol?: Monthly or less 2. How many drinks containing alcohol do you have on a typical day when you are drinking?: 3 or 4 3. How often do you have six or more drinks on one occasion?: Never Preliminary Score: 1 4. How often during the last year have you found that you were not able to stop drinking once you had started?: Never 5. How often during the last year have you failed to do what was normally expected from you becasue of drinking?: Never 6. How often during the last year have you needed a first drink in the morning to get yourself going after a heavy drinking session?: Never 7. How often during the last year have you had a feeling of guilt of remorse after drinking?: Never 8. How often during the last year have you been unable to remember what happened the night before because you had been drinking?: Never 9. Have you or someone else been injured as a result of your drinking?: No 10. Has a relative or friend or a doctor or another health worker been concerned about your drinking or suggested you cut down?: No Alcohol Use Disorder Identification Test Final Score (AUDIT): 2 Brief Intervention: AUDIT score less than 7 or less-screening does not suggest unhealthy drinking-brief intervention not indicated Substance Abuse History in the last 12 months:  Yes.   see above Consequences of Substance Abuse: Medical Consequences:  IP admissions Legal Consequences:  denies Family Consequences:  relational issues Previous Psychotropic Medications: Yes - wellbutrin, effexor Psychological Evaluations: Yes  Past Medical History:  Past Medical History:  Diagnosis Date  . Abnormal pap   . Asymptomatic varicose veins   . Chronic back pain   . Chronic nausea   . DDD (degenerative disc disease)   . Depression   . Esophageal reflux   . Ingrowing nail   . Migraines   . Panic disorder without agoraphobia   . Renal disorder   . Vaginal Pap smear, abnormal     Past Surgical History:  Procedure  Laterality Date  . BRAVO [place] STUDY  05/2011   on nexium '40mg'$  daily 217 episodes of reflux, Demeester score day 1: 51.9 and Day 2: 24.8.   [place] CHOLECYSTECTOMY    . ESOPHAGOGASTRODUODENOSCOPY  06/11/2011    SMALL HIATAL HERNIA/Moderate gastritis/  BURNING CHESTPAIN: GERD, NERD, OR NON-ULCER DYSPEPSIA/ Nl esophagus  . TONSILLECTOMY     Family History:  Family History  Problem Relation Age of Onset  . Heart disease Mother   . Pancreatic cancer Father 16   Family Psychiatric  History: denies  Tobacco Screening: Have you used any form of tobacco in the last 30 days? (Cigarettes, Smokeless Tobacco, Cigars, and/or Pipes): Yes Tobacco use, Select all that apply: 5 or more cigarettes per day Are you interested in Tobacco Cessation Medications?: Yes, will notify MD for an order Counseled patient on smoking cessation including recognizing danger situations, developing coping skills and basic information about quitting provided: Refused/Declined practical counseling Social History: Pt is single , has a 54 yr old daughter who is with her father , unemployed , gets child support and food stamps , currently homeless. History  Alcohol Use  .  Yes    Comment: occasionally     History  Drug Use  . Types: Cocaine, Marijuana, Methamphetamines    Additional Social History:                           Allergies:  No Known Allergies Lab Results:  Results for orders placed or performed during the hospital encounter of 06/22/16 (from the past 48 hour(s))  Urine rapid drug screen (hosp performed)     Status: Abnormal   Collection Time: 06/22/16  8:00 PM  Result Value Ref Range   Opiates POSITIVE (A) NONE DETECTED   Cocaine POSITIVE (A) NONE DETECTED   Benzodiazepines NONE DETECTED NONE DETECTED   Amphetamines POSITIVE (A) NONE DETECTED   Tetrahydrocannabinol POSITIVE (A) NONE DETECTED   Barbiturates NONE DETECTED NONE DETECTED    Comment:        DRUG SCREEN FOR MEDICAL PURPOSES ONLY.  IF  CONFIRMATION IS NEEDED FOR ANY PURPOSE, NOTIFY LAB WITHIN 5 DAYS.        LOWEST DETECTABLE LIMITS FOR URINE DRUG SCREEN Drug Class       Cutoff (ng/mL) Amphetamine      1000 Barbiturate      200 Benzodiazepine   638 Tricyclics       756 Opiates          300 Cocaine          300 THC              50   CBC with Differential     Status: Abnormal   Collection Time: 06/22/16  8:11 PM  Result Value Ref Range   WBC 8.1 4.0 - 10.5 K/uL   RBC 3.90 3.87 - 5.11 MIL/uL   Hemoglobin 11.4 (L) 12.0 - 15.0 g/dL   HCT 34.7 (L) 36.0 - 46.0 %   MCV 89.0 78.0 - 100.0 fL   MCH 29.2 26.0 - 34.0 pg   MCHC 32.9 30.0 - 36.0 g/dL   RDW 13.6 11.5 - 15.5 %   Platelets 312 150 - 400 K/uL   Neutrophils Relative % 57 %   Neutro Abs 4.6 1.7 - 7.7 K/uL   Lymphocytes Relative 30 %   Lymphs Abs 2.4 0.7 - 4.0 K/uL   Monocytes Relative 9 %   Monocytes Absolute 0.8 0.1 - 1.0 K/uL   Eosinophils Relative 3 %   Eosinophils Absolute 0.3 0.0 - 0.7 K/uL   Basophils Relative 1 %   Basophils Absolute 0.0 0.0 - 0.1 K/uL  Basic metabolic panel     Status: Abnormal   Collection Time: 06/22/16  8:11 PM  Result Value Ref Range   Sodium 141 135 - 145 mmol/L   Potassium 4.1 3.5 - 5.1 mmol/L   Chloride 104 101 - 111 mmol/L   CO2 30 22 - 32 mmol/L   Glucose, Bld 116 (H) 65 - 99 mg/dL   BUN 15 6 - 20 mg/dL   Creatinine, Ser 0.83 0.44 - 1.00 mg/dL   Calcium 9.1 8.9 - 10.3 mg/dL   GFR calc non Af Amer >60 >60 mL/min   GFR calc Af Amer >60 >60 mL/min    Comment: (NOTE) The eGFR has been calculated using the CKD EPI equation. This calculation has not been validated in all clinical situations. eGFR's persistently <60 mL/min signify possible Chronic Kidney Disease.    Anion gap 7 5 - 15  Ethanol     Status: None   Collection  Time: 06/22/16  8:11 PM  Result Value Ref Range   Alcohol, Ethyl (B) <5 <5 mg/dL    Comment:        LOWEST DETECTABLE LIMIT FOR SERUM ALCOHOL IS 5 mg/dL FOR MEDICAL PURPOSES ONLY     Blood  Alcohol level:  Lab Results  Component Value Date   ETH <5 06/22/2016   ETH <5 09/60/4540    Metabolic Disorder Labs:  No results found for: HGBA1C, MPG No results found for: PROLACTIN No results found for: CHOL, TRIG, HDL, CHOLHDL, VLDL, LDLCALC  Current Medications: Current Facility-Administered Medications  Medication Dose Route Frequency Provider Last Rate Last Dose  . acetaminophen (TYLENOL) tablet 650 mg  650 mg Oral Q6H PRN Ethelene Hal, NP      . alum & mag hydroxide-simeth (MAALOX/MYLANTA) 200-200-20 MG/5ML suspension 30 mL  30 mL Oral Q4H PRN Ethelene Hal, NP      . buPROPion (WELLBUTRIN XL) 24 hr tablet 300 mg  300 mg Oral Daily Ethelene Hal, NP   300 mg at 06/24/16 0820  . cloNIDine (CATAPRES) tablet 0.1 mg  0.1 mg Oral QID Ethelene Hal, NP   0.1 mg at 06/24/16 1201   Followed by  . [START ON 06/26/2016] cloNIDine (CATAPRES) tablet 0.1 mg  0.1 mg Oral BH-qamhs Ethelene Hal, NP       Followed by  . [START ON 06/29/2016] cloNIDine (CATAPRES) tablet 0.1 mg  0.1 mg Oral QAC breakfast Ethelene Hal, NP      . dicyclomine (BENTYL) tablet 20 mg  20 mg Oral Q6H PRN Ethelene Hal, NP      . gabapentin (NEURONTIN) capsule 800 mg  800 mg Oral TID Ethelene Hal, NP   800 mg at 06/24/16 1200  . hydrOXYzine (ATARAX/VISTARIL) tablet 25 mg  25 mg Oral Q6H PRN Ethelene Hal, NP   25 mg at 06/23/16 2212  . loperamide (IMODIUM) capsule 2-4 mg  2-4 mg Oral PRN Ethelene Hal, NP      . magnesium hydroxide (MILK OF MAGNESIA) suspension 30 mL  30 mL Oral Daily PRN Ethelene Hal, NP      . methocarbamol (ROBAXIN) tablet 500 mg  500 mg Oral Q8H PRN Ethelene Hal, NP      . naproxen (NAPROSYN) tablet 500 mg  500 mg Oral BID PRN Ethelene Hal, NP   500 mg at 06/23/16 2212  . nicotine (NICODERM CQ - dosed in mg/24 hours) patch 21 mg  21 mg Transdermal Daily Ethelene Hal, NP      .  ondansetron (ZOFRAN-ODT) disintegrating tablet 4 mg  4 mg Oral Q6H PRN Ethelene Hal, NP      . pantoprazole (PROTONIX) EC tablet 40 mg  40 mg Oral BID Ethelene Hal, NP   40 mg at 06/24/16 0820  . traZODone (DESYREL) tablet 100 mg  100 mg Oral QHS Ethelene Hal, NP   100 mg at 06/23/16 2212  . venlafaxine XR (EFFEXOR-XR) 24 hr capsule 75 mg  75 mg Oral Daily Ethelene Hal, NP   75 mg at 06/24/16 0820   PTA Medications: Prescriptions Prior to Admission  Medication Sig Dispense Refill Last Dose  . buPROPion (WELLBUTRIN XL) 300 MG 24 hr tablet Take 1 tablet (300 mg total) by mouth daily. 30 tablet 0 06/22/2016 at Unknown time  . gabapentin (NEURONTIN) 400 MG capsule Take 2 capsules (800 mg total) by mouth 3 (three) times  daily. 180 capsule 0 06/22/2016 at Unknown time  . hydrOXYzine (ATARAX/VISTARIL) 25 MG tablet Take 1 tablet (25 mg total) by mouth every 6 (six) hours as needed for anxiety. (Patient taking differently: Take 75 mg by mouth 4 (four) times daily as needed for anxiety. ) 30 tablet 0 06/22/2016 at Unknown time  . nicotine (NICODERM CQ - DOSED IN MG/24 HOURS) 21 mg/24hr patch Place 1 patch (21 mg total) onto the skin daily. 28 patch 0 06/21/2016 at Unknown time  . omeprazole (PRILOSEC) 40 MG capsule Take 40 mg by mouth 2 (two) times daily.    06/22/2016 at Unknown time  . pantoprazole (PROTONIX) 40 MG tablet Take 1 tablet (40 mg total) by mouth 2 (two) times daily. (Patient not taking: Reported on 06/22/2016) 60 tablet 0 Not Taking at Unknown time  . traZODone (DESYREL) 100 MG tablet Take 1 tablet (100 mg total) by mouth at bedtime. 30 tablet 0 06/21/2016 at Unknown time  . venlafaxine XR (EFFEXOR-XR) 75 MG 24 hr capsule Take 1 capsule (75 mg total) by mouth daily. 30 capsule 0 06/22/2016 at Unknown time    Musculoskeletal: Strength & Muscle Tone: within normal limits Gait & Station: in bed Patient leans: N/A  Psychiatric Specialty Exam: Physical Exam   Review of Systems  Psychiatric/Behavioral: Positive for depression and substance abuse. The patient is nervous/anxious and has insomnia.   All other systems reviewed and are negative.   Blood pressure 105/62, pulse 81, temperature 97.9 F (36.6 C), temperature source Oral, resp. rate 18, height '5\' 4"'$  (1.626 m), weight 117.9 kg (260 lb), last menstrual period 06/20/2016.Body mass index is 44.63 kg/m.  General Appearance: Fairly Groomed  Eye Contact:  Fair  Speech:  Slow  Volume:  Decreased  Mood:  Anxious, Depressed and Dysphoric  Affect:  Depressed  Thought Process:  Goal Directed and Descriptions of Associations: Circumstantial  Orientation:  Full (Time, Place, and Person)  Thought Content:  Rumination  Suicidal Thoughts:  feels hopeless, guilty , able to contract for safety on the unit  Homicidal Thoughts:  No  Memory:  Immediate;   Fair Recent;   Fair Remote;   Fair  Judgement:  Impaired  Insight:  Shallow  Psychomotor Activity:  Normal  Concentration:  Concentration: Fair and Attention Span: Fair  Recall:  [employer] of Knowledge:  Fair  Language:  Fair  Akathisia:  No  Handed:  Right  AIMS (if indicated):     Assets:  Communication Skills Desire for Improvement  ADL's:  Intact  Cognition:  WNL  Sleep:  Number of Hours: 6.75    Treatment Plan Summary:Sharai is a 56 y old CF, who is single, unemployed, homeless who has a hx of depression, anxiety, polysubstance abuse , recent relapse , reports medications as not effective , will make medication readjustments and continue treatment.  Daily contact with patient to assess and evaluate symptoms and progress in treatment, Medication management and Plan see below Patient will benefit from inpatient treatment and stabilization.  Estimated length of stay is 5-7 days.  Reviewed past medical records,treatment plan.  Will continue Effexor 75 mg po daily for affective sx. Will discontinue Wellbutrin for lack of efficacy. Will  add Seroquel 50 mg po qhs for augmenting the effexor as well as for insomnia. Continue Neurontin 800 mg PO tid for anxiety. Will continue COWS/Clonidine protocol for opioid withdrawal sx. Will make Trazodone 100 mg po qhs prn for insomnia. Will continue to monitor vitals ,medication compliance and treatment  side effects while patient is here.  Will monitor for medical issues as well as call consult as needed.  Reviewed labs hb/hct - low - will monitor , will get vitamin b12, folate, iron panel , urine pregnancy test, UDS - pos for stimulants, thc, cocaine, opioids, will get tsh, lipid panel, hba1c, pl . Will get EKG for qtc monitoring. CSW will start working on disposition. Patient to be referred to a substance abuse program.she is motivated to do so. Patient to participate in therapeutic milieu .      Observation Level/Precautions:  15 minute checks    Psychotherapy:  Individual and group therapy     Consultations:  CSW  Discharge Concerns: Stability and safety        Physician Treatment Plan for Primary Diagnosis: MDD (major depressive disorder), recurrent severe, without psychosis ([place]) Long Term Goal(s): Improvement in symptoms so as ready for discharge  Short Term Goals: Ability to verbalize feelings will improve, Ability to disclose and discuss suicidal ideas, Compliance with prescribed medications will improve and Ability to identify triggers associated with substance abuse/mental health issues will improve  Physician Treatment Plan for Secondary Diagnosis: Principal Problem:   MDD (major depressive disorder), recurrent severe, without psychosis ([place]) Active Problems:   OCD (obsessive compulsive disorder)   Cocaine use disorder, severe, dependence ([place])   Opioid use disorder, severe, dependence ([place])   Cannabis use disorder, severe, dependence ([place])   Stimulant abuse  Long Term Goal(s): Improvement in symptoms so as ready for discharge  Short Term Goals: Ability to  verbalize feelings will improve, Ability to disclose and discuss suicidal ideas, Compliance with prescribed medications will improve and Ability to identify triggers associated with substance abuse/mental health issues will improve  I certify that inpatient services furnished can reasonably be expected to improve the patient's condition.    Ursula Alert, MD 5/28/201812:40 PM

## 2016-06-24 NOTE — BHH Suicide Risk Assessment (Signed)
[facility] Admission Suicide Risk Assessment   Nursing information obtained from:    Demographic factors:    Current Mental Status:    Loss Factors:    Historical Factors:    Risk Reduction Factors:     Total Time spent with patient: 30 minutes Principal Problem: MDD (major depressive disorder), recurrent severe, without psychosis ([place]) Diagnosis:   Patient Active Problem List   Diagnosis Date Noted  . OCD (obsessive compulsive disorder) [F42.9] 06/24/2016  . Cocaine use disorder, severe, dependence ([place]) [F14.20] 06/24/2016  . Opioid use disorder, severe, dependence ([place]) [F11.20] 06/24/2016  . Cannabis use disorder, severe, dependence ([place]) [F12.20] 06/24/2016  . 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 Depression [F32.9]   . Polysubstance dependence including opioid drug with daily use ([place]) [F19.20] 04/12/2016  . MDD (major depressive disorder), recurrent severe, without psychosis ([place]) [F33.2] 04/11/2016  . Dyspepsia [R10.13] 08/26/2014  . Chronic nausea [R11.0] 05/28/2011  . URINALYSIS, ABNORMAL [R82.99] 08/06/2006  . MRSA INFECTION [B95.7] 05/23/2006  . OBESITY NOS [E66.9] 05/23/2006  . Anxiety [F41.9] 05/23/2006  . MIGRAINE HEADACHE [G43.909] 05/23/2006  . GERD [K21.9] 05/23/2006  . LOW BACK PAIN, CHRONIC [M54.5] 05/23/2006   Subjective Data: Please see H&P.   Continued Clinical Symptoms:  Alcohol Use Disorder Identification Test Final Score (AUDIT): 2 The "Alcohol Use Disorders Identification Test", Guidelines for Use in Primary Care, Second Edition.  World Pharmacologist [facility]). Score between 0-7:  no or low risk or alcohol related problems. Score between 8-15:  moderate risk of alcohol related problems. Score between 16-19:  high risk of alcohol related problems. Score 20 or above:  warrants further diagnostic evaluation for alcohol dependence and treatment.   CLINICAL FACTORS:   Alcohol/Substance Abuse/Dependencies Previous Psychiatric Diagnoses and  Treatments   Musculoskeletal: Strength & Muscle Tone: within normal limits Gait & Station: normal Patient leans: N/A  Psychiatric Specialty Exam: Physical Exam  ROS  Blood pressure 105/62, pulse 81, temperature 97.9 F (36.6 C), temperature source Oral, resp. rate 18, height 5\' 4"  (1.626 m), weight 117.9 kg (260 lb), last menstrual period 06/20/2016.Body mass index is 44.63 kg/m.              Please see H&P.                                             COGNITIVE FEATURES THAT CONTRIBUTE TO RISK:  Closed-mindedness, Polarized thinking and Thought constriction (tunnel vision)    SUICIDE RISK:   Moderate:  Frequent suicidal ideation with limited intensity, and duration, some specificity in terms of plans, no associated intent, good self-control, limited dysphoria/symptomatology, some risk factors present, and identifiable protective factors, including available and accessible social support.  PLAN OF CARE: Please see H&P.   I certify that inpatient services furnished can reasonably be expected to improve the patient's condition.   Clayden Withem, MD 06/24/2016, 12:05 PM

## 2016-06-24 NOTE — Progress Notes (Signed)
Adult Psychoeducational Group Note  Date:  06/24/2016 Time:  9:18 PM  Group Topic/Focus:  Wrap-Up Group:   The focus of this group is to help patients review their daily goal of treatment and discuss progress on daily workbooks.  Participation Level:  Did Not Attend  Participation Quality:  Did not attend  Affect:  Did not attend  Cognitive:  Did not attend  Insight: None  Engagement in Group:  Did not attend  Modes of Intervention:  Did not attend  Additional Comments:  Patient did not attend wrap up group.  Candy Sledge 06/24/2016, 9:18 PM

## 2016-06-25 LAB — IRON AND TIBC
Iron: 30 ug/dL (ref 28–170)
SATURATION RATIOS: 8 % — AB (ref 10.4–31.8)
TIBC: 361 ug/dL (ref 250–450)
UIBC: 331 ug/dL

## 2016-06-25 LAB — VITAMIN B12: VITAMIN B 12: 749 pg/mL (ref 180–914)

## 2016-06-25 LAB — LIPID PANEL
CHOLESTEROL: 193 mg/dL (ref 0–200)
HDL: 46 mg/dL (ref >40)
LDL Cholesterol: 101 mg/dL — ABNORMAL HIGH (ref 0–99)
Total CHOL/HDL Ratio: 4.2 RATIO
Triglycerides: 232 mg/dL — ABNORMAL HIGH (ref <150)
VLDL: 46 mg/dL — AB (ref 0–40)

## 2016-06-25 LAB — TSH: TSH: 1.327 u[IU]/mL (ref 0.350–4.500)

## 2016-06-25 LAB — FERRITIN: Ferritin: 13 ng/mL (ref 11–307)

## 2016-06-25 LAB — FOLATE: Folate: 13.4 ng/mL (ref >5.9)

## 2016-06-25 MED ORDER — QUETIAPINE FUMARATE 50 MG PO TABS
75.0000 mg | ORAL_TABLET | Freq: Every day | ORAL | Status: DC
  Administered 2016-06-25: 75 mg via ORAL
  Filled 2016-06-25 (×4): qty 1

## 2016-06-25 NOTE — BHH Group Notes (Signed)
[place] LCSW Group Therapy  06/25/2016 2:01 PM   Type of Therapy:  Group Therapy  Participation Level:  Active  Participation Quality:  Attentive  Affect:  Appropriate  Cognitive:  Appropriate  Insight:  Improving  Engagement in Therapy:  Engaged  Modes of Intervention:  Clarification, Education, Exploration and Socialization  Summary of Progress/Problems: Today's group focused on relapse prevention.  We defined the term, and then brainstormed on ways to prevent relapse. Invited.  Chose to not attend.  [name] B 06/25/2016 , 2:01 PM

## 2016-06-25 NOTE — Progress Notes (Signed)
DAR NOTE: Patient presents with flat affect and depressed mood.  Denies suicidal thoughts, auditory and visual hallucinations.  Denies any withdrawal symptoms.  Maintained on routine safety checks.  Medications given as prescribed.  Support and encouragement offered as needed.  States goal for today is "treatment."  Patient remained in bed sleeping most of this shift.  Patient encouraged to be visible in milieu.   Patient only out of her room for medication and meals.

## 2016-06-25 NOTE — Progress Notes (Addendum)
[facility] MD Progress Note  06/25/2016 3:44 PM Haley Brady  MRN:  [id number] Subjective:  Pt states " I am anxious , but making some progress.'  Objective:Patient seen and chart reviewed.Discussed patient with treatment team.  Pt today seen in bed , reports her sadness and anxiety are improving. She continues to have sleep issues. She is motivated to get help with her substance abuse - wants to go to an [place]. Per staff - she is mostly withdrawn , but compliant on medications.    Principal Problem: MDD (major depressive disorder), recurrent severe, without psychosis ([place]) Diagnosis:   Patient Active Problem List   Diagnosis Date Noted  . OCD (obsessive compulsive disorder) [F42.9] 06/24/2016  . Cocaine use disorder, severe, dependence ([place]) [F14.20] 06/24/2016  . Opioid use disorder, severe, dependence ([place]) [F11.20] 06/24/2016  . Cannabis use disorder, severe, dependence ([place]) [F12.20] 06/24/2016  . 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 Depression [F32.9]   . Polysubstance dependence including opioid drug with daily use ([place]) [F19.20] 04/12/2016  . MDD (major depressive disorder), recurrent severe, without psychosis ([place]) [F33.2] 04/11/2016  . Dyspepsia [R10.13] 08/26/2014  . Chronic nausea [R11.0] 05/28/2011  . URINALYSIS, ABNORMAL [R82.99] 08/06/2006  . MRSA INFECTION [B95.7] 05/23/2006  . OBESITY NOS [E66.9] 05/23/2006  . Anxiety [F41.9] 05/23/2006  . MIGRAINE HEADACHE [G43.909] 05/23/2006  . GERD [K21.9] 05/23/2006  . LOW BACK PAIN, CHRONIC [M54.5] 05/23/2006   Total Time spent with patient: 25 minutes  Past Psychiatric History: Please see H&P.   Past Medical History:  Past Medical History:  Diagnosis Date  . Abnormal pap   . Asymptomatic varicose veins   . Chronic back pain   . Chronic nausea   . DDD (degenerative disc disease)   . Depression   . Esophageal reflux   . Ingrowing nail   . Migraines   . Panic disorder without agoraphobia   . Renal  disorder   . Vaginal Pap smear, abnormal     Past Surgical History:  Procedure Laterality Date  . BRAVO [place] STUDY  05/2011   on nexium 40mg  daily 217 episodes of reflux, Demeester score day 1: 51.9 and Day 2: 24.8.   [facility] [place] CHOLECYSTECTOMY    . ESOPHAGOGASTRODUODENOSCOPY  06/11/2011    SMALL HIATAL HERNIA/Moderate gastritis/  BURNING CHESTPAIN: GERD, NERD, OR NON-ULCER DYSPEPSIA/ Nl esophagus  . TONSILLECTOMY     Family History:  Family History  Problem Relation Age of Onset  . Heart disease Mother   . Pancreatic cancer Father 40   Family Psychiatric  History: Please see H&P.  Social History: Please see H&P.  History  Alcohol Use  . Yes    Comment: occasionally     History  Drug Use  . Types: Cocaine, Marijuana, Methamphetamines    Social History   Social History  . Marital status: Single    Spouse name: N/A  . Number of children: 1  . Years of education: N/A   Occupational History  . [employer]   Social History Main Topics  . Smoking status: Heavy Tobacco Smoker    Packs/day: 0.50    Types: Cigarettes  . Smokeless tobacco: Never Used     Comment: pt reports '4 cigarettes per day."  . Alcohol use Yes     Comment: occasionally  . Drug use: Yes    Types: Cocaine, Marijuana, Methamphetamines  . Sexual activity: Yes    Birth control/ protection: None   Other Topics Concern  . None  Social History Narrative   Lives w/ daughter-5   Additional Social History:                         Sleep: restless  Appetite:  Fair  Current Medications: Current Facility-Administered Medications  Medication Dose Route Frequency Provider Last Rate Last Dose  . acetaminophen (TYLENOL) tablet 650 mg  650 mg Oral Q6H PRN Ethelene Hal, NP      . alum & mag hydroxide-simeth (MAALOX/MYLANTA) 200-200-20 MG/5ML suspension 30 mL  30 mL Oral Q4H PRN Ethelene Hal, NP      . cloNIDine (CATAPRES) tablet 0.1 mg  0.1 mg Oral QID Ethelene Hal, NP   0.1 mg at 06/25/16 1215   Followed by  . [START ON 06/26/2016] cloNIDine (CATAPRES) tablet 0.1 mg  0.1 mg Oral BH-qamhs Ethelene Hal, NP       Followed by  . [START ON 06/29/2016] cloNIDine (CATAPRES) tablet 0.1 mg  0.1 mg Oral QAC breakfast Ethelene Hal, NP      . dicyclomine (BENTYL) tablet 20 mg  20 mg Oral Q6H PRN Ethelene Hal, NP      . gabapentin (NEURONTIN) capsule 800 mg  800 mg Oral TID Ethelene Hal, NP   800 mg at 06/25/16 1214  . hydrOXYzine (ATARAX/VISTARIL) tablet 25 mg  25 mg Oral Q6H PRN Ethelene Hal, NP   25 mg at 06/24/16 2115  . loperamide (IMODIUM) capsule 2-4 mg  2-4 mg Oral PRN Ethelene Hal, NP      . magnesium hydroxide (MILK OF MAGNESIA) suspension 30 mL  30 mL Oral Daily PRN Ethelene Hal, NP      . methocarbamol (ROBAXIN) tablet 500 mg  500 mg Oral Q8H PRN Ethelene Hal, NP      . naproxen (NAPROSYN) tablet 500 mg  500 mg Oral BID PRN Ethelene Hal, NP   500 mg at 06/24/16 2115  . nicotine (NICODERM CQ - dosed in mg/24 hours) patch 21 mg  21 mg Transdermal Daily Ethelene Hal, NP      . ondansetron (ZOFRAN-ODT) disintegrating tablet 4 mg  4 mg Oral Q6H PRN Ethelene Hal, NP      . pantoprazole (PROTONIX) EC tablet 40 mg  40 mg Oral BID Ethelene Hal, NP   40 mg at 06/25/16 7412  . QUEtiapine (SEROQUEL) tablet 25 mg  25 mg Oral TID PRN Ursula Alert, MD      . QUEtiapine (SEROQUEL) tablet 50 mg  50 mg Oral QHS Ursula Alert, MD   50 mg at 06/24/16 2115  . traZODone (DESYREL) tablet 50 mg  50 mg Oral QHS PRN Ursula Alert, MD   50 mg at 06/24/16 2115  . venlafaxine XR (EFFEXOR-XR) 24 hr capsule 150 mg  150 mg Oral Daily Ercell Perlman, MD   150 mg at 06/25/16 0803    Lab Results:  Results for orders placed or performed during the hospital encounter of 06/23/16 (from the past 48 hour(s))  Pregnancy, urine     Status: None   Collection Time: 06/24/16   5:34 PM  Result Value Ref Range   Preg Test, Ur NEGATIVE NEGATIVE    Comment:        THE SENSITIVITY OF THIS METHODOLOGY IS >20 mIU/mL. Performed at [facility], [place] [address], [place], [place] [address]   TSH     Status: None  Collection Time: 06/25/16  6:15 AM  Result Value Ref Range   TSH 1.327 0.350 - 4.500 uIU/mL    Comment: Performed by a 3rd Generation assay with a functional sensitivity of <=0.01 uIU/mL. Performed at [facility], [place] [address] [place], [place] [address]   Lipid panel     Status: Abnormal   Collection Time: 06/25/16  6:15 AM  Result Value Ref Range   Cholesterol 193 0 - 200 mg/dL   Triglycerides 232 (H) <150 mg/dL   HDL 46 >40 mg/dL   Total CHOL/HDL Ratio 4.2 RATIO   VLDL 46 (H) 0 - 40 mg/dL   LDL Cholesterol 101 (H) 0 - 99 mg/dL    Comment:        Total Cholesterol/HDL:CHD Risk Coronary Heart Disease Risk Table                     Men   Women  1/2 Average Risk   3.4   3.3  Average Risk       5.0   4.4  2 X Average Risk   9.6   7.1  3 X Average Risk  23.4   11.0        Use the calculated Patient Ratio above and the CHD Risk Table to determine the patient's CHD Risk.        ATP III CLASSIFICATION (LDL):  <100     mg/dL   Optimal  100-129  mg/dL   Near or Above                    Optimal  130-159  mg/dL   Borderline  160-189  mg/dL   High  >190     mg/dL   Very High Performed at [place] [address], [place], [place] [address]   Iron and TIBC     Status: Abnormal   Collection Time: 06/25/16  6:15 AM  Result Value Ref Range   Iron 30 28 - 170 ug/dL   TIBC 361 250 - 450 ug/dL   Saturation Ratios 8 (L) 10.4 - 31.8 %   UIBC 331 ug/dL    Comment: Performed at [facility], [place] [address] [place], [place] [address]  Ferritin     Status: None   Collection Time: 06/25/16  6:15 AM  Result Value Ref Range   Ferritin 13 11 - 307 ng/mL    Comment: Performed at [facility],  [place] [address] [place], [place] [address]  Vitamin B12     Status: None   Collection Time: 06/25/16  6:15 AM  Result Value Ref Range   Vitamin B-12 749 180 - 914 pg/mL    Comment: (NOTE) This assay is not validated for testing neonatal or myeloproliferative syndrome specimens for Vitamin B12 levels. Performed at [facility], [place] [address], [place], [place] [address]   Folate     Status: None   Collection Time: 06/25/16  6:15 AM  Result Value Ref Range   Folate 13.4 >5.9 ng/mL    Comment: Performed at [facility] [address] [place],  [address]    Blood Alcohol level:  Lab Results  Component Value Date   [facility] <5 06/22/2016   ETH <5 79/15/0569    Metabolic Disorder Labs: No results found for: HGBA1C, MPG No results found for: PROLACTIN Lab Results  Component Value Date   CHOL 193 06/25/2016  TRIG 232 (H) 06/25/2016   HDL 46 06/25/2016   CHOLHDL 4.2 06/25/2016   VLDL 46 (H) 06/25/2016   LDLCALC 101 (H) 06/25/2016    Physical Findings: AIMS: Facial and Oral Movements Muscles of Facial Expression: None, normal Lips and Perioral Area: None, normal Jaw: None, normal Tongue: None, normal,Extremity Movements Upper (arms, wrists, hands, fingers): None, normal Lower (legs, knees, ankles, toes): None, normal, Trunk Movements Neck, shoulders, hips: None, normal, Overall Severity Severity of abnormal movements (highest score from questions above): None, normal Incapacitation due to abnormal movements: None, normal Patient's awareness of abnormal movements (rate only patient's report): No Awareness, Dental Status Current problems with teeth and/or dentures?: No Does patient usually wear dentures?: No  CIWA:    COWS:  COWS Total Score: 4  Musculoskeletal: Strength & Muscle Tone: within normal limits Gait & Station: seen in bed Patient leans: N/A  Psychiatric Specialty Exam: Physical Exam  Nursing note and vitals reviewed.   Review of Systems   Psychiatric/Behavioral: Positive for depression and substance abuse. The patient is nervous/anxious and has insomnia.   All other systems reviewed and are negative.   Blood pressure 129/77, pulse 87, temperature 98.6 F (37 C), temperature source Oral, resp. rate 20, height 5\' 4"  (1.626 m), weight 117.9 kg (260 lb), last menstrual period 06/20/2016.Body mass index is 44.63 kg/m.  General Appearance: Guarded  Eye Contact:  Fair  Speech:  Slow  Volume:  Decreased  Mood:  Anxious and Depressed  Affect:  Depressed  Thought Process:  Goal Directed and Descriptions of Associations: Intact  Orientation:  Full (Time, Place, and Person)  Thought Content:  Rumination  Suicidal Thoughts:  No  Homicidal Thoughts:  No  Memory:  Immediate;   Fair Recent;   Fair Remote;   Fair  Judgement:  Impaired  Insight:  Shallow  Psychomotor Activity:  Decreased  Concentration:  Concentration: Poor and Attention Span: Poor  Recall:  [employer] of Knowledge:  Fair  Language:  Fair  Akathisia:  No  Handed:  Right  AIMS (if indicated):     Assets:  Communication Skills Desire for Improvement  ADL's:  Intact  Cognition:  WNL  Sleep:  Number of Hours: 6.25    MDD (major depressive disorder), recurrent severe, without psychosis ([place]) unstable   Will continue today 06/25/16 plan as below except where it is noted.   Treatment Plan Summary: Daily contact with patient to assess and evaluate symptoms and progress in treatment, Medication management and Plan see below   Increased Effexor to 150 mg po daily for affective sx. Will increase Seroquel to 75 mg po qhs for mood sx/insomnia. Continue Neurontin 800 mg PO tid for anxiety sx. Continue COWS/Clonidine protocol for opioid withdrawal sx. Labs reviewed - tsh - wnl, urine pregnancy test - negative, lipid panel - abnormal - diet management,iron panel - wnl , vitamin b12 - wnl . EKG reviewed qtc wnl. Pt motivated to go to an [facility] , [place] will  continue to work on disposition.    Dajon Rowe, MD 06/25/2016, 3:44 PM

## 2016-06-25 NOTE — Progress Notes (Signed)
Recreation Therapy Notes  Animal-Assisted Activity (AAA) Program Checklist/Progress Notes Patient Eligibility Criteria Checklist & Daily Group note for Rec TxIntervention  Date: 05/29.2018 Time: 2:55pm Location: [address]    AAA/T Program Assumption of Risk Form signed by Patient/ or Parent Legal Guardian No  Behavioral Response: Did not attend.    Clinical Observations/Feedback: Patient discussed with MD for appropriateness in pet therapy session. Both LRT and MD agree patient is appropriate for participation. Patient offered participation in session, however declined invitation to participate in pet therapy.     Donovan Kail, Recreational Therapy Intern       Donovan Kail 06/25/2016 3:33 PM

## 2016-06-25 NOTE — Progress Notes (Signed)
Recreation Therapy Notes  06/24/16  1053:  LRT went to meet with pt to complete assessment.  Pt stated she wanted to complete it later.  LRT will follow up with pt.    [name], LRT/CTRS       Ria Comment, Rilla Buckman A 06/25/2016 3:12 PM

## 2016-06-25 NOTE — Progress Notes (Signed)
Recreation Therapy Notes  Date: 06/25/16 Time: 1000 Location: [address]  Group Topic: Coping Skills  Goal Area(s) Addresses:  Patients will be able to identify positive coping skills. Patients will be able to identify benefits of using coping skills post d/c.  Intervention: Mindmap, pencils  Activity: Mindmap.  Patients were given a blank copy of a mindmap.  LRT and patients filled in the first eight boxes together with various situations that require coping skills (bills, loss, anger, medication, family, surgery, relapse and stress.  Patients were to then identify coping skills that can be used to deal with these situations.  LRT would then fill in the coping skills on the board so patients could fill in the blanks on their sheets.  Education: Radiographer, therapeutic, Dentist.   Education Outcome: Acknowledges understanding/In group clarification offered/Needs additional education.   Clinical Observations/Feedback  Pt did not attend group.   [name], LRT/CTRS         [name] A 06/25/2016 12:09 PM

## 2016-06-25 NOTE — Progress Notes (Signed)
Haley Brady had been up and visible in milieu this evening, did attend evening group activity and did have visit from parents earlier in the evening, She reports feeling better than when she came in but reports some on-going difficulties with sleep. She spoke about how she has a hard time shutting her mind down and has racing thoughts that make it hard to sleep. She did endorse some anxiety but no acute signs or symptoms of any withdrawal.. An was able to receive all bedtime medications without incident this evening. A. Support and encouragement provided. R. Safety maintained, will continue to monitor.

## 2016-06-25 NOTE — Progress Notes (Signed)
Adult Psychoeducational Group Note  Date:  06/25/2016 Time:  8:38 PM  Group Topic/Focus:  Wrap-Up Group:   The focus of this group is to help patients review their daily goal of treatment and discuss progress on daily workbooks.  Participation Level:  Active  Participation Quality:  Appropriate  Affect:  Appropriate  Cognitive:  Alert  Insight: Appropriate  Engagement in Group:  Engaged  Modes of Intervention:  Discussion  Additional Comments:  Patient's goal for today was to get some rest. Patient me goal.    Haley Brady L Haley Brady 06/25/2016, 8:38 PM

## 2016-06-25 NOTE — Progress Notes (Signed)
Donalynn Furlong had been up and visible in milieu, did not attend or participate in evening group activity, however has had limited interaction this evening. Shalaina has appeared depressed this evening, reported that she has been feeling ok but did endorse some anxiety this evening. Remell did not display any overt signs or symptoms of withdrawal and was able to receive bedtime medications without incident. A. Support and encouragement provided. R. Safety maintained, will continue to monitor.

## 2016-06-26 LAB — PROLACTIN: PROLACTIN: 22.5 ng/mL (ref 4.8–23.3)

## 2016-06-26 LAB — HEMOGLOBIN A1C
Hgb A1c MFr Bld: 5.3 % (ref 4.8–5.6)
Mean Plasma Glucose: 105 mg/dL

## 2016-06-26 MED ORDER — GABAPENTIN 400 MG PO CAPS
400.0000 mg | ORAL_CAPSULE | Freq: Every day | ORAL | Status: DC
  Administered 2016-06-26 – 2016-06-30 (×5): 400 mg via ORAL
  Filled 2016-06-26 (×6): qty 1

## 2016-06-26 MED ORDER — GABAPENTIN 400 MG PO CAPS
800.0000 mg | ORAL_CAPSULE | ORAL | Status: DC
Start: 2016-06-27 — End: 2016-07-01
  Administered 2016-06-27 – 2016-07-01 (×9): 800 mg via ORAL
  Filled 2016-06-26 (×11): qty 2

## 2016-06-26 MED ORDER — QUETIAPINE FUMARATE 100 MG PO TABS
100.0000 mg | ORAL_TABLET | Freq: Every day | ORAL | Status: DC
  Administered 2016-06-26: 100 mg via ORAL
  Filled 2016-06-26 (×3): qty 1

## 2016-06-26 NOTE — Progress Notes (Signed)
Recreation Therapy Notes  Date: 06/26/16 Time: 1000 Location: [address] Day[address]  Group Topic: Wellness  Goal Area(s) Addresses:  Patient will define components of whole wellness. Patient will verbalize benefit of whole wellness.  Intervention:  Chairs, beach ball  Activity:  Keep It Chartered certified accountant.  Patients were seated in a circle.  Patients were to pass the ball back and forth to each other.  LRT would count the number of hits the patients got on the ball.  Patients could bounce the ball off of the floor but the ball could not come to a complete stop.  If the ball stopped the count would start from the beginning.   Education: Wellness, Dentist.   Education Outcome: Acknowledges education/In group clarification offered/Needs additional education.   Clinical Observations/Feedback: Pt did not attend group.   [name], LRT/CTRS         [name] A 06/26/2016 12:10 PM

## 2016-06-26 NOTE — Progress Notes (Signed)
Adult Psychoeducational Group Note  Date:  06/26/2016 Time:  8:48 PM  Group Topic/Focus:  Wrap-Up Group:   The focus of this group is to help patients review their daily goal of treatment and discuss progress on daily workbooks.  Participation Level:  Active  Participation Quality:  Appropriate  Affect:  Appropriate  Cognitive:  Alert  Insight: Appropriate  Engagement in Group:  Engaged  Modes of Intervention:  Discussion  Additional Comments:  Patient's goal for today was to get out of bed. Patient stated that she slept most of the day.   Shakirra Buehler L Ryu Cerreta 06/26/2016, 8:48 PM

## 2016-06-26 NOTE — Tx Team (Signed)
Interdisciplinary Treatment and Diagnostic Plan Update  06/26/2016 Time of Session: 8:42 AM  Haley Brady MRN: [id number]  Principal Diagnosis: MDD (major depressive disorder), recurrent severe, without psychosis ([place])  Secondary Diagnoses: Principal Problem:   MDD (major depressive disorder), recurrent severe, without psychosis ([place]) Active Problems:   OCD (obsessive compulsive disorder)   Cocaine use disorder, severe, dependence ([place])   Opioid use disorder, severe, dependence ([place])   Cannabis use disorder, severe, dependence ([place])   Stimulant abuse   Current Medications:  Current Facility-Administered Medications  Medication Dose Route Frequency Provider Last Rate Last Dose  . acetaminophen (TYLENOL) tablet 650 mg  650 mg Oral Q6H PRN Ethelene Hal, NP      . alum & mag hydroxide-simeth (MAALOX/MYLANTA) 200-200-20 MG/5ML suspension 30 mL  30 mL Oral Q4H PRN Ethelene Hal, NP      . cloNIDine (CATAPRES) tablet 0.1 mg  0.1 mg Oral QID Ethelene Hal, NP   0.1 mg at 06/26/16 0830   Followed by  . cloNIDine (CATAPRES) tablet 0.1 mg  0.1 mg Oral BH-qamhs Ethelene Hal, NP       Followed by  . [START ON 06/29/2016] cloNIDine (CATAPRES) tablet 0.1 mg  0.1 mg Oral QAC breakfast Ethelene Hal, NP      . dicyclomine (BENTYL) tablet 20 mg  20 mg Oral Q6H PRN Ethelene Hal, NP      . gabapentin (NEURONTIN) capsule 800 mg  800 mg Oral TID Ethelene Hal, NP   800 mg at 06/26/16 0830  . hydrOXYzine (ATARAX/VISTARIL) tablet 25 mg  25 mg Oral Q6H PRN Ethelene Hal, NP   25 mg at 06/25/16 2026  . loperamide (IMODIUM) capsule 2-4 mg  2-4 mg Oral PRN Ethelene Hal, NP      . magnesium hydroxide (MILK OF MAGNESIA) suspension 30 mL  30 mL Oral Daily PRN Ethelene Hal, NP      . methocarbamol (ROBAXIN) tablet 500 mg  500 mg Oral Q8H PRN Ethelene Hal, NP      . naproxen (NAPROSYN) tablet 500 mg  500 mg Oral BID PRN  Ethelene Hal, NP   500 mg at 06/25/16 2026  . nicotine (NICODERM CQ - dosed in mg/24 hours) patch 21 mg  21 mg Transdermal Daily Ethelene Hal, NP      . ondansetron (ZOFRAN-ODT) disintegrating tablet 4 mg  4 mg Oral Q6H PRN Ethelene Hal, NP      . pantoprazole (PROTONIX) EC tablet 40 mg  40 mg Oral BID Ethelene Hal, NP   40 mg at 06/26/16 0830  . QUEtiapine (SEROQUEL) tablet 25 mg  25 mg Oral TID PRN Ursula Alert, MD      . QUEtiapine (SEROQUEL) tablet 75 mg  75 mg Oral QHS Eappen, Saramma, MD   75 mg at 06/25/16 2025  . traZODone (DESYREL) tablet 50 mg  50 mg Oral QHS PRN Ursula Alert, MD   50 mg at 06/25/16 2025  . venlafaxine XR (EFFEXOR-XR) 24 hr capsule 150 mg  150 mg Oral Daily Eappen, Saramma, MD   150 mg at 06/26/16 0830    PTA Medications: Prescriptions Prior to Admission  Medication Sig Dispense Refill Last Dose  . buPROPion (WELLBUTRIN XL) 300 MG 24 hr tablet Take 1 tablet (300 mg total) by mouth daily. 30 tablet 0 06/22/2016 at Unknown time  . gabapentin (NEURONTIN) 400 MG capsule Take 2 capsules (800 mg total) by mouth  3 (three) times daily. 180 capsule 0 06/22/2016 at Unknown time  . hydrOXYzine (ATARAX/VISTARIL) 25 MG tablet Take 1 tablet (25 mg total) by mouth every 6 (six) hours as needed for anxiety. (Patient taking differently: Take 75 mg by mouth 4 (four) times daily as needed for anxiety. ) 30 tablet 0 06/22/2016 at Unknown time  . nicotine (NICODERM CQ - DOSED IN MG/24 HOURS) 21 mg/24hr patch Place 1 patch (21 mg total) onto the skin daily. 28 patch 0 06/21/2016 at Unknown time  . omeprazole (PRILOSEC) 40 MG capsule Take 40 mg by mouth 2 (two) times daily.    06/22/2016 at Unknown time  . pantoprazole (PROTONIX) 40 MG tablet Take 1 tablet (40 mg total) by mouth 2 (two) times daily. (Patient not taking: Reported on 06/22/2016) 60 tablet 0 Not Taking at Unknown time  . traZODone (DESYREL) 100 MG tablet Take 1 tablet (100 mg total) by mouth at  bedtime. 30 tablet 0 06/21/2016 at Unknown time  . venlafaxine XR (EFFEXOR-XR) 75 MG 24 hr capsule Take 1 capsule (75 mg total) by mouth daily. 30 capsule 0 06/22/2016 at Unknown time    Treatment Modalities: Medication Management, Group therapy, Case management,  1 to 1 session with clinician, Psychoeducation, Recreational therapy.   Physician Treatment Plan for Primary Diagnosis: MDD (major depressive disorder), recurrent severe, without psychosis ([place]) Long Term Goal(s): Improvement in symptoms so as ready for discharge  Short Term Goals: Ability to verbalize feelings will improve Ability to disclose and discuss suicidal ideas Compliance with prescribed medications will improve Ability to identify triggers associated with substance abuse/mental health issues will improve Ability to verbalize feelings will improve Ability to disclose and discuss suicidal ideas Compliance with prescribed medications will improve Ability to identify triggers associated with substance abuse/mental health issues will improve  Medication Management: Evaluate patient's response, side effects, and tolerance of medication regimen.  Therapeutic Interventions: 1 to 1 sessions, Unit Group sessions and Medication administration.  Evaluation of Outcomes: Not Progressing  Physician Treatment Plan for Secondary Diagnosis: Principal Problem:   MDD (major depressive disorder), recurrent severe, without psychosis ([place]) Active Problems:   OCD (obsessive compulsive disorder)   Cocaine use disorder, severe, dependence ([place])   Opioid use disorder, severe, dependence ([place])   Cannabis use disorder, severe, dependence ([place])   Stimulant abuse   Long Term Goal(s): Improvement in symptoms so as ready for discharge  Short Term Goals: Ability to verbalize feelings will improve Ability to disclose and discuss suicidal ideas Compliance with prescribed medications will improve Ability to identify triggers associated with  substance abuse/mental health issues will improve Ability to verbalize feelings will improve Ability to disclose and discuss suicidal ideas Compliance with prescribed medications will improve Ability to identify triggers associated with substance abuse/mental health issues will improve  Medication Management: Evaluate patient's response, side effects, and tolerance of medication regimen.  Therapeutic Interventions: 1 to 1 sessions, Unit Group sessions and Medication administration.  Evaluation of Outcomes: Not Progressing   RN Treatment Plan for Primary Diagnosis: MDD (major depressive disorder), recurrent severe, without psychosis ([place]) Long Term Goal(s): Knowledge of disease and therapeutic regimen to maintain health will improve  Short Term Goals: Ability to disclose and discuss suicidal ideas and Compliance with prescribed medications will improve  Medication Management: RN will administer medications as ordered by provider, will assess and evaluate patient's response and provide education to patient for prescribed medication. RN will report any adverse and/or side effects to prescribing provider.  Therapeutic Interventions: 1 on  1 counseling sessions, Psychoeducation, Medication administration, Evaluate responses to treatment, Monitor vital signs and CBGs as ordered, Perform/monitor CIWA, COWS, AIMS and Fall Risk screenings as ordered, Perform wound care treatments as ordered.  Evaluation of Outcomes: Progressing    Recreational Therapy Treatment Plan for Primary Diagnosis: MDD (major depressive disorder), recurrent severe, without psychosis ([place]) Long Term Goal(s): Patient will participate in recreation therapy treatment in at least 2 group sessions without prompting from LRT  Short Term Goals: Patient will be able to identify at least 5 coping skills for admitting diagnosis by conclusion of recreation therapy treatment  Treatment Modalities: Group and Pet Therapy  Therapeutic  Interventions: Psychoeducation  Evaluation of Outcomes: Progressing   LCSW Treatment Plan for Primary Diagnosis: MDD (major depressive disorder), recurrent severe, without psychosis ([place]) Long Term Goal(s): Safe transition to appropriate next level of care at discharge, Engage patient in therapeutic group addressing interpersonal concerns.  Short Term Goals: Engage patient in aftercare planning with referrals and resources  Therapeutic Interventions: Assess for all discharge needs, 1 to 1 time with Social worker, Explore available resources and support systems, Assess for adequacy in community support network, Educate family and significant other(s) on suicide prevention, Complete Psychosocial Assessment, Interpersonal group therapy.  Evaluation of Outcomes: Not Progressing    Progress in Treatment: Attending groups: No Participating in groups: No Taking medication as prescribed: Yes Toleration medication: Yes, no side effects reported at this time Family/Significant other contact made: No Patient understands diagnosis: Yes AEB asking for help with racing thoughts Discussing patient identified problems/goals with staff: Yes Medical problems stabilized or resolved: Yes Denies suicidal/homicidal ideation: Yes Issues/concerns per patient self-inventory: None Other: N/A  New problem(s) identified: Spending her days in bed.  Not attending groups.  New Short Term/Long Term Goal(s): None identified at this time.   Discharge Plan or Barriers: Pt has asked for list of [facility]  Reason for Continuation of Hospitalization: Anxiety Racing thoughts Depression Medication stabilization Suicidal ideation   Estimated Length of Stay: 3-5 days  Attendees: Patient: 06/26/2016  8:42 AM  Physician: Ursula Alert, MD 06/26/2016  8:42 AM  Nursing: Otilio Carpen, RN 06/26/2016  8:42 AM  RN Care Manager: Lars Pinks, RN 06/26/2016  8:42 AM  Social Worker: Ripley Fraise 06/26/2016  8:42 AM   Recreational Therapist: [name], LRT/CTRS 06/26/2016  8:42 AM  Other: Norberto Sorenson 06/26/2016  8:42 AM  Other:  06/26/2016  8:42 AM    Scribe for Treatment Team:  [name] LCSW 06/26/2016 8:42 AM

## 2016-06-26 NOTE — [facility] Group Notes (Signed)
[place] LCSW Group Therapy  06/26/2016 1:15 pm  Type of Therapy: Process Group Therapy  Participation Level:  Active  Participation Quality:  Appropriate  Affect:  Flat  Cognitive:  Oriented  Insight:  Improving  Engagement in Group:  Limited  Engagement in Therapy:  Limited  Modes of Intervention:  Activity, Clarification, Education, Problem-solving and Support  Summary of Progress/Problems: Today's group addressed the issue of overcoming obstacles.  Patients were asked to identify their biggest obstacle post d/c that stands in the way of their on-going success, and then problem solve as to how to manage this. Invited.  Chose to not attend.  Trish Mage 06/26/2016   4:37 PM

## 2016-06-26 NOTE — [facility] Suicide Risk Assessment (Signed)
[place] INPATIENT:  Family/Significant Other Suicide Prevention Education  Suicide Prevention Education:  Patient Refusal for Family/Significant Other Suicide Prevention Education: The patient Haley Brady has refused to provide written consent for family/significant other to be provided Family/Significant Other Suicide Prevention Education during admission and/or prior to discharge.  Physician notified.  [place] 06/26/2016, 8:44 AM

## 2016-06-26 NOTE — Progress Notes (Signed)
[facility] MD Progress Note  06/26/2016 4:23 PM Haley Brady  MRN:  [id number] Subjective:  Pt states " I have a lot of racing thoughts.'   Objective:Patient seen and chart reviewed.Discussed patient with treatment team.  Pt today seen as isolative , reports racing thoughts as well as sleep issues.Continues to be motivated to go to an [employer] , but limited overall participation in milieu. Will continue to increase her medications.      Principal Problem: MDD (major depressive disorder), recurrent severe, without psychosis ([place]) Diagnosis:   Patient Active Problem List   Diagnosis Date Noted  . OCD (obsessive compulsive disorder) [F42.9] 06/24/2016  . Cocaine use disorder, severe, dependence ([place]) [F14.20] 06/24/2016  . Opioid use disorder, severe, dependence ([place]) [F11.20] 06/24/2016  . Cannabis use disorder, severe, dependence ([place]) [F12.20] 06/24/2016  . 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 Depression [F32.9]   . Polysubstance dependence including opioid drug with daily use ([place]) [F19.20] 04/12/2016  . MDD (major depressive disorder), recurrent severe, without psychosis ([place]) [F33.2] 04/11/2016  . Dyspepsia [R10.13] 08/26/2014  . Chronic nausea [R11.0] 05/28/2011  . URINALYSIS, ABNORMAL [R82.99] 08/06/2006  . MRSA INFECTION [B95.7] 05/23/2006  . OBESITY NOS [E66.9] 05/23/2006  . Anxiety [F41.9] 05/23/2006  . MIGRAINE HEADACHE [G43.909] 05/23/2006  . GERD [K21.9] 05/23/2006  . LOW BACK PAIN, CHRONIC [M54.5] 05/23/2006   Total Time spent with patient: 25 minutes  Past Psychiatric History: Please see H&P.   Past Medical History:  Past Medical History:  Diagnosis Date  . Abnormal pap   . Asymptomatic varicose veins   . Chronic back pain   . Chronic nausea   . DDD (degenerative disc disease)   . Depression   . Esophageal reflux   . Ingrowing nail   . Migraines   . Panic disorder without agoraphobia   . Renal disorder   . Vaginal Pap smear, abnormal     Past  Surgical History:  Procedure Laterality Date  . BRAVO [place] STUDY  05/2011   on nexium 40mg  daily 217 episodes of reflux, Demeester score day 1: 51.9 and Day 2: 24.8.   Marland Kitchen CHOLECYSTECTOMY    . ESOPHAGOGASTRODUODENOSCOPY  06/11/2011    SMALL HIATAL HERNIA/Moderate gastritis/  BURNING CHESTPAIN: GERD, NERD, OR NON-ULCER DYSPEPSIA/ Nl esophagus  . TONSILLECTOMY     Family History:  Family History  Problem Relation Age of Onset  . Heart disease Mother   . Pancreatic cancer Father 53   Family Psychiatric  History: Please see H&P.  Social History: Please see H&P.  History  Alcohol Use  . Yes    Comment: occasionally     History  Drug Use  . Types: Cocaine, Marijuana, Methamphetamines    Social History   Social History  . Marital status: Single    Spouse name: N/A  . Number of children: 1  . Years of education: N/A   Occupational History  . [employer]   Social History Main Topics  . Smoking status: Heavy Tobacco Smoker    Packs/day: 0.50    Types: Cigarettes  . Smokeless tobacco: Never Used     Comment: pt reports '4 cigarettes per day."  . Alcohol use Yes     Comment: occasionally  . Drug use: Yes    Types: Cocaine, Marijuana, Methamphetamines  . Sexual activity: Yes    Birth control/ protection: None   Other Topics Concern  . None   Social History Narrative   Lives w/ daughter-5  Additional Social History:                         Sleep: Poor  Appetite:  Fair  Current Medications: Current Facility-Administered Medications  Medication Dose Route Frequency Provider Last Rate Last Dose  . acetaminophen (TYLENOL) tablet 650 mg  650 mg Oral Q6H PRN Ethelene Hal, NP      . alum & mag hydroxide-simeth (MAALOX/MYLANTA) 200-200-20 MG/5ML suspension 30 mL  30 mL Oral Q4H PRN Ethelene Hal, NP      . cloNIDine (CATAPRES) tablet 0.1 mg  0.1 mg Oral BH-qamhs Ethelene Hal, NP       Followed by  . [START ON  06/29/2016] cloNIDine (CATAPRES) tablet 0.1 mg  0.1 mg Oral QAC breakfast Ethelene Hal, NP      . dicyclomine (BENTYL) tablet 20 mg  20 mg Oral Q6H PRN Ethelene Hal, NP      . gabapentin (NEURONTIN) capsule 400 mg  400 mg Oral QHS Ursula Alert, MD      . Derrill Memo ON 06/27/2016] gabapentin (NEURONTIN) capsule 800 mg  800 mg Oral [id number] Elizebath Wever, MD      . hydrOXYzine (ATARAX/VISTARIL) tablet 25 mg  25 mg Oral Q6H PRN Ethelene Hal, NP   25 mg at 06/25/16 2026  . loperamide (IMODIUM) capsule 2-4 mg  2-4 mg Oral PRN Ethelene Hal, NP      . magnesium hydroxide (MILK OF MAGNESIA) suspension 30 mL  30 mL Oral Daily PRN Ethelene Hal, NP      . methocarbamol (ROBAXIN) tablet 500 mg  500 mg Oral Q8H PRN Ethelene Hal, NP      . naproxen (NAPROSYN) tablet 500 mg  500 mg Oral BID PRN Ethelene Hal, NP   500 mg at 06/25/16 2026  . nicotine (NICODERM CQ - dosed in mg/24 hours) patch 21 mg  21 mg Transdermal Daily Ethelene Hal, NP      . ondansetron (ZOFRAN-ODT) disintegrating tablet 4 mg  4 mg Oral Q6H PRN Ethelene Hal, NP      . pantoprazole (PROTONIX) EC tablet 40 mg  40 mg Oral BID Ethelene Hal, NP   40 mg at 06/26/16 0830  . QUEtiapine (SEROQUEL) tablet 100 mg  100 mg Oral QHS Geriann Lafont, MD      . QUEtiapine (SEROQUEL) tablet 25 mg  25 mg Oral TID PRN Ursula Alert, MD      . traZODone (DESYREL) tablet 50 mg  50 mg Oral QHS PRN Ursula Alert, MD   50 mg at 06/25/16 2025  . venlafaxine XR (EFFEXOR-XR) 24 hr capsule 150 mg  150 mg Oral Daily Akilah Cureton, MD   150 mg at 06/26/16 0830    Lab Results:  Results for orders placed or performed during the hospital encounter of 06/23/16 (from the past 48 hour(s))  Pregnancy, urine     Status: None   Collection Time: 06/24/16  5:34 PM  Result Value Ref Range   Preg Test, Ur NEGATIVE NEGATIVE    Comment:        THE SENSITIVITY OF THIS METHODOLOGY IS >20  mIU/mL. Performed at [facility], [place] [address] [place], [place] [address]   TSH     Status: None   Collection Time: 06/25/16  6:15 AM  Result Value Ref Range   TSH 1.327 0.350 - 4.500 uIU/mL    Comment: Performed by  a 3rd Generation assay with a functional sensitivity of <=0.01 uIU/mL. Performed at [facility], [place] [address] [place], [place] [address]   Lipid panel     Status: Abnormal   Collection Time: 06/25/16  6:15 AM  Result Value Ref Range   Cholesterol 193 0 - 200 mg/dL   Triglycerides 232 (H) <150 mg/dL   HDL 46 >40 mg/dL   Total CHOL/HDL Ratio 4.2 RATIO   VLDL 46 (H) 0 - 40 mg/dL   LDL Cholesterol 101 (H) 0 - 99 mg/dL    Comment:        Total Cholesterol/HDL:CHD Risk Coronary Heart Disease Risk Table                     Men   Women  1/2 Average Risk   3.4   3.3  Average Risk       5.0   4.4  2 X Average Risk   9.6   7.1  3 X Average Risk  23.4   11.0        Use the calculated Patient Ratio above and the CHD Risk Table to determine the patient's CHD Risk.        ATP III CLASSIFICATION (LDL):  <100     mg/dL   Optimal  100-129  mg/dL   Near or Above                    Optimal  130-159  mg/dL   Borderline  160-189  mg/dL   High  >190     mg/dL   Very High Performed at [place] [address] [place], [place] [address]   Hemoglobin A1c     Status: None   Collection Time: 06/25/16  6:15 AM  Result Value Ref Range   Hgb A1c MFr Bld 5.3 4.8 - 5.6 %    Comment: (NOTE)         Pre-diabetes: 5.7 - 6.4         Diabetes: >6.4         Glycemic control for adults with diabetes: <7.0    Mean Plasma Glucose 105 mg/dL    Comment: (NOTE) Performed At: [facility] [place], [place] [id number] Lindon Romp MD [id number] Performed at [facility], [place] [address], [place], [place] [address]   Prolactin     Status: None   Collection Time: 06/25/16  6:15 AM  Result  Value Ref Range   Prolactin 22.5 4.8 - 23.3 ng/mL    Comment: (NOTE) Performed At: [facility] [place], [place] [id number] Lindon Romp MD [id number] Performed at [facility], [place] [address], [place], [place] [address]   Iron and TIBC     Status: Abnormal   Collection Time: 06/25/16  6:15 AM  Result Value Ref Range   Iron 30 28 - 170 ug/dL   TIBC 361 250 - 450 ug/dL   Saturation Ratios 8 (L) 10.4 - 31.8 %   UIBC 331 ug/dL    Comment: Performed at [facility], [place] [address] [place], [place] [address]  Ferritin     Status: None   Collection Time: 06/25/16  6:15 AM  Result Value Ref Range   Ferritin 13 11 - 307 ng/mL    Comment: Performed at [facility], [place] [address], [place], [name] [address]  Vitamin B12  Status: None   Collection Time: 06/25/16  6:15 AM  Result Value Ref Range   Vitamin B-12 749 180 - 914 pg/mL    Comment: (NOTE) This assay is not validated for testing neonatal or myeloproliferative syndrome specimens for Vitamin B12 levels. Performed at [facility], [place] [address] [place], [place] [address]   Folate     Status: None   Collection Time: 06/25/16  6:15 AM  Result Value Ref Range   Folate 13.4 >5.9 ng/mL    Comment: Performed at [place] [address], [place], [place] [address]    Blood Alcohol level:  Lab Results  Component Value Date   [facility] [facility] <5 06/22/2016   ETH <5 96/29/5284    Metabolic Disorder Labs: Lab Results  Component Value Date   HGBA1C 5.3 06/25/2016   MPG 105 06/25/2016   Lab Results  Component Value Date   PROLACTIN 22.5 06/25/2016   Lab Results  Component Value Date   CHOL 193 06/25/2016   TRIG 232 (H) 06/25/2016   HDL 46 06/25/2016   CHOLHDL 4.2 06/25/2016   VLDL 46 (H) 06/25/2016   LDLCALC 101 (H) 06/25/2016    Physical Findings: AIMS: Facial and Oral Movements Muscles of Facial Expression: None, normal Lips and Perioral Area:  None, normal Jaw: None, normal Tongue: None, normal,Extremity Movements Upper (arms, wrists, hands, fingers): None, normal Lower (legs, knees, ankles, toes): None, normal, Trunk Movements Neck, shoulders, hips: None, normal, Overall Severity Severity of abnormal movements (highest score from questions above): None, normal Incapacitation due to abnormal movements: None, normal Patient's awareness of abnormal movements (rate only patient's report): No Awareness, Dental Status Current problems with teeth and/or dentures?: No Does patient usually wear dentures?: No  CIWA:    COWS:  COWS Total Score: 1  Musculoskeletal: Strength & Muscle Tone: within normal limits Gait & Station: seen in bed Patient leans: N/A  Psychiatric Specialty Exam: Physical Exam  Nursing note and vitals reviewed.   Review of Systems  Psychiatric/Behavioral: Positive for depression and substance abuse. The patient is nervous/anxious and has insomnia.   All other systems reviewed and are negative.   Blood pressure 116/70, pulse 73, temperature 98.2 F (36.8 C), temperature source Oral, resp. rate 20, height 5\' 4"  (1.626 m), weight 117.9 kg (260 lb), last menstrual period 06/20/2016.Body mass index is 44.63 kg/m.  General Appearance: Guarded  Eye Contact:  Minimal  Speech:  Slow  Volume:  Decreased  Mood:  Anxious and Depressed  Affect:  Depressed  Thought Process:  Goal Directed and Descriptions of Associations: Intact  Orientation:  Full (Time, Place, and Person)  Thought Content:  Rumination  Suicidal Thoughts:  No  Homicidal Thoughts:  No  Memory:  Immediate;   Fair Recent;   Fair Remote;   Fair  Judgement:  Impaired  Insight:  Shallow  Psychomotor Activity:  Decreased  Concentration:  Concentration: Poor and Attention Span: Poor  Recall:  [employer] of Knowledge:  Fair  Language:  Fair  Akathisia:  No  Handed:  Right  AIMS (if indicated):     Assets:  Communication Skills Desire for  Improvement  ADL's:  Intact  Cognition:  WNL  Sleep:  Number of Hours: 6.75    MDD (major depressive disorder), recurrent severe, without psychosis ([place]) unstable  Will continue today 06/26/16  plan as below except where it is noted.    .   Treatment Plan Summary:patient with depression, racing thoughts, substance abuse ,  continues to have sleep issues , is isolative , continue treatment. Daily contact with patient to assess and evaluate symptoms and progress in treatment, Medication management and Plan see below   Increased Effexor to 150 mg po daily for affective sx. Will increase seroquel to 100 mg po qhs for mood sx/insomnia. Will change Neurontin to 800 mg po bid and 400 mg po qhs to reduce her drowsiness during the day. Continue COWS/Clonidine protocol for opioid withdrawal sx. Labs reviewed - tsh - wnl, urine pregnancy test - negative, lipid panel - abnormal - diet management,iron panel - wnl , vitamin b12 - wnl . EKG reviewed qtc wnl. Pt motivated to go to an [employer] , [place] will continue to work on disposition.    Shawny Borkowski, MD 06/26/2016, 4:23 PM

## 2016-06-26 NOTE — Progress Notes (Signed)
Recreation Therapy Notes  INPATIENT RECREATION THERAPY ASSESSMENT  Patient Details Name: Haley Brady MRN: [id number] DOB: [birth year] Today's Date: 06/26/2016  Patient Stressors: Family, Other (Comment) (Homeless)  Pt stated she was here because she felt she was losing it and for anxiety. Pt stated worrying about her daughter is a stressor.  Coping Skills:   Isolate, Substance Abuse, Avoidance, Art/Dance, Talking, Music  Personal Challenges: Anger, Communication, Concentration, Decision-Making, Expressing Yourself, Problem-Solving, Relationships, Self-Esteem/Confidence, Social Interaction, Stress Management, Substance Abuse, Time Management, Trusting Others  Leisure Interests (2+):  Art - Coloring, Art - Paint, Social - Family, Individual - Other (Comment), Crafts - Other (Comment) (Watch movies, arts and crafts projects)  Futures trader Resources:  No  Patient Strengths:  Determined will, get along with others  Patient Identified Areas of Improvement:  Decision-making, trusting others  Current Recreation Participation:  3 times a week  Patient Goal for Hospitalization:  "Get myself together and get mind right"  [place] of Residence:  [place] of Residence:  [place]  Current [place] (including self-harm):  No  Current HI:  No  Consent to Intern Participation: N/A   [name], LRT/CTRS  [name] A 06/26/2016, 2:53 PM

## 2016-06-26 NOTE — Progress Notes (Signed)
Nursing Note 06/26/2016 [id number]  Data Reports sleeping fair with/without PRN sleep med.  Rates depression 7/10, hopelessness 8/10, and anxiety 0/10. Affect blunted but appropriate.  Denies HI, SI, AVH.   Action Spoke with patient 1:1, nurse offered support to patient throughout shift..  Continues to be monitored on 15 minute checks for safety.  Response Remains safe and appropriate on unit.

## 2016-06-27 MED ORDER — QUETIAPINE FUMARATE 100 MG PO TABS
100.0000 mg | ORAL_TABLET | Freq: Two times a day (BID) | ORAL | Status: DC
  Administered 2016-06-27 – 2016-06-28 (×2): 100 mg via ORAL
  Filled 2016-06-27 (×4): qty 1

## 2016-06-27 MED ORDER — BUPROPION HCL ER (XL) 300 MG PO TB24
300.0000 mg | ORAL_TABLET | Freq: Every day | ORAL | Status: DC
  Administered 2016-06-27 – 2016-06-28 (×2): 300 mg via ORAL
  Filled 2016-06-27 (×3): qty 1

## 2016-06-27 NOTE — [facility] Group Notes (Signed)
[facility] Mental Health Association Group Therapy  06/27/2016 , 11:34 AM    Type of Therapy:  Mental Health Association Presentation  Participation Level:  Active  Participation Quality:  Attentive  Affect:  Blunted  Cognitive:  Oriented  Insight:  Limited  Engagement in Therapy:  Engaged  Modes of Intervention:  Discussion, Education and Socialization  Summary of Progress/Problems:  Haley Brady from [place] came to present his recovery story and play the guitar.  Stayed the entire time, engaged throughout.  [name] B 06/27/2016 , 11:34 AM

## 2016-06-27 NOTE — Progress Notes (Signed)
D: Presents with flat affect and depressed mood on initial approach this AM while in bed. Reports poor sleep, fair appetite, low energy and poor concentration level. Pt did not attend AM group as scheduled; per pt "I'm just tired, I did not sleep well last night". Pt rates her depression 8/10, hopelessness 8/10 and anxiety 7/10.  A: All medications administered as per MD's order and effects monitored. Support and availability provided to pt. Encouraged pt to comply with treatment regimen including unit groups. Q 15 safety checks remains effective without outburst or self harm gestures thus far this shift.  R: Pt receptive to care. Continues to take her medications without issues. OOB, animated and more interactive with peers and staff this evening. Remains safe on and off unit.

## 2016-06-27 NOTE — Progress Notes (Signed)
Recreation Therapy Notes  Date: 06/27/16 Time: 1000 Location: 500 Hall Dayroom  Group Topic: Anger Management  Goal Area(s) Addresses:  Patient will identify triggers for anger.  Patient will identify physical reaction to anger.   Patient will identify benefit of using coping skills when angry.  Intervention: Worksheets, pencils  Activity: Umbrella of Emotion.  Patients were given a picture of an umbrella.  In the umbrella, patients were to identify underlying emotions to their anger.  If they were unable to name specific emotions that the anger is covering, they were to identify circumstances and situations that get them angry.  Education:Anger Management, Discharge Planning   Education Outcome: Acknowledges education/In group clarification offered/Needs additional education.   Clinical Observations/Feedback: Pt did not attend group.   [name], LRT/CTRS          [name] A 06/27/2016 12:59 PM

## 2016-06-27 NOTE — Progress Notes (Signed)
[facility] MD Progress Note  06/27/2016 12:09 PM Haley Brady  MRN:  [id number]   Subjective:  Patient stated "I'm having racing thoughts at nighttime and depressed but not given my medication Wellbutrin and had taken Effexor for my OCD. Patient has difficulty to sleep at nighttime and feeling tired during daytime so she has been sleeping in her room and staff participating with the groups."    Objective:Patient seen and chart reviewed.Discussed patient with treatment team. Patient was seen staying in her room and sleeping because he is being feeling depressed, no energy, isolating, don't file getting out of the bed and not able to participate in milieu therapy or group therapeutic activities. Patient Stated that her depression is 8 out of 10, anxiety 7 out of 10, 10 being the worst symptom. Patient reported her medication Wellbutrin need to be restarted and also adjust her medication Seroquel for help with her racing thoughts.Patient is continues to be motivated to go to an [employer]. We'll increase her Seroquel 100 mg twice daily and also reason introducer medication Wellbutrin XL 300 mg daily morning. Patient has no suicidal/homicidal ideation and does not appear to be responding to the internal stimuli and denies any cravings.   Principal Problem: MDD (major depressive disorder), recurrent severe, without psychosis ([place]) Diagnosis:   Patient Active Problem List   Diagnosis Date Noted  . OCD (obsessive compulsive disorder) [F42.9] 06/24/2016  . Cocaine use disorder, severe, dependence ([place]) [F14.20] 06/24/2016  . Opioid use disorder, severe, dependence ([place]) [F11.20] 06/24/2016  . Cannabis use disorder, severe, dependence ([place]) [F12.20] 06/24/2016  . 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 Depression [F32.9]   . Polysubstance dependence including opioid drug with daily use ([place]) [F19.20] 04/12/2016  . MDD (major depressive disorder), recurrent severe, without psychosis ([place]) [F33.2] 04/11/2016   . Dyspepsia [R10.13] 08/26/2014  . Chronic nausea [R11.0] 05/28/2011  . URINALYSIS, ABNORMAL [R82.99] 08/06/2006  . MRSA INFECTION [B95.7] 05/23/2006  . OBESITY NOS [E66.9] 05/23/2006  . Anxiety [F41.9] 05/23/2006  . MIGRAINE HEADACHE [G43.909] 05/23/2006  . GERD [K21.9] 05/23/2006  . LOW BACK PAIN, CHRONIC [M54.5] 05/23/2006   Total Time spent with patient: 25 minutes  Past Psychiatric History: Please see H&P.   Past Medical History:  Past Medical History:  Diagnosis Date  . Abnormal pap   . Asymptomatic varicose veins   . Chronic back pain   . Chronic nausea   . DDD (degenerative disc disease)   . Depression   . Esophageal reflux   . Ingrowing nail   . Migraines   . Panic disorder without agoraphobia   . Renal disorder   . Vaginal Pap smear, abnormal     Past Surgical History:  Procedure Laterality Date  . BRAVO [place] STUDY  05/2011   on nexium 40mg  daily 217 episodes of reflux, [place]ter score day 1: 51.9 and Day 2: 24.8.   [place] CHOLECYSTECTOMY    . ESOPHAGOGASTRODUODENOSCOPY  06/11/2011    SMALL HIATAL HERNIA/Moderate gastritis/  BURNING CHESTPAIN: GERD, NERD, OR NON-ULCER DYSPEPSIA/ Nl esophagus  . TONSILLECTOMY     Family History:  Family History  Problem Relation Age of Onset  . Heart disease Mother   . Pancreatic cancer Father 25   Family Psychiatric  History: Please see H&P.  Social History: Please see H&P.  History  Alcohol Use  . Yes    Comment: occasionally     History  Drug Use  . Types: Cocaine, Marijuana, Methamphetamines    Social History   Social  History  . Marital status: Single    Spouse name: N/A  . Number of children: 1  . Years of education: N/A   Occupational History  . [employer]   Social History Main Topics  . Smoking status: Heavy Tobacco Smoker    Packs/day: 0.50    Types: Cigarettes  . Smokeless tobacco: Never Used     Comment: pt reports '4 cigarettes per day."  . Alcohol use Yes     Comment:  occasionally  . Drug use: Yes    Types: Cocaine, Marijuana, Methamphetamines  . Sexual activity: Yes    Birth control/ protection: None   Other Topics Concern  . None   Social History Narrative   Lives w/ daughter-5   Additional Social History:                         Sleep: Poor  Appetite:  Fair  Current Medications: Current Facility-Administered Medications  Medication Dose Route Frequency Provider Last Rate Last Dose  . acetaminophen (TYLENOL) tablet 650 mg  650 mg Oral Q6H PRN Ethelene Hal, NP      . alum & mag hydroxide-simeth (MAALOX/MYLANTA) 200-200-20 MG/5ML suspension 30 mL  30 mL Oral Q4H PRN Ethelene Hal, NP      . buPROPion (WELLBUTRIN XL) 24 hr tablet 300 mg  300 mg Oral Daily Ambrose Finland, MD      . cloNIDine (CATAPRES) tablet 0.1 mg  0.1 mg Oral BH-qamhs Ethelene Hal, NP   0.1 mg at 06/27/16 8101   Followed by  . [START ON 06/29/2016] cloNIDine (CATAPRES) tablet 0.1 mg  0.1 mg Oral QAC breakfast Ethelene Hal, NP      . dicyclomine (BENTYL) tablet 20 mg  20 mg Oral Q6H PRN Ethelene Hal, NP      . gabapentin (NEURONTIN) capsule 400 mg  400 mg Oral QHS Ursula Alert, MD   400 mg at 06/26/16 2135  . gabapentin (NEURONTIN) capsule 800 mg  800 mg Oral [id number] Eappen, Saramma, MD   800 mg at 06/27/16 1201  . hydrOXYzine (ATARAX/VISTARIL) tablet 25 mg  25 mg Oral Q6H PRN Ethelene Hal, NP   25 mg at 06/25/16 2026  . loperamide (IMODIUM) capsule 2-4 mg  2-4 mg Oral PRN Ethelene Hal, NP      . magnesium hydroxide (MILK OF MAGNESIA) suspension 30 mL  30 mL Oral Daily PRN Ethelene Hal, NP      . methocarbamol (ROBAXIN) tablet 500 mg  500 mg Oral Q8H PRN Ethelene Hal, NP      . naproxen (NAPROSYN) tablet 500 mg  500 mg Oral BID PRN Ethelene Hal, NP   500 mg at 06/26/16 2138  . nicotine (NICODERM CQ - dosed in mg/24 hours) patch 21 mg  21 mg Transdermal Daily Ethelene Hal, NP      . ondansetron (ZOFRAN-ODT) disintegrating tablet 4 mg  4 mg Oral Q6H PRN Ethelene Hal, NP      . pantoprazole (PROTONIX) EC tablet 40 mg  40 mg Oral BID Ethelene Hal, NP   40 mg at 06/27/16 7510  . QUEtiapine (SEROQUEL) tablet 100 mg  100 mg Oral BID Ambrose Finland, MD      . traZODone (DESYREL) tablet 50 mg  50 mg Oral QHS PRN Ursula Alert, MD   50 mg at 06/26/16 2138  . venlafaxine XR (EFFEXOR-XR) 24 hr  capsule 150 mg  150 mg Oral Daily Eappen, Ria Clock, MD   150 mg at 06/27/16 9379    Lab Results:  No results found for this or any previous visit (from the past 48 hour(s)).  Blood Alcohol level:  Lab Results  Component Value Date   ETH <5 06/22/2016   ETH <5 02/40/9735    Metabolic Disorder Labs: Lab Results  Component Value Date   HGBA1C 5.3 06/25/2016   MPG 105 06/25/2016   Lab Results  Component Value Date   PROLACTIN 22.5 06/25/2016   Lab Results  Component Value Date   CHOL 193 06/25/2016   TRIG 232 (H) 06/25/2016   HDL 46 06/25/2016   CHOLHDL 4.2 06/25/2016   VLDL 46 (H) 06/25/2016   LDLCALC 101 (H) 06/25/2016    Physical Findings: AIMS: Facial and Oral Movements Muscles of Facial Expression: None, normal Lips and Perioral Area: None, normal Jaw: None, normal Tongue: None, normal,Extremity Movements Upper (arms, wrists, hands, fingers): None, normal Lower (legs, knees, ankles, toes): None, normal, Trunk Movements Neck, shoulders, hips: None, normal, Overall Severity Severity of abnormal movements (highest score from questions above): None, normal Incapacitation due to abnormal movements: None, normal Patient's awareness of abnormal movements (rate only patient's report): No Awareness, Dental Status Current problems with teeth and/or dentures?: No Does patient usually wear dentures?: No  CIWA:    COWS:  COWS Total Score: 0  Musculoskeletal: Strength & Muscle Tone: within normal limits Gait & Station:  seen in bed Patient leans: N/A  Psychiatric Specialty Exam: Physical Exam  Nursing note and vitals reviewed.   Review of Systems  Psychiatric/Behavioral: Positive for depression and substance abuse. The patient is nervous/anxious and has insomnia.   All other systems reviewed and are negative.   Blood pressure 110/65, pulse 69, temperature 98.3 F (36.8 C), temperature source Oral, resp. rate 20, height 5\' 4"  (1.626 m), weight 117.9 kg (260 lb), last menstrual period 06/20/2016.Body mass index is 44.63 kg/m.  General Appearance: Guarded  Eye Contact:  Minimal  Speech:  Slow  Volume:  Decreased  Mood:  Anxious and Depressed  Affect:  Depressed  Thought Process:  Goal Directed and Descriptions of Associations: Intact  Orientation:  Full (Time, Place, and Person)  Thought Content:  Rumination  Suicidal Thoughts:  No  Homicidal Thoughts:  No  Memory:  Immediate;   Fair Recent;   Fair Remote;   Fair  Judgement:  Impaired  Insight:  Shallow  Psychomotor Activity:  Decreased  Concentration:  Concentration: Poor and Attention Span: Poor  Recall:  [employer] of Knowledge:  Fair  Language:  Fair  Akathisia:  No  Handed:  Right  AIMS (if indicated):     Assets:  Communication Skills Desire for Improvement  ADL's:  Intact  Cognition:  WNL  Sleep:  Number of Hours: 5.75    MDD (major depressive disorder), recurrent severe, without psychosis ([place]) unstable  Will continue today 06/27/16  plan as below except where it is noted.  Treatment Plan Summary:patient with depression, racing thoughts, substance abuse , continues to have sleep issues , is isolative , continue treatment.  Daily contact with patient to assess and evaluate symptoms and progress in treatment, Medication management and Plan see below   Continue Effexor to 150 mg po daily for affective sx. Will increase seroquel to 100 mg po Twice a day for mood sx/insomnia. Will change Neurontin to 800 mg po bid and 400 mg  po qhs to reduce her  drowsiness during the day. We will reintroduce Wellbutrin XL 300 mg daily morning for depression Continue COWS/Clonidine protocol for opioid withdrawal sx. Labs reviewed - tsh - wnl, urine pregnancy test - negative, lipid panel - abnormal - diet management,iron panel - wnl , vitamin b12 - wnl . EKG reviewed qtc wnl. Pt motivated to go to an [employer] , [place] will continue to work on disposition.    Ambrose Finland, MD 06/27/2016, 12:09 PM

## 2016-06-27 NOTE — Progress Notes (Signed)
Pt attended karaoke group this evening.  

## 2016-06-28 MED ORDER — QUETIAPINE FUMARATE 100 MG PO TABS
100.0000 mg | ORAL_TABLET | Freq: Every day | ORAL | Status: DC
  Administered 2016-06-29 – 2016-07-01 (×3): 100 mg via ORAL
  Filled 2016-06-28 (×5): qty 1

## 2016-06-28 MED ORDER — TRAZODONE HCL 100 MG PO TABS
100.0000 mg | ORAL_TABLET | Freq: Every evening | ORAL | Status: DC | PRN
  Administered 2016-06-28 – 2016-06-30 (×3): 100 mg via ORAL
  Filled 2016-06-28 (×3): qty 1

## 2016-06-28 MED ORDER — QUETIAPINE FUMARATE 200 MG PO TABS
200.0000 mg | ORAL_TABLET | Freq: Every day | ORAL | Status: DC
  Administered 2016-06-28: 200 mg via ORAL
  Filled 2016-06-28 (×2): qty 1

## 2016-06-28 MED ORDER — BUPROPION HCL ER (XL) 150 MG PO TB24
150.0000 mg | ORAL_TABLET | Freq: Every day | ORAL | Status: DC
  Administered 2016-06-29 – 2016-07-01 (×3): 150 mg via ORAL
  Filled 2016-06-28 (×4): qty 1

## 2016-06-28 NOTE — Progress Notes (Signed)
D: Pt at the time of assessment was alert and oriented x4. Pt at the time endorsed moderate anxiety and depression. Pt could also be med seeking; was preoccupied about her Seroquel." Pt however denied pain, HI or AVH. Pt was a little restless. A: Medications offered as prescribed. All patient's questions and concerns addressed. Support, encouragement, and safe environment provided. 15-minute safety checks continue. R: Pt was med compliant.  Pt attended [place] group. Safety checks continue.

## 2016-06-28 NOTE — Progress Notes (Signed)
[facility] Brady Progress Note  06/28/2016 11:20 AM Haley Brady  MRN:  [id number]   Subjective:  Patient reports " I am still having a hard time sleeping throughout the night." reports I was taken 300 mg of Seroquel at home." -Reports she will attempt to call the [facility] again today, states she called yesterday after 4 pm.   Objective: Haley Brady is awake, Brady and oriented *3. Seen resting in bedroom. Reports experiencing  sadness and depression. Reports her depression 8/10 today. States she hasn't been attending group sessions because" I just need to rest, and I have racing thoughts when I am awake.".  Denies suicidal or homicidal ideation. Denies auditory or visual hallucination and does not appear to be responding to internal stimuli.  Patient continues to endorse racing thoughts. Medications adjustment was discussed. Patient reports she is medication compliant without mediation side effects.Reports good appetite other wise and resting well. Support, encouragement and reassurance was provided.     Principal Problem: MDD (major depressive disorder), recurrent severe, without psychosis ([place]) Diagnosis:   Patient Active Problem List   Diagnosis Date Noted  . OCD (obsessive compulsive disorder) [F42.9] 06/24/2016  . Cocaine use disorder, severe, dependence ([place]) [F14.20] 06/24/2016  . Opioid use disorder, severe, dependence ([place]) [F11.20] 06/24/2016  . Cannabis use disorder, severe, dependence ([place]) [F12.20] 06/24/2016  . 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 Depression [F32.9]   . Polysubstance dependence including opioid drug with daily use ([place]) [F19.20] 04/12/2016  . MDD (major depressive disorder), recurrent severe, without psychosis ([place]) [F33.2] 04/11/2016  . Dyspepsia [R10.13] 08/26/2014  . Chronic nausea [R11.0] 05/28/2011  . URINALYSIS, ABNORMAL [R82.99] 08/06/2006  . MRSA INFECTION [B95.7] 05/23/2006  . OBESITY NOS [E66.9] 05/23/2006  . Anxiety [F41.9] 05/23/2006  .  MIGRAINE HEADACHE [G43.909] 05/23/2006  . GERD [K21.9] 05/23/2006  . LOW BACK PAIN, CHRONIC [M54.5] 05/23/2006   Total Time spent with patient: 25 minutes  Past Psychiatric History: Please see H&P.   Past Medical History:  Past Medical History:  Diagnosis Date  . Abnormal pap   . Asymptomatic varicose veins   . Chronic back pain   . Chronic nausea   . DDD (degenerative disc disease)   . Depression   . Esophageal reflux   . Ingrowing nail   . Migraines   . Panic disorder without agoraphobia   . Renal disorder   . Vaginal Pap smear, abnormal     Past Surgical History:  Procedure Laterality Date  . BRAVO [place] STUDY  05/2011   on nexium 40mg  daily 217 episodes of reflux, Demeester score day 1: 51.9 and Day 2: 24.8.   Marland Kitchen CHOLECYSTECTOMY    . ESOPHAGOGASTRODUODENOSCOPY  06/11/2011    SMALL HIATAL HERNIA/Moderate gastritis/  BURNING CHESTPAIN: GERD, NERD, OR NON-ULCER DYSPEPSIA/ Nl esophagus  . TONSILLECTOMY     Family History:  Family History  Problem Relation Age of Onset  . Heart disease Mother   . Pancreatic cancer Father 87   Family Psychiatric  History: Please see H&P.  Social History: Please see H&P.  History  Alcohol Use  . Yes    Comment: occasionally     History  Drug Use  . Types: Cocaine, Marijuana, Methamphetamines    Social History   Social History  . Marital status: Single    Spouse name: N/A  . Number of children: 1  . Years of education: N/A   Occupational History  . [employer]   Social History Main Topics  .  Smoking status: Heavy Tobacco Smoker    Packs/day: 0.50    Types: Cigarettes  . Smokeless tobacco: Never Used     Comment: pt reports '4 cigarettes per day."  . Alcohol use Yes     Comment: occasionally  . Drug use: Yes    Types: Cocaine, Marijuana, Methamphetamines  . Sexual activity: Yes    Birth control/ protection: None   Other Topics Concern  . None   Social History Narrative   Lives w/ daughter-5    Additional Social History:                         Sleep: Poor  Appetite:  Fair  Current Medications: Current Facility-Administered Medications  Medication Dose Route Frequency Provider Last Rate Last Dose  . acetaminophen (TYLENOL) tablet 650 mg  650 mg Oral Q6H PRN Haley Brady      . alum & mag hydroxide-simeth (MAALOX/MYLANTA) 200-200-20 MG/5ML suspension 30 mL  30 mL Oral Q4H PRN Haley Brady      . Haley Memo ON 06/29/2016] buPROPion (WELLBUTRIN XL) 24 hr tablet 150 mg  150 mg Oral Daily Haley Brady      . Haley Memo ON 06/29/2016] cloNIDine (CATAPRES) tablet 0.1 mg  0.1 mg Oral QAC breakfast Haley Brady      . dicyclomine (BENTYL) tablet 20 mg  20 mg Oral Q6H PRN Haley Brady      . gabapentin (NEURONTIN) capsule 400 mg  400 mg Oral QHS Haley Alert, Brady   400 mg at 06/27/16 2149  . gabapentin (NEURONTIN) capsule 800 mg  800 mg Oral BH-q12n4p Haley Brady   800 mg at 06/27/16 1652  . hydrOXYzine (ATARAX/VISTARIL) tablet 25 mg  25 mg Oral Q6H PRN Haley Brady   25 mg at 06/25/16 2026  . loperamide (IMODIUM) capsule 2-4 mg  2-4 mg Oral PRN Haley Brady      . magnesium hydroxide (MILK OF MAGNESIA) suspension 30 mL  30 mL Oral Daily PRN Haley Brady      . methocarbamol (ROBAXIN) tablet 500 mg  500 mg Oral Q8H PRN Haley Brady   500 mg at 06/28/16 5621  . naproxen (NAPROSYN) tablet 500 mg  500 mg Oral BID PRN Haley Brady   500 mg at 06/28/16 0735  . nicotine (NICODERM CQ - dosed in mg/24 hours) patch 21 mg  21 mg Transdermal Daily Haley Brady   21 mg at 06/28/16 0736  . ondansetron (ZOFRAN-ODT) disintegrating tablet 4 mg  4 mg Oral Q6H PRN Haley Brady      . pantoprazole (PROTONIX) EC tablet 40 mg  40 mg Oral BID Haley Brady   40 mg at 06/28/16 0735  . [START ON 06/29/2016] QUEtiapine (SEROQUEL) tablet 100 mg   100 mg Oral Daily Haley Brady      . QUEtiapine (SEROQUEL) tablet 200 mg  200 mg Oral QHS Haley Brady      . traZODone (DESYREL) tablet 100 mg  100 mg Oral QHS PRN Haley Brady      . venlafaxine XR (EFFEXOR-XR) 24 hr capsule 150 mg  150 mg Oral Daily Haley Brady   150 mg at 06/28/16 0736    Lab Results:  No results found for this or any previous visit (from the past 48 hour(s)).  Blood Alcohol level:  Lab Results  Component Value Date   ETH <5 06/22/2016   ETH <5 42/35/3614    Metabolic Disorder Labs: Lab Results  Component Value Date   HGBA1C 5.3 06/25/2016   MPG 105 06/25/2016   Lab Results  Component Value Date   PROLACTIN 22.5 06/25/2016   Lab Results  Component Value Date   CHOL 193 06/25/2016   TRIG 232 (H) 06/25/2016   HDL 46 06/25/2016   CHOLHDL 4.2 06/25/2016   VLDL 46 (H) 06/25/2016   LDLCALC 101 (H) 06/25/2016    Physical Findings: AIMS: Facial and Oral Movements Muscles of Facial Expression: None, normal Lips and Perioral Area: None, normal Jaw: None, normal Tongue: None, normal,Extremity Movements Upper (arms, wrists, hands, fingers): None, normal Lower (legs, knees, ankles, toes): None, normal, Trunk Movements Neck, shoulders, hips: None, normal, Overall Severity Severity of abnormal movements (highest score from questions above): None, normal Incapacitation due to abnormal movements: None, normal Patient's awareness of abnormal movements (rate only patient's report): No Awareness, Dental Status Current problems with teeth and/or dentures?: No Does patient usually wear dentures?: No  CIWA:    COWS:  COWS Total Score: 1  Musculoskeletal: Strength & Muscle Tone: within normal limits Gait & Station: seen in bed Patient leans: N/A  Psychiatric Specialty Exam: Physical Exam  Nursing note and vitals reviewed.   Review of Systems  Psychiatric/Behavioral: Positive for depression and substance abuse. The patient is  nervous/anxious and has insomnia.   All other systems reviewed and are negative.   Blood pressure 126/72, pulse 62, temperature 98.4 F (36.9 C), temperature source Oral, resp. rate 16, height 5\' 4"  (1.626 m), weight 117.9 kg (260 lb), last menstrual period 06/20/2016.Body mass index is 44.63 kg/m.  General Appearance: Guarded  Eye Contact:  Minimal  Speech:  Clear and Coherent  Volume:  Decreased  Mood:  Depressed and Dysphoric  Affect:  Depressed  Thought Process:  Goal Directed and Descriptions of Associations: Intact  Orientation:  Full (Time, Place, and Person)  Thought Content:  Rumination  Suicidal Thoughts:  No  Homicidal Thoughts:  No  Memory:  Immediate;   Fair Recent;   Fair Remote;   Fair  Judgement:  Impaired  Insight:  Present  Psychomotor Activity:  Decreased  Concentration:  Concentration: Poor and Attention Span: Poor  Recall:  [employer] of Knowledge:  Fair  Language:  Fair  Akathisia:  No  Handed:  Right  AIMS (if indicated):     Assets:  Communication Skills Desire for Improvement Resilience Social Support  ADL's:  Intact  Cognition:  WNL  Sleep:  Number of Hours: 6.25    I agree with current treatment plan on 06/28/2016, Patient seen face-to-face for psychiatric evaluation follow-up, chart reviewed.  Reviewed the information documented and agree with the treatment plan.  MDD (major depressive disorder), recurrent severe, without psychosis ([place]) unstable  Treatment Plan Summary:. Daily contact with patient to assess and evaluate symptoms and progress in treatment, Medication management and Plan see below    Will continue today 06/28/16  plan as below except where it is noted.  Continue Effexor to 150 mg po daily for affective sx. Will adjust Seroquel to 100 mg  QAM and 200 mg QHS  for mood sx/insomnia. Will continue Neurontin 800 mg po bid and 400 mg po qhs to reduce her drowsiness during the day. (consider reducing further) Will  decrease  Wellbutrin XL 300 mg to 150 mg  daily morning for  depression Continue COWS/Clonidine protocol for opioid withdrawal sx. Labs reviewed - tsh - wnl, urine pregnancy test - negative, lipid panel - abnormal - diet management,iron panel - wnl , vitamin b12 - wnl . EKG reviewed qtc wnl. Pt motivated to go to an [employer] , [place] will continue to work on disposition.    Haley Brady 06/28/2016, 11:20 AM

## 2016-06-28 NOTE — Progress Notes (Signed)
Recreation Therapy Notes  Date: 06/28/16 Time: 1000 Location: 500 Hall Dayroom  Group Topic: Communication, Team Building, Problem Solving  Goal Area(s) Addresses:  Patient will effectively work with peer towards shared goal.  Patient will identify skills used to make activity successful.  Patient will identify how skills used during activity can be used to reach post d/c goals.   Intervention: STEM Activity  Activity: Geophysicist/field seismologist. In teams patients were given 12 plastic drinking straws and a length of masking tape. Using the materials provided patients were asked to build a landing pad to catch a golf ball dropped from approximately 6 feet in the air.   Education: Education officer, community, Discharge Planning   Education Outcome: Acknowledges education/In group clarification offered/Needs additional education.   Clinical Observations/Feedback: Pt did not attend group.   [name], LRT/CTRS         [name] A 06/28/2016 12:04 PM

## 2016-06-28 NOTE — Progress Notes (Signed)
Adult Psychoeducational Group Note  Date:  06/28/2016 Time:  9:26 PM  Group Topic/Focus:  Wrap-Up Group:   The focus of this group is to help patients review their daily goal of treatment and discuss progress on daily workbooks.  Participation Level:  Active  Participation Quality:  Appropriate and Attentive  Affect:  Appropriate  Cognitive:  Alert and Appropriate  Insight: Appropriate and Good  Engagement in Group:  Engaged  Modes of Intervention:  Discussion  Additional Comments:  Patient stated her goal for today was to attend all groups and get out the bed. Patient stated she felt good when she accomplished her goal today. Patient stated something positive that happen today was her daughter came and visit her. Patient stated her goal for tomorrow was to prepare for discharge. Candy Sledge 06/28/2016, 9:26 PM

## 2016-06-28 NOTE — BHH Group Notes (Signed)
[facility] LCSW Group Therapy  06/28/2016  1:05 PM  Type of Therapy:  Group therapy  Participation Level:  Active  Participation Quality:  Attentive  Affect:  Flat  Cognitive:  Oriented  Insight:  Limited  Engagement in Therapy:  Limited  Modes of Intervention:  Discussion, Socialization  Summary of Progress/Problems:  Chaplain was here to lead a group on themes of hope and courage. "Courage is walking in these doors, because it takes a lot to admit you have a problem.  I decided to come here on my own."  Later, "You need courage to keep going when you are depressed, stuck and hopeless.  That's where I have been recently."  Haley Brady 06/28/2016 1:39 PM

## 2016-06-28 NOTE — Progress Notes (Signed)
Nursing Note 06/28/2016 [id number]  Data Reports sleeping poor with PRN sleep med.  Rates depression 8/10, hopelessness 8/10, and anxiety 7/10. Affect blunted but appropriate.  States "I still feel depressed and anxious but much better than when I came in."  Denies HI, SI, AVH.  Patient slept in this AM despite encouragement to try to stay awake.  Reports sleeping a lot during the day. Awake and about milieu in afternoon.  Requesting med changes to taking Seroquel at bedtime instead of 5pm to help her sleep. C/O back pain.  Action Spoke with patient 1:1, nurse offered support to patient throughout shift.  Notified NP, NP adjusted Seroquel to be at bedtime and increased dosage.  PRN's for back pain.  Continues to be monitored on 15 minute checks for safety.  Response Remains safe and appropriate on unit.  PRN's effective for back pain.

## 2016-06-29 MED ORDER — QUETIAPINE FUMARATE 50 MG PO TABS
250.0000 mg | ORAL_TABLET | Freq: Every day | ORAL | Status: DC
  Administered 2016-06-29 – 2016-06-30 (×2): 250 mg via ORAL
  Filled 2016-06-29 (×3): qty 1

## 2016-06-29 NOTE — Progress Notes (Signed)
[facility] MD Progress Note  06/29/2016 3:05 PM Haley Brady  MRN:  [id number]   Subjective:  Patient states " I still feel like I have some racing thoughts , my seroquel needs to be increased."   Objective:Patient seen and chart reviewed.Discussed patient with treatment team.  Pt continues to stay in bed mostly , withdrawn, reports racing thoughts at night . Per RN , continues to need a lot of encouragement. She is making calls to [facility]. Motivated to get away from her drug abuse issues. However will need medication uptitration today.     Principal Problem: MDD (major depressive disorder), recurrent severe, without psychosis ([place]) Diagnosis:   Patient Active Problem List   Diagnosis Date Noted  . OCD (obsessive compulsive disorder) [F42.9] 06/24/2016  . Cocaine use disorder, severe, dependence ([place]) [F14.20] 06/24/2016  . Opioid use disorder, severe, dependence ([place]) [F11.20] 06/24/2016  . Cannabis use disorder, severe, dependence ([place]) [F12.20] 06/24/2016  . 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 Depression [F32.9]   . Polysubstance dependence including opioid drug with daily use ([place]) [F19.20] 04/12/2016  . MDD (major depressive disorder), recurrent severe, without psychosis ([place]) [F33.2] 04/11/2016  . Dyspepsia [R10.13] 08/26/2014  . Chronic nausea [R11.0] 05/28/2011  . URINALYSIS, ABNORMAL [R82.99] 08/06/2006  . MRSA INFECTION [B95.7] 05/23/2006  . OBESITY NOS [E66.9] 05/23/2006  . Anxiety [F41.9] 05/23/2006  . MIGRAINE HEADACHE [G43.909] 05/23/2006  . GERD [K21.9] 05/23/2006  . LOW BACK PAIN, CHRONIC [M54.5] 05/23/2006   Total Time spent with patient: 25 minutes  Past Psychiatric History: Please see H&P.   Past Medical History:  Past Medical History:  Diagnosis Date  . Abnormal pap   . Asymptomatic varicose veins   . Chronic back pain   . Chronic nausea   . DDD (degenerative disc disease)   . Depression   . Esophageal reflux   . Ingrowing nail   .  Migraines   . Panic disorder without agoraphobia   . Renal disorder   . Vaginal Pap smear, abnormal     Past Surgical History:  Procedure Laterality Date  . BRAVO [place] STUDY  05/2011   on nexium 40mg  daily 217 episodes of reflux, Demeester score day 1: 51.9 and Day 2: 24.8.   Marland Kitchen CHOLECYSTECTOMY    . ESOPHAGOGASTRODUODENOSCOPY  06/11/2011    SMALL HIATAL HERNIA/Moderate gastritis/  BURNING CHESTPAIN: GERD, NERD, OR NON-ULCER DYSPEPSIA/ Nl esophagus  . TONSILLECTOMY     Family History:  Family History  Problem Relation Age of Onset  . Heart disease Mother   . Pancreatic cancer Father 70   Family Psychiatric  History: Please see H&P.  Social History: Please see H&P.  History  Alcohol Use  . Yes    Comment: occasionally     History  Drug Use  . Types: Cocaine, Marijuana, Methamphetamines    Social History   Social History  . Marital status: Single    Spouse name: N/A  . Number of children: 1  . Years of education: N/A   Occupational History  . [employer]   Social History Main Topics  . Smoking status: Heavy Tobacco Smoker    Packs/day: 0.50    Types: Cigarettes  . Smokeless tobacco: Never Used     Comment: pt reports '4 cigarettes per day."  . Alcohol use Yes     Comment: occasionally  . Drug use: Yes    Types: Cocaine, Marijuana, Methamphetamines  . Sexual activity: Yes    Birth control/ protection:  None   Other Topics Concern  . None   Social History Narrative   Lives w/ daughter-5   Additional Social History:                         Sleep: restless  Appetite:  Fair  Current Medications: Current Facility-Administered Medications  Medication Dose Route Frequency Provider Last Rate Last Dose  . acetaminophen (TYLENOL) tablet 650 mg  650 mg Oral Q6H PRN Ethelene Hal, NP      . alum & mag hydroxide-simeth (MAALOX/MYLANTA) 200-200-20 MG/5ML suspension 30 mL  30 mL Oral Q4H PRN Ethelene Hal, NP      .  buPROPion (WELLBUTRIN XL) 24 hr tablet 150 mg  150 mg Oral Daily Derrill Center, NP   150 mg at 06/29/16 0756  . cloNIDine (CATAPRES) tablet 0.1 mg  0.1 mg Oral QAC breakfast Ethelene Hal, NP   0.1 mg at 06/29/16 8099  . gabapentin (NEURONTIN) capsule 400 mg  400 mg Oral QHS Ursula Alert, MD   400 mg at 06/28/16 2107  . gabapentin (NEURONTIN) capsule 800 mg  800 mg Oral BH-q12n4p Dondra Rhett, MD   800 mg at 06/29/16 1150  . hydrOXYzine (ATARAX/VISTARIL) tablet 25 mg  25 mg Oral Q6H PRN Ethelene Hal, NP   25 mg at 06/25/16 2026  . magnesium hydroxide (MILK OF MAGNESIA) suspension 30 mL  30 mL Oral Daily PRN Ethelene Hal, NP      . nicotine (NICODERM CQ - dosed in mg/24 hours) patch 21 mg  21 mg Transdermal Daily Ethelene Hal, NP   21 mg at 06/29/16 0755  . pantoprazole (PROTONIX) EC tablet 40 mg  40 mg Oral BID Ethelene Hal, NP   40 mg at 06/29/16 0756  . QUEtiapine (SEROQUEL) tablet 100 mg  100 mg Oral Daily Derrill Center, NP   100 mg at 06/29/16 0756  . QUEtiapine (SEROQUEL) tablet 250 mg  250 mg Oral QHS Naimah Yingst, MD      . traZODone (DESYREL) tablet 100 mg  100 mg Oral QHS PRN Derrill Center, NP   100 mg at 06/28/16 2107  . venlafaxine XR (EFFEXOR-XR) 24 hr capsule 150 mg  150 mg Oral Daily Geraline Halberstadt, MD   150 mg at 06/29/16 0755    Lab Results:  No results found for this or any previous visit (from the past 48 hour(s)).  Blood Alcohol level:  Lab Results  Component Value Date   ETH <5 06/22/2016   ETH <5 83/38/2505    Metabolic Disorder Labs: Lab Results  Component Value Date   HGBA1C 5.3 06/25/2016   MPG 105 06/25/2016   Lab Results  Component Value Date   PROLACTIN 22.5 06/25/2016   Lab Results  Component Value Date   CHOL 193 06/25/2016   TRIG 232 (H) 06/25/2016   HDL 46 06/25/2016   CHOLHDL 4.2 06/25/2016   VLDL 46 (H) 06/25/2016   LDLCALC 101 (H) 06/25/2016    Physical Findings: AIMS: Facial and  Oral Movements Muscles of Facial Expression: None, normal Lips and Perioral Area: None, normal Jaw: None, normal Tongue: None, normal,Extremity Movements Upper (arms, wrists, hands, fingers): None, normal Lower (legs, knees, ankles, toes): None, normal, Trunk Movements Neck, shoulders, hips: None, normal, Overall Severity Severity of abnormal movements (highest score from questions above): None, normal Incapacitation due to abnormal movements: None, normal Patient's awareness of abnormal movements (rate only patient's  report): No Awareness, Dental Status Current problems with teeth and/or dentures?: No Does patient usually wear dentures?: No  CIWA:    COWS:  COWS Total Score: 0  Musculoskeletal: Strength & Muscle Tone: within normal limits Gait & Station: seen in bed Patient leans: N/A  Psychiatric Specialty Exam: Physical Exam  Nursing note and vitals reviewed.   Review of Systems  Psychiatric/Behavioral: Positive for depression and substance abuse. The patient is nervous/anxious and has insomnia.   All other systems reviewed and are negative.   Blood pressure 107/80, pulse 94, temperature 97.7 F (36.5 C), temperature source Oral, resp. rate 20, height 5\' 4"  (1.626 m), weight 117.9 kg (260 lb), last menstrual period 06/20/2016.Body mass index is 44.63 kg/m.  General Appearance: Guarded  Eye Contact:  Minimal  Speech:  Slow  Volume:  Decreased  Mood:  Anxious and Depressed  Affect:  Constricted  Thought Process:  Goal Directed and Descriptions of Associations: Intact  Orientation:  Full (Time, Place, and Person)  Thought Content:  Rumination  Suicidal Thoughts:  No  Homicidal Thoughts:  No  Memory:  Immediate;   Fair Recent;   Fair Remote;   Fair  Judgement:  Impaired  Insight:  Shallow  Psychomotor Activity:  Decreased  Concentration:  Concentration: Poor and Attention Span: Poor  Recall:  [employer] of Knowledge:  Fair  Language:  Fair  Akathisia:  No  Handed:   Right  AIMS (if indicated):     Assets:  Communication Skills Desire for Improvement  ADL's:  Intact  Cognition:  WNL  Sleep:  Number of Hours: 6.25    MDD (major depressive disorder), recurrent severe, without psychosis ([place]) unstable  Will continue today 06/29/16 plan as below except where it is noted.     Treatment Plan Summary:Patient continues to have sleep issues , racing thoughts, partial improvement , will continue treatment.   Daily contact with patient to assess and evaluate symptoms and progress in treatment, Medication management and Plan see below   Continue Effexor to 150 mg po daily for affective sx. Will increase Seroquel to 100 mg po daily 250 mg po qhs for mood sx/racing thoughts/insomnia. Will change Neurontin to 800 mg po bid and 400 mg po qhs to reduce her drowsiness during the day. We will reintroduce Wellbutrin XL 300 mg daily morning for depression Continue COWS/Clonidine protocol for opioid withdrawal sx. Patient continues to be motivated to go to an [employer] , [place] will continue to assist.    Arham Symmonds, MD 06/29/2016, 3:05 PM

## 2016-06-29 NOTE — Progress Notes (Signed)
Adult Psychoeducational Group Note  Date:  06/29/2016 Time:  9:55 PM  Group Topic/Focus:  Wrap-Up Group:   The focus of this group is to help patients review their daily goal of treatment and discuss progress on daily workbooks.  Participation Level:  Active  Participation Quality:  Appropriate and Attentive  Affect:  Appropriate  Cognitive:  Alert and Appropriate  Insight: Appropriate and Good  Engagement in Group:  Engaged  Modes of Intervention:  Discussion  Additional Comments:  Patient goal was to establish a plan for discharge. Patient felt awesome when she achieved his goal. Patient rated her day a 5 out of 10. Patient stated something positive that happened today was her nephew had his engagement party today. Patient stated her goal for tomorrow was to work more on discharge plan.   Candy Sledge 06/29/2016, 9:55 PM

## 2016-06-29 NOTE — Progress Notes (Signed)
Patient denies SI, HI, and AVH.  Patient has been compliant with medications and attended groups.  Patient has had no issues of behavioral dyscontrol.    Assess patient for safety, offer medications as prescribed, engage patient in 1:1 staff talks.   Patient able to contract for safety. Continue to monitor as planned.

## 2016-06-29 NOTE — Progress Notes (Signed)
Nursing Progress Note: 7p-7a D: Pt currently presents with a depressed/anxious affect and behavior. Pt states "I don't want to leave. I have a terrible time sleeping. I get horrible sleep paralysis. I wake up super anxious" Interacting fair with milieu. Pt reports poor sleep with current medication regimen.   A: Pt provided with medications per providers orders. Pt's labs and vitals were monitored throughout the night. Pt supported emotionally and encouraged to express concerns and questions. Pt educated on medications.  R: Pt's safety ensured with 15 minute and environmental checks. Pt currently denies SI/HI/Self Harm and AVH. Pt verbally contracts to seek staff if SI/HI or A/VH occurs and to consult with staff before acting on any harmful thoughts. Will continue to monitor.

## 2016-06-29 NOTE — BHH Group Notes (Signed)
[place] Group Notes: (Clinical Social Work)   06/29/2016      Type of Therapy:  Group Therapy   Participation Level:  Did Not Attend despite MHT prompting   Selmer Dominion, LCSW 06/29/2016, 1:01 PM

## 2016-06-29 NOTE — Progress Notes (Signed)
D: Pt at the time of assessment was alert and oriented x4. Pt at the time endorsed moderate anxiety and depression; states, "My daughter was here today and that really made my day." Pt denied pain, HI or AVH. Observed interacting with peers. A: Medications offered as prescribed. All patient's questions and concerns addressed. Support, encouragement, and safe environment provided. 15-minute safety checks continue. R: Pt was med compliant.  Pt attended wrap-up group. Safety checks continue.

## 2016-06-30 NOTE — Progress Notes (Signed)
D: Pt denies SI/HI/AVH. Pt is pleasant and cooperative. Pt very active on the unit. Pt stated she had a good day, everything is starting to come together, pt plans to possibly D/C to an [place]  A: Pt was offered support and encouragement. Pt was given scheduled medications. Pt was encourage to attend groups. Q 15 minute checks were done for safety.   R:Pt attends groups and interacts well with peers and staff. Pt is taking medication. Pt has no complaints.Pt receptive to treatment and safety maintained on unit.

## 2016-06-30 NOTE — Progress Notes (Addendum)
[facility] MD Progress Note  06/30/2016 2:28 PM Haley Brady  MRN:  [id number]   Subjective: Pt states " I am ok."    Objective:Patient seen and chart reviewed.Discussed patient with treatment team.  Pt seen in bed in the AM , however states she is more active in milieu later on in the day. Pt denies any new concerns. Continues to be motivated to get help with her substance abuse issues. Continue treatment.     Principal Problem: MDD (major depressive disorder), recurrent severe, without psychosis ([place]) Diagnosis:   Patient Active Problem List   Diagnosis Date Noted  . OCD (obsessive compulsive disorder) [F42.9] 06/24/2016  . Cocaine use disorder, severe, dependence ([place]) [F14.20] 06/24/2016  . Opioid use disorder, severe, dependence ([place]) [F11.20] 06/24/2016  . Cannabis use disorder, severe, dependence ([place]) [F12.20] 06/24/2016  . 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 Depression [F32.9]   . Polysubstance dependence including opioid drug with daily use ([place]) [F19.20] 04/12/2016  . MDD (major depressive disorder), recurrent severe, without psychosis ([place]) [F33.2] 04/11/2016  . Dyspepsia [R10.13] 08/26/2014  . Chronic nausea [R11.0] 05/28/2011  . URINALYSIS, ABNORMAL [R82.99] 08/06/2006  . MRSA INFECTION [B95.7] 05/23/2006  . OBESITY NOS [E66.9] 05/23/2006  . Anxiety [F41.9] 05/23/2006  . MIGRAINE HEADACHE [G43.909] 05/23/2006  . GERD [K21.9] 05/23/2006  . LOW BACK PAIN, CHRONIC [M54.5] 05/23/2006   Total Time spent with patient: 20 minutes  Past Psychiatric History: Please see H&P.   Past Medical History:  Past Medical History:  Diagnosis Date  . Abnormal pap   . Asymptomatic varicose veins   . Chronic back pain   . Chronic nausea   . DDD (degenerative disc disease)   . Depression   . Esophageal reflux   . Ingrowing nail   . Migraines   . Panic disorder without agoraphobia   . Renal disorder   . Vaginal Pap smear, abnormal     Past Surgical History:  Procedure  Laterality Date  . BRAVO [place] STUDY  05/2011   on nexium 40mg  daily 217 episodes of reflux, [place]ter score day 1: 51.9 and Day 2: 24.8.   Marland Kitchen CHOLECYSTECTOMY    . ESOPHAGOGASTRODUODENOSCOPY  06/11/2011    SMALL HIATAL HERNIA/Moderate gastritis/  BURNING CHESTPAIN: GERD, NERD, OR NON-ULCER DYSPEPSIA/ Nl esophagus  . TONSILLECTOMY     Family History:  Family History  Problem Relation Age of Onset  . Heart disease Mother   . Pancreatic cancer Father 27   Family Psychiatric  History: Please see H&P.  Social History: Please see H&P.  History  Alcohol Use  . Yes    Comment: occasionally     History  Drug Use  . Types: Cocaine, Marijuana, Methamphetamines    Social History   Social History  . Marital status: Single    Spouse name: N/A  . Number of children: 1  . Years of education: N/A   Occupational History  . [employer]   Social History Main Topics  . Smoking status: Heavy Tobacco Smoker    Packs/day: 0.50    Types: Cigarettes  . Smokeless tobacco: Never Used     Comment: pt reports '4 cigarettes per day."  . Alcohol use Yes     Comment: occasionally  . Drug use: Yes    Types: Cocaine, Marijuana, Methamphetamines  . Sexual activity: Yes    Birth control/ protection: None   Other Topics Concern  . None   Social History Narrative   Lives w/ daughter-5  Additional Social History:                         Sleep: Fair  Appetite:  Fair  Current Medications: Current Facility-Administered Medications  Medication Dose Route Frequency Provider Last Rate Last Dose  . acetaminophen (TYLENOL) tablet 650 mg  650 mg Oral Q6H PRN Ethelene Hal, NP   650 mg at 06/29/16 2109  . alum & mag hydroxide-simeth (MAALOX/MYLANTA) 200-200-20 MG/5ML suspension 30 mL  30 mL Oral Q4H PRN Ethelene Hal, NP      . buPROPion (WELLBUTRIN XL) 24 hr tablet 150 mg  150 mg Oral Daily Derrill Center, NP   150 mg at 06/30/16 0725  . gabapentin  (NEURONTIN) capsule 400 mg  400 mg Oral QHS Carlus Stay, Ria Clock, MD   400 mg at 06/29/16 2109  . gabapentin (NEURONTIN) capsule 800 mg  800 mg Oral [id number] Noemi Bellissimo, MD   800 mg at 06/30/16 1207  . hydrOXYzine (ATARAX/VISTARIL) tablet 25 mg  25 mg Oral Q6H PRN Ethelene Hal, NP   25 mg at 06/29/16 2109  . magnesium hydroxide (MILK OF MAGNESIA) suspension 30 mL  30 mL Oral Daily PRN Ethelene Hal, NP      . nicotine (NICODERM CQ - dosed in mg/24 hours) patch 21 mg  21 mg Transdermal Daily Ethelene Hal, NP   21 mg at 06/29/16 0755  . pantoprazole (PROTONIX) EC tablet 40 mg  40 mg Oral BID Ethelene Hal, NP   40 mg at 06/30/16 0725  . QUEtiapine (SEROQUEL) tablet 100 mg  100 mg Oral Daily Derrill Center, NP   100 mg at 06/30/16 0725  . QUEtiapine (SEROQUEL) tablet 250 mg  250 mg Oral QHS Ursula Alert, MD   250 mg at 06/29/16 2108  . traZODone (DESYREL) tablet 100 mg  100 mg Oral QHS PRN Derrill Center, NP   100 mg at 06/29/16 2109  . venlafaxine XR (EFFEXOR-XR) 24 hr capsule 150 mg  150 mg Oral Daily Quantez Schnyder, MD   150 mg at 06/30/16 0725    Lab Results:  No results found for this or any previous visit (from the past 48 hour(s)).  Blood Alcohol level:  Lab Results  Component Value Date   ETH <5 06/22/2016   ETH <5 81/27/5170    Metabolic Disorder Labs: Lab Results  Component Value Date   HGBA1C 5.3 06/25/2016   MPG 105 06/25/2016   Lab Results  Component Value Date   PROLACTIN 22.5 06/25/2016   Lab Results  Component Value Date   CHOL 193 06/25/2016   TRIG 232 (H) 06/25/2016   HDL 46 06/25/2016   CHOLHDL 4.2 06/25/2016   VLDL 46 (H) 06/25/2016   LDLCALC 101 (H) 06/25/2016    Physical Findings: AIMS: Facial and Oral Movements Muscles of Facial Expression: None, normal Lips and Perioral Area: None, normal Jaw: None, normal Tongue: None, normal,Extremity Movements Upper (arms, wrists, hands, fingers): None, normal Lower  (legs, knees, ankles, toes): None, normal, Trunk Movements Neck, shoulders, hips: None, normal, Overall Severity Severity of abnormal movements (highest score from questions above): None, normal Incapacitation due to abnormal movements: None, normal Patient's awareness of abnormal movements (rate only patient's report): No Awareness, Dental Status Current problems with teeth and/or dentures?: No Does patient usually wear dentures?: No  CIWA:    COWS:  COWS Total Score: 0  Musculoskeletal: Strength & Muscle Tone: within normal limits Gait &  Station: seen in bed Patient leans: N/A  Psychiatric Specialty Exam: Physical Exam  Nursing note and vitals reviewed.   Review of Systems  Psychiatric/Behavioral: Positive for depression and substance abuse. The patient is nervous/anxious.   All other systems reviewed and are negative.   Blood pressure 115/71, pulse 78, temperature 97.8 F (36.6 C), temperature source Oral, resp. rate 20, height 5\' 4"  (1.626 m), weight 117.9 kg (260 lb), last menstrual period 06/20/2016.Body mass index is 44.63 kg/m.  General Appearance: Guarded  Eye Contact:  Minimal  Speech:  Normal Rate  Volume:  Normal  Mood:  Depressed  Affect:  Congruent  Thought Process:  Goal Directed and Descriptions of Associations: Intact  Orientation:  Full (Time, Place, and Person)  Thought Content:  Rumination  Suicidal Thoughts:  No  Homicidal Thoughts:  No  Memory:  Immediate;   Fair Recent;   Fair Remote;   Fair  Judgement:  Fair  Insight:  Shallow  Psychomotor Activity:  Decreased  Concentration:  Concentration: Fair and Attention Span: Fair  Recall:  [employer] of Knowledge:  Fair  Language:  Fair  Akathisia:  No  Handed:  Right  AIMS (if indicated):     Assets:  Communication Skills Desire for Improvement  ADL's:  Intact  Cognition:  WNL  Sleep:  Number of Hours: 6.75    MDD (major depressive disorder), recurrent severe, without psychosis  ([place]) improving  Will continue today 06/30/16 plan as below except where it is noted.       Treatment Plan Summary:Patient seen as making progress , continues to be motivated to get help with substance abuse.   Daily contact with patient to assess and evaluate symptoms and progress in treatment, Medication management and Plan see below   Continue Effexor to 150 mg po daily for affective sx. Continue Seroquel to 100 mg po daily 250 mg po qhs for mood sx/racing thoughts/insomnia. Will change Neurontin to 800 mg po bid and 400 mg po qhs to reduce her drowsiness during the day. Continue Wellbutrin XL 300 mg daily morning for depression Continue COWS/Clonidine protocol for opioid withdrawal sx. Patient continues to be motivated to go to an [employer] , [place] will continue to assist.    Jackelyne Sayer, MD 06/30/2016, 2:28 PM

## 2016-06-30 NOTE — B[facility] Group Notes (Signed)
[place] Group Notes:  (Clinical Social Work)  06/30/2016  11:00AM-12:00PM  Summary of Progress/Problems:  The main focus of today's process group was to listen to a variety of genres of music and to identify that different types of music provoke different responses.  The patient then was able to identify personally what was soothing for them, as well as energizing, as well as how patient can personally use this knowledge in sleep habits, with depression, and with other symptoms.  The patient expressed at the beginning of group the overall feeling of sleepy, because she had just awakened.  She became more awake after a few songs, smiled and laughed some, did not react to some music.  She stated the last song, Native American pan flute, made her sad, but overall her mood was good at the end of group.  Type of Therapy:  Music Therapy   Participation Level:  Active  Participation Quality:  Attentive  Affect:  Blunted  Cognitive:  Oriented  Insight:  Improving  Engagement in Therapy:  Engaged  Modes of Intervention:   Activity, Exploration  Selmer Dominion, LCSW 06/30/2016

## 2016-06-30 NOTE — Progress Notes (Signed)
Patient ID: Haley Brady, female   DOB: February 14, 1985, 31 y.o.   MRN: [id number]    D: Pt has been appropriate on the unit today, she has attended all groups and engaged in treatment. Pt reported that she feels like she can be discharged. Pt did talk to doctor, she was told that she could be discharged tomorrow. Pt however reported on her self inventory sheet that her depression was a 8, her hopelessness was a 8, and her anxiety was 8. Pt reported that her goal for today was to work on discharge plan. Pt reported being negative SI/HI, no AH/VH noted. A: 15 min checks continued for patient safety. R: Pt safety maintained.

## 2016-06-30 NOTE — Plan of Care (Signed)
Problem: Coping: Goal: Ability to demonstrate self-control will improve Outcome: Progressing Pt has remained calm on the unit all evening, no incidents

## 2016-07-01 MED ORDER — GABAPENTIN 400 MG PO CAPS: 800.0000 mg | ORAL_CAPSULE | ORAL | 0 refills | Status: DC

## 2016-07-01 MED ORDER — TRAZODONE HCL 100 MG PO TABS: 100.0000 mg | ORAL_TABLET | Freq: Every evening | ORAL | 0 refills | Status: DC | PRN

## 2016-07-01 MED ORDER — GABAPENTIN 400 MG PO CAPS: 400.0000 mg | ORAL_CAPSULE | Freq: Every day | ORAL | 0 refills | Status: DC

## 2016-07-01 MED ORDER — PANTOPRAZOLE SODIUM 40 MG PO TBEC: 40.0000 mg | DELAYED_RELEASE_TABLET | Freq: Two times a day (BID) | ORAL | 0 refills | Status: DC

## 2016-07-01 MED ORDER — VENLAFAXINE HCL ER 150 MG PO CP24: 150.0000 mg | ORAL_CAPSULE | Freq: Every day | ORAL | 0 refills | Status: DC

## 2016-07-01 MED ORDER — BUPROPION HCL ER (XL) 150 MG PO TB24: 150.0000 mg | ORAL_TABLET | Freq: Every day | ORAL | 0 refills | Status: DC

## 2016-07-01 MED ORDER — QUETIAPINE FUMARATE 50 MG PO TABS: 250.0000 mg | ORAL_TABLET | Freq: Every day | ORAL | 0 refills | Status: DC

## 2016-07-01 MED ORDER — NICOTINE 21 MG/24HR TD PT24: 21.0000 mg | MEDICATED_PATCH | Freq: Every day | TRANSDERMAL | 0 refills | Status: DC

## 2016-07-01 MED ORDER — QUETIAPINE FUMARATE 100 MG PO TABS: 100.0000 mg | ORAL_TABLET | Freq: Every day | ORAL | 0 refills | Status: DC

## 2016-07-01 MED ORDER — HYDROXYZINE HCL 25 MG PO TABS: 25.0000 mg | ORAL_TABLET | Freq: Four times a day (QID) | ORAL | 0 refills | Status: DC | PRN

## 2016-07-01 NOTE — BHH Suicide Risk Assessment (Signed)
[facility] Discharge Suicide Risk Assessment   Principal Problem: MDD (major depressive disorder), recurrent severe, without psychosis ([place]) Discharge Diagnoses:  Patient Active Problem List   Diagnosis Date Noted  . OCD (obsessive compulsive disorder) [F42.9] 06/24/2016  . Cocaine use disorder, severe, dependence ([place]) [F14.20] 06/24/2016  . Opioid use disorder, severe, dependence ([place]) [F11.20] 06/24/2016  . Cannabis use disorder, severe, dependence ([place]) [F12.20] 06/24/2016  . 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 Depression [F32.9]   . Polysubstance dependence including opioid drug with daily use ([place]) [F19.20] 04/12/2016  . MDD (major depressive disorder), recurrent severe, without psychosis ([place]) [F33.2] 04/11/2016  . Dyspepsia [R10.13] 08/26/2014  . Chronic nausea [R11.0] 05/28/2011  . URINALYSIS, ABNORMAL [R82.99] 08/06/2006  . MRSA INFECTION [B95.7] 05/23/2006  . OBESITY NOS [E66.9] 05/23/2006  . Anxiety [F41.9] 05/23/2006  . MIGRAINE HEADACHE [G43.909] 05/23/2006  . GERD [K21.9] 05/23/2006  . LOW BACK PAIN, CHRONIC [M54.5] 05/23/2006    Total Time spent with patient: 30 minutes  Musculoskeletal: Strength & Muscle Tone: within normal limits Gait & Station: normal Patient leans: N/A  Psychiatric Specialty Exam: Review of Systems  Psychiatric/Behavioral: Positive for substance abuse. Negative for depression, hallucinations and suicidal ideas.  All other systems reviewed and are negative.   Blood pressure 119/84, pulse 78, temperature 98.5 F (36.9 C), temperature source Oral, resp. rate 18, height 5\' 4"  (1.626 m), weight 117.9 kg (260 lb), last menstrual period 06/20/2016.Body mass index is 44.63 kg/m.  General Appearance: Casual  Eye Contact::  Fair  Speech:  Normal Rate409  Volume:  Normal  Mood:  Euthymic  Affect:  Appropriate  Thought Process:  Goal Directed and Descriptions of Associations: Intact  Orientation:  Full (Time, Place, and Person)  Thought Content:   Logical  Suicidal Thoughts:  No  Homicidal Thoughts:  No  Memory:  Immediate;   Fair Recent;   Fair Remote;   Fair  Judgement:  Fair  Insight:  Fair  Psychomotor Activity:  Normal  Concentration:  Fair  Recall:  [employer] of Knowledge:Fair  Language: Fair  Akathisia:  No  Handed:  Right  AIMS (if indicated):     Assets:  Communication Skills Desire for Improvement  Sleep:  Number of Hours: 5.5  Cognition: WNL  ADL's:  Intact   Mental Status Per Nursing Assessment::   On Admission:     Demographic Factors:  Caucasian  Loss Factors: NA  Historical Factors: Impulsivity  Risk Reduction Factors:   Positive therapeutic relationship  Continued Clinical Symptoms:  Alcohol/Substance Abuse/Dependencies Previous Psychiatric Diagnoses and Treatments  Cognitive Features That Contribute To Risk:  None    Suicide Risk:  Minimal: No identifiable suicidal ideation.  Patients presenting with no risk factors but with morbid ruminations; may be classified as minimal risk based on the severity of the depressive symptoms    Plan Of Care/Follow-up recommendations:  Activity:  no restrictions Diet:  regular Tests:  as needed Other:  follow up with aftercare  Van Seymore, MD 07/01/2016, 9:49 AM

## 2016-07-01 NOTE — Discharge Summary (Signed)
Physician Discharge Summary Note  Patient:  Haley Brady is an 31 y.o., female MRN:  [id number] DOB:  Oct 21, 1985 Patient phone:  [phone] (home)  Patient address:   [address] [place] [address],  Total Time spent with patient: Greater than 30 munites  Date of Admission:  06/23/2016  Date of Discharge: 07-01-16  Reason for Admission: Worsening symptoms of depression & drug intoxication.  Principal Problem: MDD (major depressive disorder), recurrent severe, without psychosis [facility])  Discharge Diagnoses: Patient Active Problem List   Diagnosis Date Noted  . OCD (obsessive compulsive disorder) [F42.9] 06/24/2016  . Cocaine use disorder, severe, dependence ([place]) [F14.20] 06/24/2016  . Opioid use disorder, severe, dependence ([place]) [F11.20] 06/24/2016  . Cannabis use disorder, severe, dependence ([place]) [F12.20] 06/24/2016  . 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 Depression [F32.9]   . Polysubstance dependence including opioid drug with daily use ([place]) [F19.20] 04/12/2016  . MDD (major depressive disorder), recurrent severe, without psychosis ([place]) [F33.2] 04/11/2016  . Dyspepsia [R10.13] 08/26/2014  . Chronic nausea [R11.0] 05/28/2011  . URINALYSIS, ABNORMAL [R82.99] 08/06/2006  . MRSA INFECTION [B95.7] 05/23/2006  . OBESITY NOS [E66.9] 05/23/2006  . Anxiety [F41.9] 05/23/2006  . MIGRAINE HEADACHE [G43.909] 05/23/2006  . GERD [K21.9] 05/23/2006  . LOW BACK PAIN, CHRONIC [M54.5] 05/23/2006   Past Psychiatric History: Hx. Polysubstance dependence.  Past Medical History:  Past Medical History:  Diagnosis Date  . Abnormal pap   . Asymptomatic varicose veins   . Chronic back pain   . Chronic nausea   . DDD (degenerative disc disease)   . Depression   . Esophageal reflux   . Ingrowing nail   . Migraines   . Panic disorder without agoraphobia   . Renal disorder   . Vaginal Pap smear, abnormal     Past Surgical History:  Procedure Laterality Date  . BRAVO [place]  STUDY  05/2011   on nexium 40mg  daily 217 episodes of reflux, Demeester score day 1: 51.9 and Day 2: 24.8.   Marland Kitchen CHOLECYSTECTOMY    . ESOPHAGOGASTRODUODENOSCOPY  06/11/2011    SMALL HIATAL HERNIA/Moderate gastritis/  BURNING CHESTPAIN: GERD, NERD, OR NON-ULCER DYSPEPSIA/ Nl esophagus  . TONSILLECTOMY     Family History:  Family History  Problem Relation Age of Onset  . Heart disease Mother   . Pancreatic cancer Father 46   Family Psychiatric  History: See H&P  Social History:  History  Alcohol Use  . Yes    Comment: occasionally     History  Drug Use  . Types: Cocaine, Marijuana, Methamphetamines    Social History   Social History  . Marital status: Single    Spouse name: N/A  . Number of children: 1  . Years of education: N/A   Occupational History  . [employer]   Social History Main Topics  . Smoking status: Heavy Tobacco Smoker    Packs/day: 0.50    Types: Cigarettes  . Smokeless tobacco: Never Used     Comment: pt reports '4 cigarettes per day."  . Alcohol use Yes     Comment: occasionally  . Drug use: Yes    Types: Cocaine, Marijuana, Methamphetamines  . Sexual activity: Yes    Birth control/ protection: None   Other Topics Concern  . None   Social History Narrative   Lives w/ daughter-5   Hospital Course: Aracelia is a 31 yr old C, who is single , unemployed, has a hx of depression, anxiety and polysubstance abuse,  presented from [place] with worsening mood sx as well as relapse on drugs.   Upon her arrival & admision to the adult unit, Anh was evaluated & her presenting symptoms identified. Her lab results upon admission showed her UDS positive for Amphetamine, opioid, Cocaine & THC.  She was intoxicated requiring detoxification treatments. She received Clonidine detoxification treatment protocols for opioid detox. She was also medicated & discharged on; Seroquel 100 mg & 250 mg respectively for mood control, Wellbutrin XL 150 mg for  depression, Gabapentin 400 mg & 800 mg respectively for agitation, Hydroxyzine 25 mg prn for anxiety, Effexor XR 150 mg for depression, Trazodone  100 mg for insomnia  & Nicotine patch 21 mg for smoking cessation. She was enrolled & encouraged to participate in the unit the group counseling sessions being offered & held on this unit. She learned coping skills. She presented other significant health issues that required treatment or monitoring. Helene Kelp was resumed on all her pertinent home medications for those health issues. She tolerated her treatment regimen without any adverse effects or reactions reported.  During the course of her hospitalization, Tatelyn was evaluated on daily basis by the clinical providers to assure her response to her treatment regimen.As her treatment progressed, improvement was noted as evidenced by her reports of decreasing symptoms, improved mood, affect, medication tolerance & active participation in the unit programming.She was encouraged to update her providers on her progress by daily completion of a self inventory assessment, noting mood, mental status, pain, any new symptoms, anxiety and or concerns.  Jinelle's symptoms responded well to her treatment regimen combined with a therapeutic and supportive environment. She was motivated for recovery as evidenced by a positive/appropriate behavior and her interaction with the staff & fellow patients.She also worked closely with the treatment team & case manager to develop a discharge plan with appropriate goals to maintain mood stability after discharge.   Upon her hospital discharge, Deseri was in much improved condition than upon admission.Her symptoms were reported as significantly decreased or resolved completely. She adamantly denies any SI/HI,  AVH, delusional thoughts & or paranoia. She was motivated to continue taking medication with a goal of continued improvement in mental health. She will continue psychiatric  care on an outpatient basis as noted below. She is provided with all the necessary information required to make this appointment without problems. Helene Kelp left [facility] with all personal belongings in no apparent distress. Transportation per family.  Physical Findings: AIMS: Facial and Oral Movements Muscles of Facial Expression: None, normal Lips and Perioral Area: None, normal Jaw: None, normal Tongue: None, normal,Extremity Movements Upper (arms, wrists, hands, fingers): None, normal Lower (legs, knees, ankles, toes): None, normal, Trunk Movements Neck, shoulders, hips: None, normal, Overall Severity Severity of abnormal movements (highest score from questions above): None, normal Incapacitation due to abnormal movements: None, normal Patient's awareness of abnormal movements (rate only patient's report): No Awareness, Dental Status Current problems with teeth and/or dentures?: No Does patient usually wear dentures?: No  CIWA:    COWS:  COWS Total Score: 0  Musculoskeletal: Strength & Muscle Tone: within normal limits Gait & Station: normal Patient leans: N/A  Psychiatric Specialty Exam: Physical Exam  Constitutional: She is oriented to person, place, and time. She appears well-developed.  HENT:  Head: Normocephalic.  Eyes: Pupils are equal, round, and reactive to light.  Neck: Normal range of motion.  Cardiovascular: Normal rate.   Respiratory: Effort normal.  GI: Soft.  Genitourinary:  Genitourinary Comments: Deferred  Musculoskeletal: Normal  range of motion.  Neurological: She is alert and oriented to person, place, and time.  Skin: Skin is warm and dry.    Review of Systems  Constitutional: Negative.   HENT: Negative.   Eyes: Negative.   Respiratory: Negative.   Cardiovascular: Negative.   Gastrointestinal: Negative.   Genitourinary: Negative.   Musculoskeletal: Negative.   Skin: Negative.   Neurological: Negative.   Endo/Heme/Allergies: Negative.    Psychiatric/Behavioral: Positive for depression (Stable) and substance abuse (Hx. Polysubstance dependence (Amphetamine, Opioid, cocaine & THC)). Negative for hallucinations, memory loss and suicidal ideas. The patient has insomnia (Stable). The patient is not nervous/anxious.     Blood pressure 119/84, pulse 78, temperature 98.5 F (36.9 C), temperature source Oral, resp. rate 18, height 5\' 4"  (1.626 m), weight 117.9 kg (260 lb), last menstrual period 06/20/2016.Body mass index is 44.63 kg/m.  See Md' ssRA   Have you used any form of tobacco in the last 30 days? (Cigarettes, Smokeless Tobacco, Cigars, and/or Pipes): Yes  Has this patient used any form of tobacco in the last 30 days? (Cigarettes, Smokeless Tobacco, Cigars, and/or Pipes)Z: Yes, provided with a nicotine patch prescription upon discharge.  Blood Alcohol level:  Lab Results  Component Value Date   [facility] <5 06/22/2016   ETH <5 56/38/7564   Metabolic Disorder Labs:  Lab Results  Component Value Date   HGBA1C 5.3 06/25/2016   MPG 105 06/25/2016   Lab Results  Component Value Date   PROLACTIN 22.5 06/25/2016   Lab Results  Component Value Date   CHOL 193 06/25/2016   TRIG 232 (H) 06/25/2016   HDL 46 06/25/2016   CHOLHDL 4.2 06/25/2016   VLDL 46 (H) 06/25/2016   LDLCALC 101 (H) 06/25/2016   See Psychiatric Specialty Exam and Suicide Risk Assessment completed by Attending Physician prior to discharge.  Discharge destination:  Home  Is patient on multiple antipsychotic therapies at discharge:  No   Has Patient had three or more failed trials of antipsychotic monotherapy by history:  No  Recommended Plan for Multiple Antipsychotic Therapies: NA  Allergies as of 07/01/2016   No Known Allergies     Medication List    STOP taking these medications   omeprazole 40 MG capsule Commonly known as:  PRILOSEC     TAKE these medications     Indication  buPROPion 150 MG 24 hr tablet Commonly known as:  WELLBUTRIN  XL Take 1 tablet (150 mg total) by mouth daily. For depression Start taking on:  07/02/2016 What changed:  medication strength  how much to take  additional instructions  Indication:  Major Depressive Disorder   gabapentin 400 MG capsule Commonly known as:  NEURONTIN Take 1 capsule (400 mg total) by mouth at bedtime. For agitation What changed:  You were already taking a medication with the same name, and this prescription was added. Make sure you understand how and when to take each.  Indication:  Agitation   gabapentin 400 MG capsule Commonly known as:  NEURONTIN Take 2 capsules (800 mg total) by mouth 2 times daily at 12 noon and 4 pm. For agitation/substance withdrawal symptoms. What changed:  when to take this  additional instructions  Indication:  Agitation, Substance withdrawal symptoms   hydrOXYzine 25 MG tablet Commonly known as:  ATARAX/VISTARIL Take 1 tablet (25 mg total) by mouth every 6 (six) hours as needed for anxiety. What changed:  how much to take  when to take this  Indication:  Anxiety Neurosis  nicotine 21 mg/24hr patch Commonly known as:  NICODERM CQ - dosed in mg/24 hours Place 1 patch (21 mg total) onto the skin daily. For smoking ceasation What changed:  additional instructions  Indication:  Nicotine Addiction   pantoprazole 40 MG tablet Commonly known as:  PROTONIX Take 1 tablet (40 mg total) by mouth 2 (two) times daily. For acid reflux What changed:  additional instructions  Indication:  Gastroesophageal Reflux Disease   QUEtiapine 50 MG tablet Commonly known as:  SEROQUEL Take 5 tablets (250 mg total) by mouth at bedtime. For mood control  Indication:  Mood control   QUEtiapine 100 MG tablet Commonly known as:  SEROQUEL Take 1 tablet (100 mg total) by mouth daily. For mood control Start taking on:  07/02/2016  Indication:  Mood control   traZODone 100 MG tablet Commonly known as:  DESYREL Take 1 tablet (100 mg total) by mouth at  bedtime as needed for sleep. What changed:  when to take this  reasons to take this  Indication:  Trouble Sleeping   venlafaxine XR 150 MG 24 hr capsule Commonly known as:  EFFEXOR-XR Take 1 capsule (150 mg total) by mouth daily. For depression Start taking on:  07/02/2016 What changed:  medication strength  how much to take  additional instructions  Indication:  Major Depressive Disorder      Follow-up recommendations: Activity:  As tolerated Diet: As recommended by your primary care doctor. Keep all scheduled follow-up appointments as recommended.   Comments: Patient is instructed prior to discharge to: Take all medications as prescribed by his/her mental healthcare provider. Report any adverse effects and or reactions from the medicines to his/her outpatient provider promptly. Patient has been instructed & cautioned: To not engage in alcohol and or illegal drug use while on prescription medicines. In the event of worsening symptoms, patient is instructed to call the crisis hotline, 911 and or go to the nearest ED for appropriate evaluation and treatment of symptoms. To follow-up with his/her primary care provider for your other medical issues, concerns and or health care needs.   Signed: Encarnacion Slates, NP, PMHNP, FNP-BC 07/01/2016, 10:01 AM

## 2016-07-01 NOTE — Progress Notes (Signed)
Recreation Therapy Notes  Date: 07/01/16 Time: 1000 Location: 500 Hall Dayroom  Group Topic:  Goal Setting  Goal Area(s) Addresses:  Patient will be able to identify at least 3 life goals.  Patient will be able to identify benefit of investing in goals.  Patient will be able to identify benefit of setting life goals.   Intervention: Worksheets, pencils  Activity: Life Goals.  Patients were given a worksheet with six categories (family, friends, work/school, spirituality, body and mental health).  Pt were to say what they were doing well, where they needed to improve and write a goal for how they would make the improvement.  Education:  Discharge Planning, Coping Skills, Life Goals  Education Outcome: Acknowledges Education/In Group Clarification Provided/Needs Additional Education  Clinical Observations:  Pt did not attend group.   [name], LRT/CTRS         [name] A 07/01/2016 12:39 PM

## 2016-07-01 NOTE — Progress Notes (Signed)
D:  Patient's self inventory sheet, patient sleeps good, sleep medication helpful.  Good appetite, low energy level, good concentration.  Rated depression 3, denied hopeless, rated anxiety 4.  Denied withdrawals.  Denied SI.  Denied physical problems.  Physical pain, worst pain #4 in past 24 hours, no pain medication.  Goal is get discharged and ready to move in.  Plans to talk to MD.  Does have discharge plans.  Wants discharge. A:  Medications administered per MD orders.  Emotional support and encouragement given patient. R:  Patient denied SI and HI, contracts for safety.  Denied A/V hallucinations.  Safety maintained with 15 minute checks.

## 2016-07-01 NOTE — Progress Notes (Signed)
Discharge Note:  Patient discharged home with family member.  Patient denied SI and HI.  Denied A/V hallucinations.  Suicide prevention information given and discussed with patient.  Patient stated she received all her belongings, clothing, toiletries, misc items, prescriptions, etc.  Patient stated she appreciated all assistance received from [facility] staff.  All required discharge information given to patient at discharge.

## 2016-07-01 NOTE — Tx Team (Signed)
Interdisciplinary Treatment and Diagnostic Plan Update  07/01/2016 Time of Session: 8:36 AM  Haley Brady MRN: [id number]  Principal Diagnosis: MDD (major depressive disorder), recurrent severe, without psychosis ([place])  Secondary Diagnoses: Principal Problem:   MDD (major depressive disorder), recurrent severe, without psychosis ([place]) Active Problems:   OCD (obsessive compulsive disorder)   Cocaine use disorder, severe, dependence ([place])   Opioid use disorder, severe, dependence ([place])   Cannabis use disorder, severe, dependence ([place])   Stimulant abuse   Current Medications:  Current Facility-Administered Medications  Medication Dose Route Frequency Provider Last Rate Last Dose  . acetaminophen (TYLENOL) tablet 650 mg  650 mg Oral Q6H PRN Ethelene Hal, NP   650 mg at 06/29/16 2109  . alum & mag hydroxide-simeth (MAALOX/MYLANTA) 200-200-20 MG/5ML suspension 30 mL  30 mL Oral Q4H PRN Ethelene Hal, NP      . buPROPion (WELLBUTRIN XL) 24 hr tablet 150 mg  150 mg Oral Daily Derrill Center, NP   150 mg at 07/01/16 0741  . gabapentin (NEURONTIN) capsule 400 mg  400 mg Oral QHS Ursula Alert, MD   400 mg at 06/30/16 2118  . gabapentin (NEURONTIN) capsule 800 mg  800 mg Oral [id number] Eappen, Saramma, MD   800 mg at 06/30/16 1619  . hydrOXYzine (ATARAX/VISTARIL) tablet 25 mg  25 mg Oral Q6H PRN Ethelene Hal, NP   25 mg at 06/30/16 2118  . magnesium hydroxide (MILK OF MAGNESIA) suspension 30 mL  30 mL Oral Daily PRN Ethelene Hal, NP      . nicotine (NICODERM CQ - dosed in mg/24 hours) patch 21 mg  21 mg Transdermal Daily Ethelene Hal, NP   21 mg at 07/01/16 0741  . pantoprazole (PROTONIX) EC tablet 40 mg  40 mg Oral BID Ethelene Hal, NP   40 mg at 07/01/16 0741  . QUEtiapine (SEROQUEL) tablet 100 mg  100 mg Oral Daily Derrill Center, NP   100 mg at 07/01/16 0741  . QUEtiapine (SEROQUEL) tablet 250 mg  250 mg Oral QHS Ursula Alert, MD    250 mg at 06/30/16 2118  . traZODone (DESYREL) tablet 100 mg  100 mg Oral QHS PRN Derrill Center, NP   100 mg at 06/30/16 2118  . venlafaxine XR (EFFEXOR-XR) 24 hr capsule 150 mg  150 mg Oral Daily Eappen, Saramma, MD   150 mg at 07/01/16 0741    PTA Medications: Prescriptions Prior to Admission  Medication Sig Dispense Refill Last Dose  . buPROPion (WELLBUTRIN XL) 300 MG 24 hr tablet Take 1 tablet (300 mg total) by mouth daily. 30 tablet 0 06/22/2016 at Unknown time  . gabapentin (NEURONTIN) 400 MG capsule Take 2 capsules (800 mg total) by mouth 3 (three) times daily. 180 capsule 0 06/22/2016 at Unknown time  . hydrOXYzine (ATARAX/VISTARIL) 25 MG tablet Take 1 tablet (25 mg total) by mouth every 6 (six) hours as needed for anxiety. (Patient taking differently: Take 75 mg by mouth 4 (four) times daily as needed for anxiety. ) 30 tablet 0 06/22/2016 at Unknown time  . nicotine (NICODERM CQ - DOSED IN MG/24 HOURS) 21 mg/24hr patch Place 1 patch (21 mg total) onto the skin daily. 28 patch 0 06/21/2016 at Unknown time  . omeprazole (PRILOSEC) 40 MG capsule Take 40 mg by mouth 2 (two) times daily.    06/22/2016 at Unknown time  . pantoprazole (PROTONIX) 40 MG tablet Take 1 tablet (40 mg total) by mouth  2 (two) times daily. (Patient not taking: Reported on 06/22/2016) 60 tablet 0 Not Taking at Unknown time  . traZODone (DESYREL) 100 MG tablet Take 1 tablet (100 mg total) by mouth at bedtime. 30 tablet 0 06/21/2016 at Unknown time  . venlafaxine XR (EFFEXOR-XR) 75 MG 24 hr capsule Take 1 capsule (75 mg total) by mouth daily. 30 capsule 0 06/22/2016 at Unknown time    Treatment Modalities: Medication Management, Group therapy, Case management,  1 to 1 session with clinician, Psychoeducation, Recreational therapy.   Physician Treatment Plan for Primary Diagnosis: MDD (major depressive disorder), recurrent severe, without psychosis ([name]) Long Term Goal(s): Improvement in symptoms so as ready for  discharge  Short Term Goals: Ability to verbalize feelings will improve Ability to disclose and discuss suicidal ideas Compliance with prescribed medications will improve Ability to identify triggers associated with substance abuse/mental health issues will improve Ability to verbalize feelings will improve Ability to disclose and discuss suicidal ideas Compliance with prescribed medications will improve Ability to identify triggers associated with substance abuse/mental health issues will improve  Medication Management: Evaluate patient's response, side effects, and tolerance of medication regimen.  Therapeutic Interventions: 1 to 1 sessions, Unit Group sessions and Medication administration.  Evaluation of Outcomes: Adequate for Discharge  Physician Treatment Plan for Secondary Diagnosis: Principal Problem:   MDD (major depressive disorder), recurrent severe, without psychosis ([place]) Active Problems:   OCD (obsessive compulsive disorder)   Cocaine use disorder, severe, dependence ([place])   Opioid use disorder, severe, dependence ([place])   Cannabis use disorder, severe, dependence ([place])   Stimulant abuse   Long Term Goal(s): Improvement in symptoms so as ready for discharge  Short Term Goals: Ability to verbalize feelings will improve Ability to disclose and discuss suicidal ideas Compliance with prescribed medications will improve Ability to identify triggers associated with substance abuse/mental health issues will improve Ability to verbalize feelings will improve Ability to disclose and discuss suicidal ideas Compliance with prescribed medications will improve Ability to identify triggers associated with substance abuse/mental health issues will improve  Medication Management: Evaluate patient's response, side effects, and tolerance of medication regimen.  Therapeutic Interventions: 1 to 1 sessions, Unit Group sessions and Medication administration.  Evaluation of Outcomes:  Adequate for Discharge   RN Treatment Plan for Primary Diagnosis: MDD (major depressive disorder), recurrent severe, without psychosis ([place]) Long Term Goal(s): Knowledge of disease and therapeutic regimen to maintain health will improve  Short Term Goals: Ability to disclose and discuss suicidal ideas and Compliance with prescribed medications will improve  Medication Management: RN will administer medications as ordered by provider, will assess and evaluate patient's response and provide education to patient for prescribed medication. RN will report any adverse and/or side effects to prescribing provider.  Therapeutic Interventions: 1 on 1 counseling sessions, Psychoeducation, Medication administration, Evaluate responses to treatment, Monitor vital signs and CBGs as ordered, Perform/monitor CIWA, COWS, AIMS and Fall Risk screenings as ordered, Perform wound care treatments as ordered.  Evaluation of Outcomes: Adequate for Discharge    Recreational Therapy Treatment Plan for Primary Diagnosis: MDD (major depressive disorder), recurrent severe, without psychosis ( [place]) Long Term Goal(s): Patient will participate in recreation therapy treatment in at least 2 group sessions without prompting from LRT  Short Term Goals: Patient will be able to identify at least 5 coping skills for admitting diagnosis by conclusion of recreation therapy treatment  Treatment Modalities: Group and Pet Therapy  Therapeutic Interventions: Psychoeducation  Evaluation of Outcomes: Adequate for Discharge   LCSW  Treatment Plan for Primary Diagnosis: MDD (major depressive disorder), recurrent severe, without psychosis ([place]) Long Term Goal(s): Safe transition to appropriate next level of care at discharge, Engage patient in therapeutic group addressing interpersonal concerns.  Short Term Goals: Engage patient in aftercare planning with referrals and resources  Therapeutic Interventions: Assess for all discharge  needs, 1 to 1 time with Social worker, Explore available resources and support systems, Assess for adequacy in community support network, Educate family and significant other(s) on suicide prevention, Complete Psychosocial Assessment, Interpersonal group therapy.  Evaluation of Outcomes: Met  Accepted at [facility], follow up [place] in Treatment: Attending groups: No Participating in groups: No Taking medication as prescribed: Yes Toleration medication: Yes, no side effects reported at this time Family/Significant other contact made: No Patient understands diagnosis: Yes AEB asking for help with racing thoughts Discussing patient identified problems/goals with staff: Yes Medical problems stabilized or resolved: Yes Denies suicidal/homicidal ideation: Yes Issues/concerns per patient self-inventory: None Other: N/A  New problem(s) identified: Spending her days in bed.  Not attending groups.  New Short Term/Long Term Goal(s): None identified at this time.   Discharge Plan or Barriers: Pt has asked for list of [facility]  Reason for Continuation of Hospitalization:   Estimated Length of Stay: D/C today  Attendees: Patient: 07/01/2016  8:36 AM  Physician: Ursula Alert, MD 07/01/2016  8:36 AM  Nursing: Otilio Carpen, RN 07/01/2016  8:36 AM  RN Care Manager: Lars Pinks, RN 07/01/2016  8:36 AM  Social Worker: Ripley Fraise 07/01/2016  8:36 AM  Recreational Therapist: [name], LRT/CTRS 07/01/2016  8:36 AM  Other: Norberto Sorenson 07/01/2016  8:36 AM  Other:  07/01/2016  8:36 AM    Scribe for Treatment Team:  [name] LCSW 07/01/2016 8:36 AM

## 2016-07-01 NOTE — Progress Notes (Addendum)
  [facility] Adult Case Management Discharge Plan :  Will you be returning to the same living situation after discharge:  No. Will enter [facility] [place] At discharge, do you have transportation home?: Yes,  family Do you have the ability to pay for your medications: Yes,  MCD  Release of information consent forms completed and in the chart;  Patient's signature needed at discharge.  Patient to Follow up at: Follow-up Information    [facility] Follow up on 07/08/2016.   Specialty:  Family Medicine Why:  Hospital discharge follow up appointment for assessment for medications management and therapy on 6/11 at 4 PM.  Please call to cancel/reschedule if needed.   Contact information: [address] [address] [phone]           Next level of care provider has access to [place] and Suicide Prevention discussed: Yes,  yes  Have you used any form of tobacco in the last 30 days? (Cigarettes, Smokeless Tobacco, Cigars, and/or Pipes): Yes  Has patient been referred to the Quitline?: Patient refused referral  Patient has been referred for addiction treatment: Yes  Beverely Pace 07/01/2016, 4:55 PM

## 2016-07-01 NOTE — Plan of Care (Signed)
Problem: [facility] Participation in Recreation Therapeutic Interventions Goal: STG-Patient will identify at least five coping skills for ** STG: Coping Skills - Patient will be able to identify at least 5 coping skills for anxiety by conclusion of recreation therapy tx  Outcome: Not Met (add Reason) Pt did not attend recreational therapy groups.  Haley Brady, LRT/CTRS

## 2016-09-13 ENCOUNTER — Encounter (HOSPITAL_COMMUNITY): Payer: Self-pay | Admitting: Emergency Medicine

## 2016-09-13 ENCOUNTER — Emergency Department (HOSPITAL_COMMUNITY)
Admission: EM | Admit: 2016-09-13 | Discharge: 2016-09-13 | Disposition: A | Discharge status: Home / Self Care | Payer: Medicaid Other | Attending: Emergency Medicine | Admitting: Emergency Medicine

## 2016-09-13 DIAGNOSIS — Z79899 Other long term (current) drug therapy: Secondary | ICD-10-CM | POA: Diagnosis not present

## 2016-09-13 DIAGNOSIS — W57XXXA Bitten or stung by nonvenomous insect and other nonvenomous arthropods, initial encounter: Secondary | ICD-10-CM | POA: Diagnosis not present

## 2016-09-13 DIAGNOSIS — F17210 Nicotine dependence, cigarettes, uncomplicated: Secondary | ICD-10-CM | POA: Insufficient documentation

## 2016-09-13 DIAGNOSIS — R21 Rash and other nonspecific skin eruption: Secondary | ICD-10-CM | POA: Diagnosis present

## 2016-09-13 DIAGNOSIS — R61 Generalized hyperhidrosis: Secondary | ICD-10-CM | POA: Insufficient documentation

## 2016-09-13 DIAGNOSIS — F419 Anxiety disorder, unspecified: Secondary | ICD-10-CM | POA: Diagnosis not present

## 2016-09-13 DIAGNOSIS — E860 Dehydration: Secondary | ICD-10-CM | POA: Diagnosis not present

## 2016-09-13 LAB — CBC WITH DIFFERENTIAL/PLATELET
BASOS ABS: 0 10*3/uL (ref 0.0–0.1)
Basophils Relative: 0 %
EOS ABS: 0.1 10*3/uL (ref 0.0–0.7)
EOS PCT: 1 %
HCT: 36.8 % (ref 36.0–46.0)
Hemoglobin: 12.2 g/dL (ref 12.0–15.0)
Lymphocytes Relative: 27 %
Lymphs Abs: 2.3 10*3/uL (ref 0.7–4.0)
MCH: 29.7 pg (ref 26.0–34.0)
MCHC: 33.2 g/dL (ref 30.0–36.0)
MCV: 89.5 fL (ref 78.0–100.0)
MONO ABS: 0.7 10*3/uL (ref 0.1–1.0)
Monocytes Relative: 8 %
Neutro Abs: 5.5 10*3/uL (ref 1.7–7.7)
Neutrophils Relative %: 64 %
PLATELETS: 324 10*3/uL (ref 150–400)
RBC: 4.11 MIL/uL (ref 3.87–5.11)
RDW: 12.9 % (ref 11.5–15.5)
WBC: 8.7 10*3/uL (ref 4.0–10.5)

## 2016-09-13 LAB — BASIC METABOLIC PANEL
ANION GAP: 8 (ref 5–15)
BUN: 11 mg/dL (ref 6–20)
CALCIUM: 9.2 mg/dL (ref 8.9–10.3)
CHLORIDE: 111 mmol/L (ref 101–111)
CO2: 29 mmol/L (ref 22–32)
CREATININE: 1.1 mg/dL — AB (ref 0.44–1.00)
GFR calc Af Amer: >60 mL/min (ref >60)
GLUCOSE: 141 mg/dL — AB (ref 65–99)
POTASSIUM: 4 mmol/L (ref 3.5–5.1)
Sodium: 148 mmol/L — ABNORMAL HIGH (ref 135–145)

## 2016-09-13 MED ORDER — HYDROXYZINE HCL 25 MG PO TABS: 25.0000 mg | ORAL_TABLET | Freq: Four times a day (QID) | ORAL | 0 refills | Status: DC

## 2016-09-13 MED ORDER — DIPHENHYDRAMINE HCL 25 MG PO CAPS
25.0000 mg | ORAL_CAPSULE | Freq: Once | ORAL | Status: AC
  Administered 2016-09-13: 25 mg via ORAL
  Filled 2016-09-13: qty 1

## 2016-09-13 MED ORDER — HYDROXYZINE HCL 25 MG PO TABS
25.0000 mg | ORAL_TABLET | Freq: Once | ORAL | Status: AC
  Administered 2016-09-13: 25 mg via ORAL
  Filled 2016-09-13: qty 1

## 2016-09-13 MED ORDER — PREDNISONE 10 MG PO TABS: ORAL_TABLET | ORAL | 0 refills | Status: DC

## 2016-09-13 MED ORDER — PREDNISONE 10 MG PO TABS
60.0000 mg | ORAL_TABLET | Freq: Once | ORAL | Status: AC
  Administered 2016-09-13: 60 mg via ORAL
  Filled 2016-09-13: qty 1

## 2016-09-13 MED ORDER — SODIUM CHLORIDE 0.9 % IV BOLUS (SEPSIS)
1000.0000 mL | Freq: Once | INTRAVENOUS | Status: AC
  Administered 2016-09-13: 1000 mL via INTRAVENOUS

## 2016-09-13 NOTE — Discharge Instructions (Signed)
Start the prednisone prescription tomorrow.  Try to avoid scratching.  Follow-up with your provider if needed.  Return here for any worsening symptoms

## 2016-09-13 NOTE — ED Provider Notes (Signed)
[facility] DEPT Provider Note   CSN: [id number] Arrival date & time: 09/13/16  2001     History   Chief Complaint Chief Complaint  Patient presents with  . Rash  . Hypotension    HPI Haley Brady is a 31 y.o. female.  HPI   Haley Brady is a 31 y.o. female who presents to the Emergency Department complaining of red rash and itching for 4 days.  She states the rash began on her hands and has continued to spread to the her legs, feet and both arms.  She was seen at the health dept two days ago and given an injection of steroids and prescription steroid cream which she states are not helping.  She admits that she has not been applying the cream as directed.  She denies known exposure to chemicals, new body lotions, detergents or soaps.  Possible flea bites, has been exposed to animals.  Other family members deny symptoms.  Patient denies pain, fever, chills, body aches, tick bites, myalgias, and shortness of breath. States that she became anxious and sweaty upon arrival and felt like she may "pass out."  Has felt similar in the past when her iron was low.    Past Medical History:  Diagnosis Date  . Abnormal pap   . Asymptomatic varicose veins   . Chronic back pain   . Chronic nausea   . DDD (degenerative disc disease)   . Depression   . Esophageal reflux   . Ingrowing nail   . Migraines   . Panic disorder without agoraphobia   . Renal disorder   . Vaginal Pap smear, abnormal     Patient Active Problem List   Diagnosis Date Noted  . OCD (obsessive compulsive disorder) 06/24/2016  . Cocaine use disorder, severe, dependence ([place]) 06/24/2016  . Opioid use disorder, severe, dependence ([place]) 06/24/2016  . Cannabis use disorder, severe, dependence ([place]) 06/24/2016  . Stimulant abuse 06/24/2016  . Depression   . Polysubstance dependence including opioid drug with daily use ([place]) 04/12/2016  . MDD (major depressive disorder), recurrent severe, without psychosis ([place])  04/11/2016  . Dyspepsia 08/26/2014  . Chronic nausea 05/28/2011  . URINALYSIS, ABNORMAL 08/06/2006  . MRSA INFECTION 05/23/2006  . OBESITY NOS 05/23/2006  . Anxiety 05/23/2006  . MIGRAINE HEADACHE 05/23/2006  . GERD 05/23/2006  . LOW BACK PAIN, CHRONIC 05/23/2006    Past Surgical History:  Procedure Laterality Date  . BRAVO [place] STUDY  05/2011   on nexium 40mg  daily 217 episodes of reflux, [place]ter score day 1: 51.9 and Day 2: 24.8.   Marland Kitchen CHOLECYSTECTOMY    . ESOPHAGOGASTRODUODENOSCOPY  06/11/2011    SMALL HIATAL HERNIA/Moderate gastritis/  BURNING CHESTPAIN: GERD, NERD, OR NON-ULCER DYSPEPSIA/ Nl esophagus  . TONSILLECTOMY      OB History    Gravida Para Term Preterm AB Living   2 1       1    SAB TAB Ectopic Multiple Live Births                   Home Medications    Prior to Admission medications   Medication Sig Start Date End Date Taking? Authorizing Provider  buPROPion (WELLBUTRIN XL) 150 MG 24 hr tablet Take 1 tablet (150 mg total) by mouth daily. For depression 07/02/16   Lindell Spar I, NP  gabapentin (NEURONTIN) 400 MG capsule Take 1 capsule (400 mg total) by mouth at bedtime. For agitation 07/01/16   Encarnacion Slates, NP  gabapentin (NEURONTIN) 400 MG capsule Take 2 capsules (800 mg total) by mouth 2 times daily at 12 noon and 4 pm. For agitation/substance withdrawal symptoms. 07/01/16   Lindell Spar I, NP  hydrOXYzine (ATARAX/VISTARIL) 25 MG tablet Take 1 tablet (25 mg total) by mouth every 6 (six) hours as needed for anxiety. 07/01/16   Lindell Spar I, NP  nicotine (NICODERM CQ - DOSED IN MG/24 HOURS) 21 mg/24hr patch Place 1 patch (21 mg total) onto the skin daily. For smoking ceasation 07/01/16   Lindell Spar I, NP  pantoprazole (PROTONIX) 40 MG tablet Take 1 tablet (40 mg total) by mouth 2 (two) times daily. For acid reflux 07/01/16   Lindell Spar I, NP  QUEtiapine (SEROQUEL) 100 MG tablet Take 1 tablet (100 mg total) by mouth daily. For mood control 07/02/16   Lindell Spar I, NP    QUEtiapine (SEROQUEL) 50 MG tablet Take 5 tablets (250 mg total) by mouth at bedtime. For mood control 07/01/16   Lindell Spar I, NP  traZODone (DESYREL) 100 MG tablet Take 1 tablet (100 mg total) by mouth at bedtime as needed for sleep. 07/01/16   Lindell Spar I, NP  venlafaxine XR (EFFEXOR-XR) 150 MG 24 hr capsule Take 1 capsule (150 mg total) by mouth daily. For depression 07/02/16   Encarnacion Slates, NP    Family History Family History  Problem Relation Age of Onset  . Heart disease Mother   . Pancreatic cancer Father 59    Social History Social History  Substance Use Topics  . Smoking status: Heavy Tobacco Smoker    Packs/day: 0.50    Types: Cigarettes  . Smokeless tobacco: Never Used     Comment: pt reports '4 cigarettes per day."  . Alcohol use No     Comment: occasionally     Allergies   Patient has no known allergies.   Review of Systems Review of Systems  Constitutional: Negative for activity change, appetite change, chills and fever.  HENT: Negative for facial swelling, sore throat and trouble swallowing.   Respiratory: Negative for chest tightness, shortness of breath and wheezing.   Musculoskeletal: Negative for neck pain and neck stiffness.  Skin: Positive for rash. Negative for wound.  Neurological: Negative for dizziness, weakness, numbness and headaches.  All other systems reviewed and are negative.    Physical Exam Updated Vital Signs BP 104/64 (BP Location: Left Arm)   Pulse 87   Temp 98.3 F (36.8 C) (Oral)   Resp 18   Ht 5\' 4"  (1.626 m)   Wt 117.9 kg (260 lb)   LMP 09/09/2016   SpO2 92%   BMI 44.63 kg/m   Physical Exam  Constitutional: She is oriented to person, place, and time. She appears well-developed and well-nourished. No distress.  HENT:  Head: Normocephalic and atraumatic.  Mouth/Throat: Oropharynx is clear and moist.  Neck: Normal range of motion. Neck supple.  Cardiovascular: Normal rate, regular rhythm, normal heart sounds and  intact distal pulses.   No murmur heard. Pulmonary/Chest: Effort normal and breath sounds normal. No respiratory distress.  Musculoskeletal: She exhibits no edema or tenderness.  Lymphadenopathy:    She has no cervical adenopathy.  Neurological: She is alert and oriented to person, place, and time. She exhibits normal muscle tone. Coordination normal.  Skin: Skin is warm. Capillary refill takes less than 2 seconds. Rash noted. There is erythema.  Multiple, erythematous papules in scattered and linear patterns to the bilateral upper and lower extremities.  Trunk  is spared.  No edema, vesicles, or pustules.    Nursing note and vitals reviewed.    ED Treatments / Results  Labs (all labs ordered are listed, but only abnormal results are displayed) Labs Reviewed  BASIC METABOLIC PANEL - Abnormal; Notable for the following:       Result Value   Sodium 148 (*)    Glucose, Bld 141 (*)    Creatinine, Ser 1.10 (*)    All other components within normal limits  CBC WITH DIFFERENTIAL/PLATELET    EKG  EKG Interpretation None       Radiology No results found.  Procedures Procedures (including critical care time)  Medications Ordered in ED Medications  diphenhydrAMINE (BENADRYL) capsule 25 mg (25 mg Oral Given 09/13/16 2057)  hydrOXYzine (ATARAX/VISTARIL) tablet 25 mg (25 mg Oral Given 09/13/16 2244)  predniSONE (DELTASONE) tablet 60 mg (60 mg Oral Given 09/13/16 2244)  sodium chloride 0.9 % bolus 1,000 mL (0 mLs Intravenous Stopped 09/13/16 2331)     Initial Impression / Assessment and Plan / ED Course  I have reviewed the triage vital signs and the nursing notes.  Pertinent labs & imaging results that were available during my care of the patient were reviewed by me and considered in my medical decision making (see chart for details).     Upon exam, pt reports feeling better after lying down.  BP improved.    Creatinine slightly elevated, likely from mild dehydration.  IVF's  given.  Pt reports feeling better.  Appears stable for d/c, agrees to increase fluid intake.  Rash appears c/w insect bites, will prescribe prednisone, vistaril for itching.  Pt agrees to close PCP f/u to recheck kidney function.  Return precautions discussed.   Final Clinical Impressions(s) / ED Diagnoses   Final diagnoses:  Insect bite, initial encounter  Dehydration    New Prescriptions New Prescriptions   No medications on file     Kem Parkinson, PA-C 09/14/16 [place], [place], DO 09/15/16 1410

## 2016-09-13 NOTE — ED Notes (Signed)
Pt alert & oriented x4, stable gait. Patient given discharge instructions, paperwork & prescription(s). Patient  instructed to stop at the registration desk to finish any additional paperwork. Patient verbalized understanding. Pt left department w/ no further questions. 

## 2016-09-13 NOTE — ED Notes (Signed)
Patient and Mother given soda's per request

## 2016-09-13 NOTE — ED Triage Notes (Signed)
Pt c/o rash all over body and states she had a prednisone shot x 2 days ago for the same. During triage she states she is going to pass out and is sweaty.

## 2016-09-13 NOTE — ED Notes (Signed)
Pt reports rash & itching for the past 3 days. Pt says when she walked in to the ER felt like she was going to pass out. Pt states fell s better after getting into the bed. IV started & 250 ml bolus given.

## 2016-10-02 ENCOUNTER — Emergency Department (HOSPITAL_COMMUNITY)
Admission: EM | Admit: 2016-10-02 | Discharge: 2016-10-02 | Disposition: A | Discharge status: Home / Self Care | Payer: Medicaid Other | Attending: Emergency Medicine | Admitting: Emergency Medicine

## 2016-10-02 ENCOUNTER — Encounter (HOSPITAL_COMMUNITY): Payer: Self-pay | Admitting: *Deleted

## 2016-10-02 DIAGNOSIS — R350 Frequency of micturition: Secondary | ICD-10-CM | POA: Insufficient documentation

## 2016-10-02 DIAGNOSIS — Z79899 Other long term (current) drug therapy: Secondary | ICD-10-CM | POA: Diagnosis not present

## 2016-10-02 DIAGNOSIS — R109 Unspecified abdominal pain: Secondary | ICD-10-CM | POA: Insufficient documentation

## 2016-10-02 DIAGNOSIS — M545 Low back pain: Secondary | ICD-10-CM | POA: Insufficient documentation

## 2016-10-02 DIAGNOSIS — F17210 Nicotine dependence, cigarettes, uncomplicated: Secondary | ICD-10-CM | POA: Insufficient documentation

## 2016-10-02 LAB — URINALYSIS, ROUTINE W REFLEX MICROSCOPIC
Bacteria, UA: NONE SEEN
Bilirubin Urine: NEGATIVE
GLUCOSE, UA: NEGATIVE mg/dL
Ketones, ur: NEGATIVE mg/dL
LEUKOCYTES UA: NEGATIVE
NITRITE: NEGATIVE
PH: 5 (ref 5.0–8.0)
PROTEIN: NEGATIVE mg/dL
SPECIFIC GRAVITY, URINE: 1.014 (ref 1.005–1.030)

## 2016-10-02 LAB — CBC
HEMATOCRIT: 35.4 % — AB (ref 36.0–46.0)
HEMOGLOBIN: 11.6 g/dL — AB (ref 12.0–15.0)
MCH: 29.5 pg (ref 26.0–34.0)
MCHC: 32.8 g/dL (ref 30.0–36.0)
MCV: 90.1 fL (ref 78.0–100.0)
Platelets: 255 10*3/uL (ref 150–400)
RBC: 3.93 MIL/uL (ref 3.87–5.11)
RDW: 12.9 % (ref 11.5–15.5)
WBC: 6.7 10*3/uL (ref 4.0–10.5)

## 2016-10-02 LAB — BASIC METABOLIC PANEL
Anion gap: 8 (ref 5–15)
BUN: 13 mg/dL (ref 6–20)
CALCIUM: 8.4 mg/dL — AB (ref 8.9–10.3)
CHLORIDE: 99 mmol/L — AB (ref 101–111)
CO2: 27 mmol/L (ref 22–32)
CREATININE: 1.28 mg/dL — AB (ref 0.44–1.00)
GFR calc non Af Amer: 55 mL/min — ABNORMAL LOW (ref >60)
GLUCOSE: 102 mg/dL — AB (ref 65–99)
Potassium: 4 mmol/L (ref 3.5–5.1)
Sodium: 134 mmol/L — ABNORMAL LOW (ref 135–145)

## 2016-10-02 MED ORDER — HYDROMORPHONE HCL 1 MG/ML IJ SOLN
1.0000 mg | Freq: Once | INTRAMUSCULAR | Status: AC
  Administered 2016-10-02: 1 mg via INTRAVENOUS
  Filled 2016-10-02: qty 1

## 2016-10-02 MED ORDER — ONDANSETRON HCL 4 MG/2ML IJ SOLN
4.0000 mg | Freq: Once | INTRAMUSCULAR | Status: AC
  Administered 2016-10-02: 4 mg via INTRAVENOUS
  Filled 2016-10-02: qty 2

## 2016-10-02 MED ORDER — OXYCODONE-ACETAMINOPHEN 5-325 MG PO TABS: 1.0000 | ORAL_TABLET | ORAL | 0 refills | Status: DC | PRN

## 2016-10-02 MED ORDER — KETOROLAC TROMETHAMINE 30 MG/ML IJ SOLN
30.0000 mg | Freq: Once | INTRAMUSCULAR | Status: AC
  Administered 2016-10-02: 30 mg via INTRAVENOUS
  Filled 2016-10-02: qty 1

## 2016-10-02 NOTE — ED Triage Notes (Signed)
Pt states left flank pain that started a couple hours ago.

## 2016-10-02 NOTE — ED Provider Notes (Signed)
[facility] DEPT Provider Note   CSN: [id number] Arrival date & time: 10/02/16  0226     History   Chief Complaint Chief Complaint  Patient presents with  . Flank Pain    HPI Haley Brady is a 31 y.o. female.  Patient presents to the ER for evaluation of left flank pain. Patient reports that she had left lower back pain earlier tonight. She was awakened from sleep with severe, sharp and stabbing pains in the left lower abdomen and groin area, consistent with previous renal colic episode she has had secondary to kidney stones.since the pain began, she has now noticed urinary frequency. She has not noticed hematuria or dysuria.      Past Medical History:  Diagnosis Date  . Abnormal pap   . Asymptomatic varicose veins   . Chronic back pain   . Chronic nausea   . DDD (degenerative disc disease)   . Depression   . Esophageal reflux   . Ingrowing nail   . Migraines   . Panic disorder without agoraphobia   . Renal disorder   . Vaginal Pap smear, abnormal     Patient Active Problem List   Diagnosis Date Noted  . OCD (obsessive compulsive disorder) 06/24/2016  . Cocaine use disorder, severe, dependence ([place]) 06/24/2016  . Opioid use disorder, severe, dependence ([place]) 06/24/2016  . Cannabis use disorder, severe, dependence ([place]) 06/24/2016  . Stimulant abuse 06/24/2016  . Depression   . Polysubstance dependence including opioid drug with daily use ([place]) 04/12/2016  . MDD (major depressive disorder), recurrent severe, without psychosis ([place]) 04/11/2016  . Dyspepsia 08/26/2014  . Chronic nausea 05/28/2011  . URINALYSIS, ABNORMAL 08/06/2006  . MRSA INFECTION 05/23/2006  . OBESITY NOS 05/23/2006  . Anxiety 05/23/2006  . MIGRAINE HEADACHE 05/23/2006  . GERD 05/23/2006  . LOW BACK PAIN, CHRONIC 05/23/2006    Past Surgical History:  Procedure Laterality Date  . BRAVO [place] STUDY  05/2011   on nexium 40mg  daily 217 episodes of reflux, Demeester score day 1: 51.9 and  Day 2: 24.8.   Marland Kitchen CHOLECYSTECTOMY    . ESOPHAGOGASTRODUODENOSCOPY  06/11/2011    SMALL HIATAL HERNIA/Moderate gastritis/  BURNING CHESTPAIN: GERD, NERD, OR NON-ULCER DYSPEPSIA/ Nl esophagus  . TONSILLECTOMY      OB History    Gravida Para Term Preterm AB Living   2 1       1    SAB TAB Ectopic Multiple Live Births                   Home Medications    Prior to Admission medications   Medication Sig Start Date End Date Taking? Authorizing Provider  buPROPion (WELLBUTRIN XL) 150 MG 24 hr tablet Take 1 tablet (150 mg total) by mouth daily. For depression 07/02/16  Yes Lindell Spar I, NP  gabapentin (NEURONTIN) 400 MG capsule Take 1 capsule (400 mg total) by mouth at bedtime. For agitation 07/01/16  Yes Lindell Spar I, NP  gabapentin (NEURONTIN) 400 MG capsule Take 2 capsules (800 mg total) by mouth 2 times daily at 12 noon and 4 pm. For agitation/substance withdrawal symptoms. 07/01/16  Yes Lindell Spar I, NP  hydrOXYzine (ATARAX/VISTARIL) 25 MG tablet Take 1 tablet (25 mg total) by mouth every 6 (six) hours. 09/13/16  Yes Triplett, Tammy, PA-C  nicotine (NICODERM CQ - DOSED IN MG/24 HOURS) 21 mg/24hr patch Place 1 patch (21 mg total) onto the skin daily. For smoking ceasation 07/01/16  Yes Encarnacion Slates, NP  pantoprazole (PROTONIX) 40 MG tablet Take 1 tablet (40 mg total) by mouth 2 (two) times daily. For acid reflux 07/01/16  Yes Nwoko, Herbert Pun I, NP  predniSONE (DELTASONE) 10 MG tablet Take 6 tablets day one, 5 tablets day two, 4 tablets day three, 3 tablets day four, 2 tablets day five, then 1 tablet day six 09/13/16  Yes Triplett, Tammy, PA-C  QUEtiapine (SEROQUEL) 100 MG tablet Take 1 tablet (100 mg total) by mouth daily. For mood control 07/02/16  Yes Nwoko, Herbert Pun I, NP  QUEtiapine (SEROQUEL) 50 MG tablet Take 5 tablets (250 mg total) by mouth at bedtime. For mood control 07/01/16  Yes Lindell Spar I, NP  traZODone (DESYREL) 100 MG tablet Take 1 tablet (100 mg total) by mouth at bedtime as needed for  sleep. 07/01/16  Yes Lindell Spar I, NP  venlafaxine XR (EFFEXOR-XR) 150 MG 24 hr capsule Take 1 capsule (150 mg total) by mouth daily. For depression 07/02/16  Yes Encarnacion Slates, NP    Family History Family History  Problem Relation Age of Onset  . Heart disease Mother   . Pancreatic cancer Father 48    Social History Social History  Substance Use Topics  . Smoking status: Heavy Tobacco Smoker    Packs/day: 0.50    Types: Cigarettes  . Smokeless tobacco: Never Used     Comment: pt reports '4 cigarettes per day."  . Alcohol use No     Comment: occasionally     Allergies   Patient has no known allergies.   Review of Systems Review of Systems  Genitourinary: Positive for flank pain and frequency.  Musculoskeletal: Positive for back pain.  All other systems reviewed and are negative.    Physical Exam Updated Vital Signs BP (!) 154/75 (BP Location: Left Wrist)   Pulse 98   Temp 98.6 F (37 C) (Oral)   Resp (!) 22   Ht 5\' 4"  (1.626 m)   Wt 119.3 kg (263 lb)   LMP 09/09/2016   SpO2 97%   BMI 45.14 kg/m   Physical Exam  Constitutional: She is oriented to person, place, and time. She appears well-developed and well-nourished. No distress.  HENT:  Head: Normocephalic and atraumatic.  Right Ear: Hearing normal.  Left Ear: Hearing normal.  Nose: Nose normal.  Mouth/Throat: Oropharynx is clear and moist and mucous membranes are normal.  Eyes: Pupils are equal, round, and reactive to light. Conjunctivae and EOM are normal.  Neck: Normal range of motion. Neck supple.  Cardiovascular: Regular rhythm, S1 normal and S2 normal.  Exam reveals no gallop and no friction rub.   No murmur heard. Pulmonary/Chest: Effort normal and breath sounds normal. No respiratory distress. She exhibits no tenderness.  Abdominal: Soft. Normal appearance and bowel sounds are normal. There is no hepatosplenomegaly. There is no tenderness. There is no rebound, no guarding, no tenderness at  McBurney's point and negative Murphy's sign. No hernia.  Musculoskeletal: Normal range of motion.  Neurological: She is alert and oriented to person, place, and time. She has normal strength. No cranial nerve deficit or sensory deficit. Coordination normal. GCS eye subscore is 4. GCS verbal subscore is 5. GCS motor subscore is 6.  Skin: Skin is warm, dry and intact. No rash noted. No cyanosis.  Psychiatric: She has a normal mood and affect. Her speech is normal and behavior is normal. Thought content normal.  Nursing note and vitals reviewed.    ED Treatments / Results  Labs (all labs ordered  are listed, but only abnormal results are displayed) Labs Reviewed  URINALYSIS, ROUTINE W REFLEX MICROSCOPIC - Abnormal; Notable for the following:       Result Value   Hgb urine dipstick SMALL (*)    Squamous Epithelial / LPF 0-5 (*)    All other components within normal limits  CBC - Abnormal; Notable for the following:    Hemoglobin 11.6 (*)    HCT 35.4 (*)    All other components within normal limits  BASIC METABOLIC PANEL - Abnormal; Notable for the following:    Sodium 134 (*)    Chloride 99 (*)    Glucose, Bld 102 (*)    Creatinine, Ser 1.28 (*)    Calcium 8.4 (*)    GFR calc non Af Amer 55 (*)    All other components within normal limits    EKG  EKG Interpretation None       Radiology No results found.  Procedures Procedures (including critical care time)  Medications Ordered in ED Medications  ondansetron (ZOFRAN) injection 4 mg (4 mg Intravenous Given 10/02/16 0331)  HYDROmorphone (DILAUDID) injection 1 mg (1 mg Intravenous Given 10/02/16 0333)  ketorolac (TORADOL) 30 MG/ML injection 30 mg (30 mg Intravenous Given 10/02/16 0332)     Initial Impression / Assessment and Plan / ED Course  I have reviewed the triage vital signs and the nursing notes.  Pertinent labs & imaging results that were available during my care of the patient were reviewed by me and considered in my  medical decision making (see chart for details).     Patient presents to the ER with complaints ofleft flank pain. She initially had pain in the lower back, now pain is in the left groin area. This is similar to kidney stone pain that she has had in the past. She does have some urine symptoms as well, complaining of urinary frequency not the pain has started. This is likely bladder spasm. Urinalysis does not suggest infection. Abdominal exam was benign, nontender. Do not suspect ovarian torsion or gynecologic etiology of the pain. Treat with analgesia, follow up.  Final Clinical Impressions(s) / ED Diagnoses   Final diagnoses:  Left flank pain    New Prescriptions New Prescriptions   No medications on file     Orpah Greek, MD 10/02/16 [phone]

## 2016-10-02 NOTE — ED Notes (Signed)
Pt alert & oriented x4, stable gait. Patient given discharge instructions, paperwork & prescription(s). Patient  instructed to stop at the registration desk to finish any additional paperwork. Patient verbalized understanding. Pt left department w/ no further questions. 

## 2016-10-24 ENCOUNTER — Other Ambulatory Visit: Payer: Self-pay | Admitting: Adult Health

## 2016-11-04 ENCOUNTER — Other Ambulatory Visit: Payer: Self-pay | Admitting: Adult Health

## 2017-04-11 ENCOUNTER — Emergency Department (HOSPITAL_COMMUNITY)
Admission: EM | Admit: 2017-04-11 | Discharge: 2017-04-12 | Disposition: A | Discharge status: Home / Self Care | Payer: Medicaid Other | Attending: Emergency Medicine | Admitting: Emergency Medicine

## 2017-04-11 ENCOUNTER — Encounter (HOSPITAL_COMMUNITY): Payer: Self-pay | Admitting: Emergency Medicine

## 2017-04-11 ENCOUNTER — Other Ambulatory Visit: Payer: Self-pay

## 2017-04-11 DIAGNOSIS — R300 Dysuria: Secondary | ICD-10-CM | POA: Diagnosis present

## 2017-04-11 DIAGNOSIS — F17210 Nicotine dependence, cigarettes, uncomplicated: Secondary | ICD-10-CM | POA: Insufficient documentation

## 2017-04-11 DIAGNOSIS — N3001 Acute cystitis with hematuria: Secondary | ICD-10-CM

## 2017-04-11 DIAGNOSIS — Z79899 Other long term (current) drug therapy: Secondary | ICD-10-CM | POA: Insufficient documentation

## 2017-04-11 LAB — URINALYSIS, ROUTINE W REFLEX MICROSCOPIC
Bacteria, UA: NONE SEEN
Bilirubin Urine: NEGATIVE
Glucose, UA: NEGATIVE mg/dL
KETONES UR: NEGATIVE mg/dL
Nitrite: NEGATIVE
PH: 5 (ref 5.0–8.0)
Protein, ur: 100 mg/dL — AB
Specific Gravity, Urine: 1.041 — ABNORMAL HIGH (ref 1.005–1.030)

## 2017-04-11 LAB — PREGNANCY, URINE: PREG TEST UR: NEGATIVE

## 2017-04-11 MED ORDER — SULFAMETHOXAZOLE-TRIMETHOPRIM 800-160 MG PO TABS
1.0000 | ORAL_TABLET | Freq: Once | ORAL | Status: AC
  Administered 2017-04-11: 1 via ORAL
  Filled 2017-04-11: qty 1

## 2017-04-11 MED ORDER — LIDOCAINE HCL 2 % EX GEL
1.0000 "application " | Freq: Once | CUTANEOUS | Status: AC
  Administered 2017-04-11: 1 via TOPICAL
  Filled 2017-04-11: qty 10

## 2017-04-11 MED ORDER — SULFAMETHOXAZOLE-TRIMETHOPRIM 800-160 MG PO TABS: 1.0000 | ORAL_TABLET | Freq: Two times a day (BID) | ORAL | 0 refills | Status: AC

## 2017-04-11 NOTE — Discharge Instructions (Signed)
Take the entire course of antibiotics.  Make sure you are drinking plenty of fluids.  Get rechecked if you continues to have symptoms, or if you develop fever, vomiting or flank pain.

## 2017-04-11 NOTE — ED Notes (Signed)
Pt given urine cup, instructed to return to registration desk upon urination

## 2017-04-11 NOTE — ED Triage Notes (Signed)
Pt reports painful and frequent urination x 3 days, no relief with AZO

## 2017-04-12 ENCOUNTER — Emergency Department (HOSPITAL_COMMUNITY): Payer: Medicaid Other

## 2017-04-12 MED ORDER — HYDROCODONE-ACETAMINOPHEN 5-325 MG PO TABS
1.0000 | ORAL_TABLET | Freq: Once | ORAL | Status: AC
  Administered 2017-04-12: 1 via ORAL
  Filled 2017-04-12: qty 1

## 2017-04-12 NOTE — ED Provider Notes (Signed)
[facility] EMERGENCY DEPARTMENT Provider Note   CSN: [id number] Arrival date & time: 04/11/17  2101     History   Chief Complaint Chief Complaint  Patient presents with  . Urinary Tract Infection    uti like symptoms    HPI Haley Brady is a 32 y.o. female with a history as outlined below presenting with dysuria of 3 days duration which has not improved by taking AZO and some leftover antibiotics (does not know the name, but was a small blue tablet) which she took yesterday and today.  She denies fevers chills, n/v, abdominal pain but endorses low suprapubic pain and burning pain at her urethra which was only with urination but has become constant today. She denies vaginal discharge, vaginal or perineal rash or irritation.  The history is provided by the patient.    Past Medical History:  Diagnosis Date  . Abnormal pap   . Asymptomatic varicose veins   . Chronic back pain   . Chronic nausea   . DDD (degenerative disc disease)   . Depression   . Esophageal reflux   . Ingrowing nail   . Migraines   . Panic disorder without agoraphobia   . Renal disorder   . Vaginal Pap smear, abnormal     Patient Active Problem List   Diagnosis Date Noted  . OCD (obsessive compulsive disorder) 06/24/2016  . Cocaine use disorder, severe, dependence ([place]) 06/24/2016  . Opioid use disorder, severe, dependence ([place]) 06/24/2016  . Cannabis use disorder, severe, dependence ([place]) 06/24/2016  . Stimulant abuse ([place]) 06/24/2016  . Depression   . Polysubstance dependence including opioid drug with daily use ([place]) 04/12/2016  . MDD (major depressive disorder), recurrent severe, without psychosis ([place]) 04/11/2016  . Dyspepsia 08/26/2014  . Chronic nausea 05/28/2011  . URINALYSIS, ABNORMAL 08/06/2006  . MRSA INFECTION 05/23/2006  . OBESITY NOS 05/23/2006  . Anxiety 05/23/2006  . MIGRAINE HEADACHE 05/23/2006  . GERD 05/23/2006  . LOW BACK PAIN, CHRONIC 05/23/2006    Past Surgical  History:  Procedure Laterality Date  . BRAVO [place] STUDY  05/2011   on nexium 40mg  daily 217 episodes of reflux, Demeester score day 1: 51.9 and Day 2: 24.8.   Marland Kitchen CHOLECYSTECTOMY    . ESOPHAGOGASTRODUODENOSCOPY  06/11/2011    SMALL HIATAL HERNIA/Moderate gastritis/  BURNING CHESTPAIN: GERD, NERD, OR NON-ULCER DYSPEPSIA/ Nl esophagus  . TONSILLECTOMY      OB History    Gravida Para Term Preterm AB Living   2 1       1    SAB TAB Ectopic Multiple Live Births                   Home Medications    Prior to Admission medications   Medication Sig Start Date End Date Taking? Authorizing Provider  buPROPion (WELLBUTRIN XL) 150 MG 24 hr tablet Take 1 tablet (150 mg total) by mouth daily. For depression 07/02/16   Lindell Spar I, NP  gabapentin (NEURONTIN) 400 MG capsule Take 1 capsule (400 mg total) by mouth at bedtime. For agitation 07/01/16   Lindell Spar I, NP  gabapentin (NEURONTIN) 400 MG capsule Take 2 capsules (800 mg total) by mouth 2 times daily at 12 noon and 4 pm. For agitation/substance withdrawal symptoms. 07/01/16   Lindell Spar I, NP  hydrOXYzine (ATARAX/VISTARIL) 25 MG tablet Take 1 tablet (25 mg total) by mouth every 6 (six) hours. 09/13/16   Triplett, Tammy, PA-C  nicotine (NICODERM CQ - DOSED IN  MG/24 HOURS) 21 mg/24hr patch Place 1 patch (21 mg total) onto the skin daily. For smoking ceasation 07/01/16   Lindell Spar I, NP  oxyCODONE-acetaminophen (PERCOCET) 5-325 MG tablet Take 1 tablet by mouth every 4 (four) hours as needed. 10/02/16   Orpah Greek, MD  pantoprazole (PROTONIX) 40 MG tablet Take 1 tablet (40 mg total) by mouth 2 (two) times daily. For acid reflux 07/01/16   Lindell Spar I, NP  predniSONE (DELTASONE) 10 MG tablet Take 6 tablets day one, 5 tablets day two, 4 tablets day three, 3 tablets day four, 2 tablets day five, then 1 tablet day six 09/13/16   Triplett, Tammy, PA-C  QUEtiapine (SEROQUEL) 100 MG tablet Take 1 tablet (100 mg total) by mouth daily. For mood control  07/02/16   Lindell Spar I, NP  QUEtiapine (SEROQUEL) 50 MG tablet Take 5 tablets (250 mg total) by mouth at bedtime. For mood control 07/01/16   Lindell Spar I, NP  sulfamethoxazole-trimethoprim (BACTRIM DS,SEPTRA DS) 800-160 MG tablet Take 1 tablet by mouth 2 (two) times daily for 7 days. 04/11/17 04/18/17  Evalee Jefferson, PA-C  traZODone (DESYREL) 100 MG tablet Take 1 tablet (100 mg total) by mouth at bedtime as needed for sleep. 07/01/16   Lindell Spar I, NP  venlafaxine XR (EFFEXOR-XR) 150 MG 24 hr capsule Take 1 capsule (150 mg total) by mouth daily. For depression 07/02/16   Encarnacion Slates, NP    Family History Family History  Problem Relation Age of Onset  . Heart disease Mother   . Pancreatic cancer Father 15    Social History Social History   Tobacco Use  . Smoking status: Current Every Day Smoker    Packs/day: 0.50    Types: Cigarettes  . Smokeless tobacco: Never Used  . Tobacco comment: pt reports '4 cigarettes per day."  Substance Use Topics  . Alcohol use: No    Comment: occasionally  . Drug use: No     Allergies   Patient has no known allergies.   Review of Systems Review of Systems  Constitutional: Negative for fever.  HENT: Negative for congestion and sore throat.   Eyes: Negative.   Respiratory: Negative for chest tightness and shortness of breath.   Cardiovascular: Negative for chest pain.  Gastrointestinal: Negative for abdominal distention, abdominal pain, constipation, diarrhea, nausea and vomiting.  Genitourinary: Positive for dysuria and frequency. Negative for pelvic pain, urgency, vaginal discharge and vaginal pain.  Musculoskeletal: Positive for back pain. Negative for arthralgias, joint swelling and neck pain.  Skin: Negative.  Negative for rash and wound.  Neurological: Negative for dizziness, weakness, light-headedness, numbness and headaches.  Psychiatric/Behavioral: Negative.      Physical Exam Updated Vital Signs BP (!) 148/88 (BP Location: Right  Arm)   Pulse 78   Temp 98.6 F (37 C) (Oral)   Resp 17   Ht 5\' 4"  (1.626 m)   Wt 108.9 kg (240 lb)   SpO2 98%   BMI 41.20 kg/m   Physical Exam  Constitutional: She appears well-developed and well-nourished.  HENT:  Head: Normocephalic and atraumatic.  Eyes: Conjunctivae are normal.  Neck: Normal range of motion.  Cardiovascular: Normal rate, regular rhythm, normal heart sounds and intact distal pulses.  Pulmonary/Chest: Effort normal and breath sounds normal. She has no wheezes.  Abdominal: Soft. Bowel sounds are normal. She exhibits no mass. There is no tenderness. There is no rebound, no guarding and no CVA tenderness.  Musculoskeletal: Normal range of motion.  Neurological: She is alert.  Skin: Skin is warm and dry.  Psychiatric: She has a normal mood and affect.  Nursing note and vitals reviewed.    ED Treatments / Results  Labs (all labs ordered are listed, but only abnormal results are displayed) Labs Reviewed  URINALYSIS, ROUTINE W REFLEX MICROSCOPIC - Abnormal; Notable for the following components:      Result Value   APPearance HAZY (*)    Specific Gravity, Urine 1.041 (*)    Hgb urine dipstick MODERATE (*)    Protein, ur 100 (*)    Leukocytes, UA MODERATE (*)    Squamous Epithelial / LPF 6-30 (*)    All other components within normal limits  PREGNANCY, URINE    EKG  EKG Interpretation None       Radiology Ct Renal Stone Study  Result Date: 04/12/2017 CLINICAL DATA:  Acute onset of left flank pain, radiating to the left groin. Nausea and vomiting. Difficulty urinating. EXAM: CT ABDOMEN AND PELVIS WITHOUT CONTRAST TECHNIQUE: Multidetector CT imaging of the abdomen and pelvis was performed following the standard protocol without IV contrast. COMPARISON:  CT of the abdomen and pelvis performed 04/11/2016 FINDINGS: Lower chest: The visualized lung bases are grossly clear. The visualized portions of the mediastinum are unremarkable. Hepatobiliary: The liver  is unremarkable in appearance. The patient is status post cholecystectomy, with clips noted at the gallbladder fossa. The common bile duct remains normal in caliber. Pancreas: The pancreas is within normal limits. Spleen: The spleen is unremarkable in appearance. Adrenals/Urinary Tract: The adrenal glands are unremarkable in appearance. Scattered nonobstructing bilateral renal stones measure up to 4 mm in size. There is no evidence of hydronephrosis. No obstructing ureteral stones are seen. No perinephric stranding is seen. Stomach/Bowel: The stomach is unremarkable in appearance. The small bowel is within normal limits. The appendix is normal in caliber, without evidence of appendicitis. The colon is unremarkable in appearance. Vascular/Lymphatic: The abdominal aorta is unremarkable in appearance. The inferior vena cava is grossly unremarkable. No retroperitoneal lymphadenopathy is seen. No pelvic sidewall lymphadenopathy is identified. Reproductive: The bladder is mildly distended and within normal limits. The uterus is grossly unremarkable in appearance. The ovaries are relatively symmetric. No suspicious adnexal masses are seen. Other: No additional soft tissue abnormalities are seen. Musculoskeletal: No acute osseous abnormalities are identified. The visualized musculature is unremarkable in appearance. IMPRESSION: 1. No acute abnormality seen within the abdomen or pelvis. 2. Scattered nonobstructing bilateral renal stones measure up to 4 mm in size. Electronically Signed   By: Garald Balding M.D.   On: 04/12/2017 00:36    Procedures Procedures (including critical care time)  Medications Ordered in ED Medications  lidocaine (XYLOCAINE) 2 % jelly 1 application (1 application Topical Given 04/11/17 2342)  sulfamethoxazole-trimethoprim (BACTRIM DS,SEPTRA DS) 800-160 MG per tablet 1 tablet (1 tablet Oral Given 04/11/17 2349)  HYDROcodone-acetaminophen (NORCO/VICODIN) 5-325 MG per tablet 1 tablet (1 tablet  Oral Given 04/12/17 0004)     Initial Impression / Assessment and Plan / ED Course  I have reviewed the triage vital signs and the nursing notes.  Pertinent labs & imaging results that were available during my care of the patient were reviewed by me and considered in my medical decision making (see chart for details).     Pt with hx suggesting acute cystitis.  Her urine has TNTC rbc and wbc's, no nitrites or bacteria but she also took an unknown abx x 2 days.  I favor treating her given sx,  prescribed bactrim with first dose given here.    Prior to dc, pt divulged that she has a hx of kidney stones and now endorses bilateral flank pain.  Review of chart - pt had CT abd 3/18 with multiple bilateral renal stones.  Will scan pt to assess for possible ureteral stone as source of sx.   12:47 AM Ct imaging complete, bilateral renal stones up to 70mm, no ureteral stones. Will tx for uti as originally planned.  Final Clinical Impressions(s) / ED Diagnoses   Final diagnoses:  Acute cystitis with hematuria    ED Discharge Orders        Ordered    sulfamethoxazole-trimethoprim (BACTRIM DS,SEPTRA DS) 800-160 MG tablet  2 times daily     04/11/17 2344       Evalee Jefferson, PA-C 04/12/17 [place], [place], DO 04/13/17 1718

## 2017-06-13 ENCOUNTER — Other Ambulatory Visit: Payer: Self-pay

## 2017-06-13 ENCOUNTER — Encounter (HOSPITAL_COMMUNITY): Payer: Self-pay

## 2017-06-13 ENCOUNTER — Emergency Department (HOSPITAL_COMMUNITY): Payer: Medicaid Other

## 2017-06-13 ENCOUNTER — Emergency Department (HOSPITAL_COMMUNITY)
Admission: EM | Admit: 2017-06-13 | Discharge: 2017-06-13 | Disposition: A | Discharge status: Home / Self Care | Payer: Medicaid Other | Attending: Emergency Medicine | Admitting: Emergency Medicine

## 2017-06-13 DIAGNOSIS — M545 Low back pain: Secondary | ICD-10-CM | POA: Diagnosis not present

## 2017-06-13 DIAGNOSIS — Z79899 Other long term (current) drug therapy: Secondary | ICD-10-CM | POA: Insufficient documentation

## 2017-06-13 DIAGNOSIS — F17210 Nicotine dependence, cigarettes, uncomplicated: Secondary | ICD-10-CM | POA: Diagnosis not present

## 2017-06-13 DIAGNOSIS — R110 Nausea: Secondary | ICD-10-CM | POA: Diagnosis not present

## 2017-06-13 DIAGNOSIS — R1084 Generalized abdominal pain: Secondary | ICD-10-CM | POA: Insufficient documentation

## 2017-06-13 LAB — CBC WITH DIFFERENTIAL/PLATELET
BASOS ABS: 0 10*3/uL (ref 0.0–0.1)
BASOS PCT: 0 %
EOS ABS: 0.1 10*3/uL (ref 0.0–0.7)
Eosinophils Relative: 1 %
HCT: 39.4 % (ref 36.0–46.0)
Hemoglobin: 12.8 g/dL (ref 12.0–15.0)
Lymphocytes Relative: 27 %
Lymphs Abs: 2.4 10*3/uL (ref 0.7–4.0)
MCH: 28.3 pg (ref 26.0–34.0)
MCHC: 32.5 g/dL (ref 30.0–36.0)
MCV: 87 fL (ref 78.0–100.0)
Monocytes Absolute: 0.7 10*3/uL (ref 0.1–1.0)
Monocytes Relative: 8 %
Neutro Abs: 5.8 10*3/uL (ref 1.7–7.7)
Neutrophils Relative %: 64 %
Platelets: 280 10*3/uL (ref 150–400)
RBC: 4.53 MIL/uL (ref 3.87–5.11)
RDW: 12.9 % (ref 11.5–15.5)
WBC: 9 10*3/uL (ref 4.0–10.5)

## 2017-06-13 LAB — COMPREHENSIVE METABOLIC PANEL
ALBUMIN: 4.2 g/dL (ref 3.5–5.0)
ALK PHOS: 51 U/L (ref 38–126)
ALT: 18 U/L (ref 14–54)
AST: 15 U/L (ref 15–41)
Anion gap: 9 (ref 5–15)
BUN: 13 mg/dL (ref 6–20)
CALCIUM: 9.2 mg/dL (ref 8.9–10.3)
CO2: 25 mmol/L (ref 22–32)
CREATININE: 1.22 mg/dL — AB (ref 0.44–1.00)
Chloride: 103 mmol/L (ref 101–111)
GFR calc Af Amer: >60 mL/min (ref >60)
GFR calc non Af Amer: 58 mL/min — ABNORMAL LOW (ref >60)
GLUCOSE: 97 mg/dL (ref 65–99)
Potassium: 3.8 mmol/L (ref 3.5–5.1)
SODIUM: 137 mmol/L (ref 135–145)
Total Bilirubin: 0.5 mg/dL (ref 0.3–1.2)
Total Protein: 7.8 g/dL (ref 6.5–8.1)

## 2017-06-13 LAB — URINALYSIS, ROUTINE W REFLEX MICROSCOPIC
BILIRUBIN URINE: NEGATIVE
GLUCOSE, UA: NEGATIVE mg/dL
Hgb urine dipstick: NEGATIVE
KETONES UR: NEGATIVE mg/dL
LEUKOCYTES UA: NEGATIVE
Nitrite: NEGATIVE
PH: 6 (ref 5.0–8.0)
Protein, ur: NEGATIVE mg/dL
Specific Gravity, Urine: 1.002 — ABNORMAL LOW (ref 1.005–1.030)

## 2017-06-13 LAB — LIPASE, BLOOD: Lipase: 34 U/L (ref 11–51)

## 2017-06-13 LAB — POC URINE PREG, ED: Preg Test, Ur: NEGATIVE

## 2017-06-13 MED ORDER — HYDROMORPHONE HCL 1 MG/ML IJ SOLN
1.0000 mg | INTRAMUSCULAR | Status: DC | PRN
  Administered 2017-06-13: 1 mg via INTRAMUSCULAR
  Filled 2017-06-13: qty 1

## 2017-06-13 MED ORDER — ONDANSETRON 8 MG PO TBDP
8.0000 mg | ORAL_TABLET | Freq: Once | ORAL | Status: AC
  Administered 2017-06-13: 8 mg via ORAL
  Filled 2017-06-13: qty 1

## 2017-06-13 MED ORDER — DICYCLOMINE HCL 10 MG/ML IM SOLN
20.0000 mg | Freq: Once | INTRAMUSCULAR | Status: AC
  Administered 2017-06-13: 20 mg via INTRAMUSCULAR
  Filled 2017-06-13: qty 2

## 2017-06-13 MED ORDER — GI COCKTAIL ~~LOC~~
30.0000 mL | Freq: Once | ORAL | Status: AC
  Administered 2017-06-13: 30 mL via ORAL
  Filled 2017-06-13: qty 30

## 2017-06-13 NOTE — ED Provider Notes (Signed)
[facility] EMERGENCY DEPARTMENT Provider Note   CSN: [id number] Arrival date & time: 06/13/17  4098     History   Chief Complaint Chief Complaint  Patient presents with  . Abdominal Pain    HPI Haley Brady is a 32 y.o. female.  HPI  Pt was seen at [place].  Per pt, c/o gradual onset and persistence of constant generalized abd "pain" since yesterday at approximately 2100.  Has been associated with nausea and acute flair of her chronic right sided low back pain.  Describes the abd pain as "sharp." Endorses hx of kidney stones.  Denies vomiting/diarrhea, no fevers, no flank pain, no rash, no CP/SOB, no black or blood in stools, no dysuria/hematuria, no vaginal bleeding/discharge.      Past Medical History:  Diagnosis Date  . Abnormal pap   . Asymptomatic varicose veins   . Chronic back pain   . Chronic nausea   . DDD (degenerative disc disease)   . Depression   . Esophageal reflux   . Ingrowing nail   . Migraines   . Panic disorder without agoraphobia   . Renal disorder   . Vaginal Pap smear, abnormal     Patient Active Problem List   Diagnosis Date Noted  . OCD (obsessive compulsive disorder) 06/24/2016  . Cocaine use disorder, severe, dependence ([place]) 06/24/2016  . Opioid use disorder, severe, dependence ([place]) 06/24/2016  . Cannabis use disorder, severe, dependence ([place]) 06/24/2016  . Stimulant abuse ([place]) 06/24/2016  . Depression   . Polysubstance dependence including opioid drug with daily use ([place]) 04/12/2016  . MDD (major depressive disorder), recurrent severe, without psychosis ([place]) 04/11/2016  . Dyspepsia 08/26/2014  . Chronic nausea 05/28/2011  . URINALYSIS, ABNORMAL 08/06/2006  . MRSA INFECTION 05/23/2006  . OBESITY NOS 05/23/2006  . Anxiety 05/23/2006  . MIGRAINE HEADACHE 05/23/2006  . GERD 05/23/2006  . LOW BACK PAIN, CHRONIC 05/23/2006    Past Surgical History:  Procedure Laterality Date  . BRAVO [place] STUDY  05/2011   on nexium 40mg  daily 217  episodes of reflux, Demeester score day 1: 51.9 and Day 2: 24.8.   Marland Kitchen CHOLECYSTECTOMY    . ESOPHAGOGASTRODUODENOSCOPY  06/11/2011    SMALL HIATAL HERNIA/Moderate gastritis/  BURNING CHESTPAIN: GERD, NERD, OR NON-ULCER DYSPEPSIA/ Nl esophagus  . TONSILLECTOMY       OB History    Gravida  2   Para  1   Term      Preterm      AB      Living  1     SAB      TAB      Ectopic      Multiple      Live Births               Home Medications    Prior to Admission medications   Medication Sig Start Date End Date Taking? Authorizing Provider  buPROPion (WELLBUTRIN XL) 150 MG 24 hr tablet Take 1 tablet (150 mg total) by mouth daily. For depression 07/02/16   Lindell Spar I, NP  gabapentin (NEURONTIN) 400 MG capsule Take 1 capsule (400 mg total) by mouth at bedtime. For agitation 07/01/16   Lindell Spar I, NP  gabapentin (NEURONTIN) 400 MG capsule Take 2 capsules (800 mg total) by mouth 2 times daily at 12 noon and 4 pm. For agitation/substance withdrawal symptoms. 07/01/16   Lindell Spar I, NP  hydrOXYzine (ATARAX/VISTARIL) 25 MG tablet Take 1 tablet (25 mg total) by  mouth every 6 (six) hours. 09/13/16   Triplett, Tammy, PA-C  nicotine (NICODERM CQ - DOSED IN MG/24 HOURS) 21 mg/24hr patch Place 1 patch (21 mg total) onto the skin daily. For smoking ceasation 07/01/16   Lindell Spar I, NP  oxyCODONE-acetaminophen (PERCOCET) 5-325 MG tablet Take 1 tablet by mouth every 4 (four) hours as needed. 10/02/16   Orpah Greek, MD  pantoprazole (PROTONIX) 40 MG tablet Take 1 tablet (40 mg total) by mouth 2 (two) times daily. For acid reflux 07/01/16   Lindell Spar I, NP  predniSONE (DELTASONE) 10 MG tablet Take 6 tablets day one, 5 tablets day two, 4 tablets day three, 3 tablets day four, 2 tablets day five, then 1 tablet day six 09/13/16   Triplett, Tammy, PA-C  QUEtiapine (SEROQUEL) 100 MG tablet Take 1 tablet (100 mg total) by mouth daily. For mood control 07/02/16   Lindell Spar I, NP    QUEtiapine (SEROQUEL) 50 MG tablet Take 5 tablets (250 mg total) by mouth at bedtime. For mood control 07/01/16   Lindell Spar I, NP  traZODone (DESYREL) 100 MG tablet Take 1 tablet (100 mg total) by mouth at bedtime as needed for sleep. 07/01/16   Lindell Spar I, NP  venlafaxine XR (EFFEXOR-XR) 150 MG 24 hr capsule Take 1 capsule (150 mg total) by mouth daily. For depression 07/02/16   Encarnacion Slates, NP    Family History Family History  Problem Relation Age of Onset  . Heart disease Mother   . Pancreatic cancer Father 18    Social History Social History   Tobacco Use  . Smoking status: Current Every Day Smoker    Packs/day: 0.50    Types: Cigarettes  . Smokeless tobacco: Never Used  . Tobacco comment: pt reports '4 cigarettes per day."  Substance Use Topics  . Alcohol use: No    Comment: occasionally  . Drug use: No     Allergies   Patient has no known allergies.   Review of Systems Review of Systems ROS: Statement: All systems negative except as marked or noted in the HPI; Constitutional: Negative for fever and chills. ; ; Eyes: Negative for eye pain, redness and discharge. ; ; ENMT: Negative for ear pain, hoarseness, nasal congestion, sinus pressure and sore throat. ; ; Cardiovascular: Negative for chest pain, palpitations, diaphoresis, dyspnea and peripheral edema. ; ; Respiratory: Negative for cough, wheezing and stridor. ; ; Gastrointestinal: +nausea, abd pain. Negative for vomiting, diarrhea, blood in stool, hematemesis, jaundice and rectal bleeding. . ; ; Genitourinary: Negative for dysuria and hematuria. ; ; GYN:  No pelvic pain, no vaginal bleeding, no vaginal discharge, no vulvar pain. ;; Musculoskeletal: +back pain. Negative for neck pain. Negative for swelling and trauma.; ; Skin: Negative for pruritus, rash, abrasions, blisters, bruising and skin lesion.; ; Neuro: Negative for headache, lightheadedness and neck stiffness. Negative for weakness, altered level of  consciousness, altered mental status, extremity weakness, paresthesias, involuntary movement, seizure and syncope.       Physical Exam Updated Vital Signs BP (!) 146/99 (BP Location: Left Arm)   Pulse 96   Temp 98.1 F (36.7 C) (Oral)   Resp 18   Ht 5\' 4"  (1.626 m)   Wt 110.2 kg (243 lb)   LMP 06/09/2017   SpO2 98%   BMI 41.71 kg/m   Physical Exam 0730: Physical examination:  Nursing notes reviewed; Vital signs and O2 SAT reviewed;  Constitutional: Well developed, Well nourished, Well hydrated, Uncomfortable appearing.; Head:  Normocephalic, atraumatic; Eyes: EOMI, PERRL, No scleral icterus; ENMT: Mouth and pharynx normal, Mucous membranes moist; Neck: Supple, Full range of motion, No lymphadenopathy; Cardiovascular: Regular rate and rhythm, No gallop; Respiratory: Breath sounds clear & equal bilaterally, No wheezes.  Speaking full sentences with ease, Normal respiratory effort/excursion; Chest: Nontender, Movement normal; Abdomen: Soft, +diffuse tenderness to palp. No rebound or guarding. Nondistended, Normal bowel sounds; Genitourinary: No CVA tenderness; Spine:  No midline CS, TS, LS tenderness. +TTP right lumbar paraspinal muscles.;; Extremities: Peripheral pulses normal, No tenderness, No edema, No calf edema or asymmetry.; Neuro: AA&Ox3, Major CN grossly intact.  Speech clear. No gross focal motor or sensory deficits in extremities.; Skin: Color normal, Warm, Dry.   ED Treatments / Results  Labs (all labs ordered are listed, but only abnormal results are displayed)   EKG None  Radiology   Procedures Procedures (including critical care time)  Medications Ordered in ED Medications  HYDROmorphone (DILAUDID) injection 1 mg (has no administration in time range)  ondansetron (ZOFRAN-ODT) disintegrating tablet 8 mg (has no administration in time range)     Initial Impression / Assessment and Plan / ED Course  I have reviewed the triage vital signs and the nursing  notes.  Pertinent labs & imaging results that were available during my care of the patient were reviewed by me and considered in my medical decision making (see chart for details).  MDM Reviewed: previous chart, nursing note and vitals Reviewed previous: labs Interpretation: labs and CT scan   Results for orders placed or performed during the hospital encounter of 06/13/17  CBC with Differential  Result Value Ref Range   WBC 9.0 4.0 - 10.5 K/uL   RBC 4.53 3.87 - 5.11 MIL/uL   Hemoglobin 12.8 12.0 - 15.0 g/dL   HCT 39.4 36.0 - 46.0 %   MCV 87.0 78.0 - 100.0 fL   MCH 28.3 26.0 - 34.0 pg   MCHC 32.5 30.0 - 36.0 g/dL   RDW 12.9 11.5 - 15.5 %   Platelets 280 150 - 400 K/uL   Neutrophils Relative % 64 %   Neutro Abs 5.8 1.7 - 7.7 K/uL   Lymphocytes Relative 27 %   Lymphs Abs 2.4 0.7 - 4.0 K/uL   Monocytes Relative 8 %   Monocytes Absolute 0.7 0.1 - 1.0 K/uL   Eosinophils Relative 1 %   Eosinophils Absolute 0.1 0.0 - 0.7 K/uL   Basophils Relative 0 %   Basophils Absolute 0.0 0.0 - 0.1 K/uL  Comprehensive metabolic panel  Result Value Ref Range   Sodium 137 135 - 145 mmol/L   Potassium 3.8 3.5 - 5.1 mmol/L   Chloride 103 101 - 111 mmol/L   CO2 25 22 - 32 mmol/L   Glucose, Bld 97 65 - 99 mg/dL   BUN 13 6 - 20 mg/dL   Creatinine, Ser 1.22 (H) 0.44 - 1.00 mg/dL   Calcium 9.2 8.9 - 10.3 mg/dL   Total Protein 7.8 6.5 - 8.1 g/dL   Albumin 4.2 3.5 - 5.0 g/dL   AST 15 15 - 41 U/L   ALT 18 14 - 54 U/L   Alkaline Phosphatase 51 38 - 126 U/L   Total Bilirubin 0.5 0.3 - 1.2 mg/dL   GFR calc non Af Amer 58 (L) >60 mL/min   GFR calc Af Amer >60 >60 mL/min   Anion gap 9 5 - 15  Urinalysis, Routine w reflex microscopic  Result Value Ref Range   Color, Urine COLORLESS (A)  YELLOW   APPearance CLEAR CLEAR   Specific Gravity, Urine 1.002 (L) 1.005 - 1.030   pH 6.0 5.0 - 8.0   Glucose, UA NEGATIVE NEGATIVE mg/dL   Hgb urine dipstick NEGATIVE NEGATIVE   Bilirubin Urine NEGATIVE NEGATIVE    Ketones, ur NEGATIVE NEGATIVE mg/dL   Protein, ur NEGATIVE NEGATIVE mg/dL   Nitrite NEGATIVE NEGATIVE   Leukocytes, UA NEGATIVE NEGATIVE  Lipase, blood  Result Value Ref Range   Lipase 34 11 - 51 U/L  POC Urine Pregnancy, ED (do NOT order at [facility])  Result Value Ref Range   Preg Test, Ur NEGATIVE NEGATIVE   Ct Renal Stone Study Result Date: 06/13/2017 CLINICAL DATA:  Abdominal pain and nausea EXAM: CT ABDOMEN AND PELVIS WITHOUT CONTRAST TECHNIQUE: Multidetector CT imaging of the abdomen and pelvis was performed following the standard protocol without oral or IV contrast. COMPARISON:  April 12, 2017 FINDINGS: Lower chest: Lung bases are clear. Hepatobiliary: No focal liver lesions are appreciable on this noncontrast enhanced study. Gallbladder is absent. There is no biliary duct dilatation. Pancreas: No pancreatic mass or inflammatory focus. Spleen: No splenic lesions are evident. Adrenals/Urinary Tract: Right adrenal appears normal. There is an 8 x 7 mm left adrenal adenoma. There is a cyst in the posterior mid right kidney measuring 1.4 x 1.2 cm. There is no hydronephrosis on either side. There are multiple 1-2 mm calculi scattered throughout the right kidney. There are multiple calculi throughout the left kidney measuring between 1 and 3 mm in size. There is no appreciable ureteral calculus on either side. Urinary bladder is midline with wall thickness within normal limits. Stomach/Bowel: There are occasional sigmoid diverticula without diverticulitis. There is no appreciable bowel wall or mesenteric thickening. No evident bowel obstruction. No free air or portal venous air. Vascular/Lymphatic: No abdominal aortic aneurysm. No vascular lesions are appreciable. No evident adenopathy in the abdomen or pelvis. Reproductive: The uterus is anteverted.  No evident pelvic mass. Other: Appendix appears normal. There is no ascites or abscess in the abdomen or pelvis. There is a minimal ventral hernia  containing only fat. Musculoskeletal: There is degenerative change at L5-S1 with vacuum phenomenon at this level. No blastic or lytic bone lesions are evident. No intramuscular or abdominal wall lesions are evident. IMPRESSION: 1. Multiple small nonobstructing calculi evident in each kidney. No ureteral calculus or hydronephrosis on either side. 2. No appreciable bowel obstruction. There are occasional sigmoid diverticula without diverticulitis. No abscess. Appendix appears normal. 3.  Gallbladder absent. 4.  Minimal ventral hernia containing only fat. 5.  Subcentimeter left adrenal adenoma, a benign finding. Electronically Signed   By: Lowella Grip III M.D.   On: 06/13/2017 08:57    1040:  Pt re-evaluated: feels better after meds, has tol PO well while in the ED without N/V, and no stooling while in the ED.  Pt apparently left the ED after my re-exam. Eloped.     Final Clinical Impressions(s) / ED Diagnoses   Final diagnoses:  None    ED Discharge Orders    None       Francine Graven, DO 06/15/17 2132

## 2017-06-13 NOTE — ED Notes (Signed)
Patient is not in room

## 2017-06-13 NOTE — ED Triage Notes (Signed)
Pt c/o upper and lower abd pain since last night.  Reports nausea, no vomiting.   Denies urinary symptoms.  Denies abnormal vaginal bleeding or discharge.  Denies diarrhea.  LBM was yesterday and was normal per pt.  LMP was 4 days ago.

## 2017-07-08 ENCOUNTER — Encounter (HOSPITAL_COMMUNITY): Payer: Self-pay | Admitting: Emergency Medicine

## 2017-07-08 ENCOUNTER — Emergency Department (HOSPITAL_COMMUNITY)
Admission: EM | Admit: 2017-07-08 | Discharge: 2017-07-08 | Disposition: A | Discharge status: Home / Self Care | Payer: Medicaid Other | Attending: Emergency Medicine | Admitting: Emergency Medicine

## 2017-07-08 ENCOUNTER — Other Ambulatory Visit: Payer: Self-pay

## 2017-07-08 DIAGNOSIS — G8929 Other chronic pain: Secondary | ICD-10-CM | POA: Diagnosis not present

## 2017-07-08 DIAGNOSIS — M545 Low back pain: Secondary | ICD-10-CM | POA: Diagnosis present

## 2017-07-08 DIAGNOSIS — Z79899 Other long term (current) drug therapy: Secondary | ICD-10-CM | POA: Insufficient documentation

## 2017-07-08 DIAGNOSIS — M5441 Lumbago with sciatica, right side: Secondary | ICD-10-CM | POA: Insufficient documentation

## 2017-07-08 DIAGNOSIS — F17210 Nicotine dependence, cigarettes, uncomplicated: Secondary | ICD-10-CM | POA: Diagnosis not present

## 2017-07-08 LAB — URINALYSIS, ROUTINE W REFLEX MICROSCOPIC
Bacteria, UA: NONE SEEN
Bilirubin Urine: NEGATIVE
GLUCOSE, UA: NEGATIVE mg/dL
Ketones, ur: NEGATIVE mg/dL
Leukocytes, UA: NEGATIVE
NITRITE: NEGATIVE
PH: 6 (ref 5.0–8.0)
Protein, ur: NEGATIVE mg/dL
SPECIFIC GRAVITY, URINE: 1.009 (ref 1.005–1.030)

## 2017-07-08 LAB — POC URINE PREG, ED: Preg Test, Ur: NEGATIVE

## 2017-07-08 MED ORDER — DICLOFENAC SODIUM 75 MG PO TBEC: 75.0000 mg | DELAYED_RELEASE_TABLET | Freq: Two times a day (BID) | ORAL | 0 refills | Status: DC

## 2017-07-08 MED ORDER — CYCLOBENZAPRINE HCL 10 MG PO TABS
10.0000 mg | ORAL_TABLET | Freq: Once | ORAL | Status: AC
  Administered 2017-07-08: 10 mg via ORAL
  Filled 2017-07-08: qty 1

## 2017-07-08 MED ORDER — CYCLOBENZAPRINE HCL 10 MG PO TABS: 10.0000 mg | ORAL_TABLET | Freq: Three times a day (TID) | ORAL | 0 refills | Status: DC | PRN

## 2017-07-08 MED ORDER — PREDNISONE 10 MG PO TABS: ORAL_TABLET | ORAL | 0 refills | Status: DC

## 2017-07-08 NOTE — Discharge Instructions (Addendum)
You may alternate ice and heat on and off to your back.  Do not take additional over-the-counter anti-inflammatory such as ibuprofen, Excedrin or Goody powders while taking the medications prescribed to you today.  Follow up with your pain management provider.

## 2017-07-08 NOTE — ED Notes (Addendum)
Pt with right lower back pain that started this morning, pt states it hurts to stand up straight or walk.  Pt ambulated from triage to room without difficultly.  Pt denies burning with urination. Pt states hx of kidney stones in the past.

## 2017-07-08 NOTE — ED Provider Notes (Signed)
[facility] EMERGENCY DEPARTMENT Provider Note   CSN: [id number] Arrival date & time: 07/08/17  1730     History   Chief Complaint Chief Complaint  Patient presents with  . Back Pain    HPI Haley Brady is a 32 y.o. female.  HPI   Haley Brady is a 32 y.o. female who presents to the Emergency Department complaining of right-sided low back pain since this morning.  Patient has history of same.  Pain is associated with movement and weightbearing, improves at rest.  Pain intermittently radiates into the lateral right thigh.  No known injury, numbness or weakness of the extremities, urine or bowel changes, abdominal pain, fever or chills.  She is taking over-the-counter anti-inflammatory medication without relief.   Past Medical History:  Diagnosis Date  . Abnormal pap   . Asymptomatic varicose veins   . Chronic back pain   . Chronic nausea   . DDD (degenerative disc disease)   . Depression   . Esophageal reflux   . Ingrowing nail   . Migraines   . Panic disorder without agoraphobia   . Renal disorder   . Vaginal Pap smear, abnormal     Patient Active Problem List   Diagnosis Date Noted  . OCD (obsessive compulsive disorder) 06/24/2016  . Cocaine use disorder, severe, dependence ([place]) 06/24/2016  . Opioid use disorder, severe, dependence ([place]) 06/24/2016  . Cannabis use disorder, severe, dependence ([place]) 06/24/2016  . Stimulant abuse ([place]) 06/24/2016  . Depression   . Polysubstance dependence including opioid drug with daily use ([place]) 04/12/2016  . MDD (major depressive disorder), recurrent severe, without psychosis ([place]) 04/11/2016  . Dyspepsia 08/26/2014  . Chronic nausea 05/28/2011  . URINALYSIS, ABNORMAL 08/06/2006  . MRSA INFECTION 05/23/2006  . OBESITY NOS 05/23/2006  . Anxiety 05/23/2006  . MIGRAINE HEADACHE 05/23/2006  . GERD 05/23/2006  . LOW BACK PAIN, CHRONIC 05/23/2006    Past Surgical History:  Procedure Laterality Date  . BRAVO [place]  STUDY  05/2011   on nexium 40mg  daily 217 episodes of reflux, Demeester score day 1: 51.9 and Day 2: 24.8.   Marland Kitchen CHOLECYSTECTOMY    . ESOPHAGOGASTRODUODENOSCOPY  06/11/2011    SMALL HIATAL HERNIA/Moderate gastritis/  BURNING CHESTPAIN: GERD, NERD, OR NON-ULCER DYSPEPSIA/ Nl esophagus  . TONSILLECTOMY       OB History    Gravida  2   Para  1   Term      Preterm      AB      Living  1     SAB      TAB      Ectopic      Multiple      Live Births               Home Medications    Prior to Admission medications   Medication Sig Start Date End Date Taking? Authorizing Provider  aspirin-acetaminophen-caffeine (EXCEDRIN EXTRA STRENGTH) [phone] MG tablet Take 3 tablets by mouth once as needed for headache.   Yes [provider]  Aspirin-Salicylamide-Caffeine (BC HEADACHE) 325-95-16 MG TABS Take 2 Packages by mouth once as needed (for pain).   Yes [provider]  cloNIDine (CATAPRES) 0.1 MG tablet Take 0.1 mg by mouth 2 (two) times daily.   Yes [provider]  gabapentin (NEURONTIN) 300 MG capsule Take 300 mg by mouth at bedtime.   Yes [provider]  ibuprofen (ADVIL,MOTRIN) 200 MG tablet Take 800 mg by mouth once  as needed.   Yes [provider]  omeprazole (PRILOSEC) 40 MG capsule Take 40 mg by mouth 2 (two) times daily.   Yes [provider]    Family History Family History  Problem Relation Age of Onset  . Heart disease Mother   . Pancreatic cancer Father 40    Social History Social History   Tobacco Use  . Smoking status: Current Every Day Smoker    Packs/day: 0.50    Types: Cigarettes  . Smokeless tobacco: Never Used  . Tobacco comment: pt reports '4 cigarettes per day."  Substance Use Topics  . Alcohol use: No    Comment: occasionally  . Drug use: No     Allergies   Patient has no known allergies.   Review of Systems Review of Systems  Constitutional: Negative for fever.  Respiratory:  Negative for shortness of breath.   Gastrointestinal: Negative for abdominal pain, constipation and vomiting.  Genitourinary: Negative for decreased urine volume, difficulty urinating, dysuria, flank pain and hematuria.  Musculoskeletal: Positive for back pain. Negative for joint swelling.  Skin: Negative for rash.  Neurological: Negative for weakness and numbness.  All other systems reviewed and are negative.    Physical Exam Updated Vital Signs BP (!) 133/91 (BP Location: Right Arm)   Pulse 64   Temp 98.2 F (36.8 C) (Oral)   Resp 18   Ht 5\' 3"  (1.6 m)   Wt 110.2 kg (243 lb)   LMP 07/01/2017   SpO2 100%   BMI 43.05 kg/m   Physical Exam  Constitutional: She is oriented to person, place, and time. She appears well-developed and well-nourished. No distress.  HENT:  Head: Normocephalic and atraumatic.  Neck: Normal range of motion. Neck supple.  Cardiovascular: Normal rate, regular rhythm and intact distal pulses.  DP pulses are strong and palpable bilaterally  Pulmonary/Chest: Effort normal and breath sounds normal. No respiratory distress.  Abdominal: Soft. She exhibits no distension. There is no tenderness. There is no guarding.  Musculoskeletal: She exhibits tenderness. She exhibits no edema.       Lumbar back: She exhibits tenderness and pain. She exhibits normal range of motion, no swelling, no deformity, no laceration and normal pulse.  ttp of the right lumbar paraspinal muscles and SI joint.  No spinal tenderness.  Pt has 5/5 strength against resistance of bilateral lower extremities.  Straight leg raise is positive on the right.  Hip flexors and extensors are intact   Neurological: She is alert and oriented to person, place, and time. She has normal strength. No sensory deficit. She exhibits normal muscle tone. Coordination and gait normal.  Reflex Scores:      Patellar reflexes are 2+ on the right side and 2+ on the left side.      Achilles reflexes are 2+ on the right  side and 2+ on the left side. Skin: Skin is warm and dry. Capillary refill takes less than 2 seconds. No rash noted.  Nursing note and vitals reviewed.    ED Treatments / Results  Labs (all labs ordered are listed, but only abnormal results are displayed) Labs Reviewed  URINALYSIS, ROUTINE W REFLEX MICROSCOPIC - Abnormal; Notable for the following components:      Result Value   Color, Urine STRAW (*)    Hgb urine dipstick MODERATE (*)    All other components within normal limits  POC URINE PREG, ED    EKG None  Radiology No results found.  Procedures Procedures (including critical  care time)  Medications Ordered in ED Medications  cyclobenzaprine (FLEXERIL) tablet 10 mg (has no administration in time range)     Initial Impression / Assessment and Plan / ED Course  I have reviewed the triage vital signs and the nursing notes.  Pertinent labs & imaging results that were available during my care of the patient were reviewed by me and considered in my medical decision making (see chart for details).     Controlled Substance Prescriptions [place] Controlled Substance Registry consulted? Yes, I have consulted the [place] Controlled Substances Registry for this patient, patient received Sublocade, filled on 06/30/2017 for 28-day supply  Patient well-appearing.  Vitals reviewed . ambulates with a steady gait.  No focal neuro deficits on exam.  Pain is felt to be musculoskeletal.  No concerning symptoms for cauda equina or spinal abscess.   Final Clinical Impressions(s) / ED Diagnoses   Final diagnoses:  Chronic right-sided low back pain with right-sided sciatica    ED Discharge Orders        Ordered    predniSONE (DELTASONE) 10 MG tablet     07/08/17 1918    diclofenac (VOLTAREN) 75 MG EC tablet  2 times daily     07/08/17 1918    cyclobenzaprine (FLEXERIL) 10 MG tablet  3 times daily PRN     07/08/17 1918       Kem Parkinson, PA-C 07/10/17 [place], [place],  DO 07/12/17 2338

## 2017-07-08 NOTE — ED Triage Notes (Signed)
Patient complaining of lower back pain starting today. Denies dysuria. Denies injury.

## 2017-08-11 ENCOUNTER — Encounter (HOSPITAL_COMMUNITY): Payer: Self-pay | Admitting: Emergency Medicine

## 2017-08-11 ENCOUNTER — Other Ambulatory Visit: Payer: Self-pay

## 2017-08-11 ENCOUNTER — Emergency Department (HOSPITAL_COMMUNITY)
Admission: EM | Admit: 2017-08-11 | Discharge: 2017-08-11 | Disposition: A | Discharge status: Home / Self Care | Payer: Medicaid Other | Attending: Emergency Medicine | Admitting: Emergency Medicine

## 2017-08-11 DIAGNOSIS — Z79899 Other long term (current) drug therapy: Secondary | ICD-10-CM | POA: Diagnosis not present

## 2017-08-11 DIAGNOSIS — F319 Bipolar disorder, unspecified: Secondary | ICD-10-CM | POA: Insufficient documentation

## 2017-08-11 DIAGNOSIS — F1910 Other psychoactive substance abuse, uncomplicated: Secondary | ICD-10-CM

## 2017-08-11 DIAGNOSIS — F1310 Sedative, hypnotic or anxiolytic abuse, uncomplicated: Secondary | ICD-10-CM | POA: Insufficient documentation

## 2017-08-11 DIAGNOSIS — F1410 Cocaine abuse, uncomplicated: Secondary | ICD-10-CM | POA: Diagnosis not present

## 2017-08-11 DIAGNOSIS — F1210 Cannabis abuse, uncomplicated: Secondary | ICD-10-CM | POA: Insufficient documentation

## 2017-08-11 DIAGNOSIS — F329 Major depressive disorder, single episode, unspecified: Secondary | ICD-10-CM | POA: Insufficient documentation

## 2017-08-11 DIAGNOSIS — F17210 Nicotine dependence, cigarettes, uncomplicated: Secondary | ICD-10-CM | POA: Insufficient documentation

## 2017-08-11 DIAGNOSIS — F1110 Opioid abuse, uncomplicated: Secondary | ICD-10-CM | POA: Diagnosis not present

## 2017-08-11 DIAGNOSIS — F419 Anxiety disorder, unspecified: Secondary | ICD-10-CM | POA: Insufficient documentation

## 2017-08-11 LAB — CBC WITH DIFFERENTIAL/PLATELET
BASOS ABS: 0 10*3/uL (ref 0.0–0.1)
Basophils Relative: 0 %
EOS ABS: 0.1 10*3/uL (ref 0.0–0.7)
EOS PCT: 1 %
HCT: 41.9 % (ref 36.0–46.0)
HEMOGLOBIN: 13.6 g/dL (ref 12.0–15.0)
LYMPHS PCT: 26 %
Lymphs Abs: 2.5 10*3/uL (ref 0.7–4.0)
MCH: 28.7 pg (ref 26.0–34.0)
MCHC: 32.5 g/dL (ref 30.0–36.0)
MCV: 88.4 fL (ref 78.0–100.0)
Monocytes Absolute: 0.8 10*3/uL (ref 0.1–1.0)
Monocytes Relative: 9 %
NEUTROS PCT: 64 %
Neutro Abs: 6.2 10*3/uL (ref 1.7–7.7)
PLATELETS: 366 10*3/uL (ref 150–400)
RBC: 4.74 MIL/uL (ref 3.87–5.11)
RDW: 13.5 % (ref 11.5–15.5)
WBC: 9.6 10*3/uL (ref 4.0–10.5)

## 2017-08-11 LAB — BASIC METABOLIC PANEL
ANION GAP: 9 (ref 5–15)
BUN: 9 mg/dL (ref 6–20)
CO2: 25 mmol/L (ref 22–32)
Calcium: 8.9 mg/dL (ref 8.9–10.3)
Chloride: 106 mmol/L (ref 98–111)
Creatinine, Ser: 1.18 mg/dL — ABNORMAL HIGH (ref 0.44–1.00)
GFR calc Af Amer: >60 mL/min (ref >60)
Glucose, Bld: 121 mg/dL — ABNORMAL HIGH (ref 70–99)
POTASSIUM: 3.3 mmol/L — AB (ref 3.5–5.1)
SODIUM: 140 mmol/L (ref 135–145)

## 2017-08-11 LAB — RAPID URINE DRUG SCREEN, HOSP PERFORMED
AMPHETAMINES: NOT DETECTED
Benzodiazepines: POSITIVE — AB
Cocaine: POSITIVE — AB
Opiates: POSITIVE — AB
Tetrahydrocannabinol: POSITIVE — AB

## 2017-08-11 LAB — ETHANOL

## 2017-08-11 LAB — PREGNANCY, URINE: PREG TEST UR: NEGATIVE

## 2017-08-11 MED ORDER — BUPROPION HCL ER (XL) 150 MG PO TB24
ORAL_TABLET | ORAL | Status: AC
  Filled 2017-08-11: qty 2

## 2017-08-11 MED ORDER — LORAZEPAM 1 MG PO TABS
1.0000 mg | ORAL_TABLET | Freq: Once | ORAL | Status: AC
  Administered 2017-08-11: 1 mg via ORAL
  Filled 2017-08-11: qty 1

## 2017-08-11 MED ORDER — ASENAPINE MALEATE 5 MG SL SUBL
10.0000 mg | SUBLINGUAL_TABLET | Freq: Once | SUBLINGUAL | Status: AC
  Administered 2017-08-11: 10 mg via SUBLINGUAL
  Filled 2017-08-11: qty 2

## 2017-08-11 MED ORDER — DULOXETINE HCL 30 MG PO CPEP
60.0000 mg | ORAL_CAPSULE | Freq: Once | ORAL | Status: AC
Start: 2017-08-11 — End: 2017-08-11
  Administered 2017-08-11: 60 mg via ORAL
  Filled 2017-08-11: qty 2

## 2017-08-11 MED ORDER — ASENAPINE MALEATE 10 MG SL SUBL: 10.0000 mg | SUBLINGUAL_TABLET | Freq: Every day | SUBLINGUAL | 0 refills | Status: AC | PRN

## 2017-08-11 MED ORDER — LORAZEPAM 1 MG PO TABS: 1.0000 mg | ORAL_TABLET | Freq: Three times a day (TID) | ORAL | 0 refills | Status: AC | PRN

## 2017-08-11 MED ORDER — BUPROPION HCL ER (XL) 300 MG PO TB24
300.0000 mg | ORAL_TABLET | Freq: Every day | ORAL | Status: DC
  Administered 2017-08-11: 300 mg via ORAL

## 2017-08-11 MED ORDER — PANTOPRAZOLE SODIUM 40 MG PO TBEC
40.0000 mg | DELAYED_RELEASE_TABLET | Freq: Every day | ORAL | Status: DC
  Administered 2017-08-11: 40 mg via ORAL
  Filled 2017-08-11: qty 1

## 2017-08-11 MED ORDER — DULOXETINE HCL 60 MG PO CPEP: 60.0000 mg | ORAL_CAPSULE | Freq: Two times a day (BID) | ORAL | 0 refills | Status: AC

## 2017-08-11 MED ORDER — BUPROPION HCL ER (XL) 300 MG PO TB24: 300.0000 mg | ORAL_TABLET | Freq: Every day | ORAL | 0 refills | Status: AC

## 2017-08-11 MED ORDER — OMEPRAZOLE 40 MG PO CPDR: 40.0000 mg | DELAYED_RELEASE_CAPSULE | Freq: Two times a day (BID) | ORAL | 0 refills | Status: AC

## 2017-08-11 NOTE — ED Triage Notes (Signed)
Pt states she uses drugs and has been off all her psych meds. Pt states she needs treatment. Pt denies any si/hi at this time.

## 2017-08-11 NOTE — ED Provider Notes (Signed)
[facility] EMERGENCY DEPARTMENT Provider Note   CSN: [id number] Arrival date & time: 08/11/17  8366     History   Chief Complaint Chief Complaint  Patient presents with  . Medical Clearance    HPI Haley Brady is a 32 y.o. female.   Mental Health Problem  Presenting symptoms: agitation and depression   Presenting symptoms: no bizarre behavior, no delusions, no hallucinations, no homicidal ideas, no paranoid behavior, no self-mutilation, no suicidal thoughts, no suicidal threats and no suicide attempt   Degree of incapacity (severity):  Moderate Duration:  2 weeks Timing:  Constant Progression:  Worsening Chronicity:  Recurrent Context: drug abuse, noncompliance and stressful life event   Treatment compliance:  Untreated Time since last psychoactive medication taken:  2 weeks Relieved by:  None tried Worsened by:  Nothing Ineffective treatments:  None tried   Past Medical History:  Diagnosis Date  . Abnormal pap   . Asymptomatic varicose veins   . Chronic back pain   . Chronic nausea   . DDD (degenerative disc disease)   . Depression   . Esophageal reflux   . Ingrowing nail   . Migraines   . Panic disorder without agoraphobia   . Renal disorder   . Vaginal Pap smear, abnormal     Patient Active Problem List   Diagnosis Date Noted  . OCD (obsessive compulsive disorder) 06/24/2016  . Cocaine use disorder, severe, dependence ([place]) 06/24/2016  . Opioid use disorder, severe, dependence ([place]) 06/24/2016  . Cannabis use disorder, severe, dependence ([place]) 06/24/2016  . Stimulant abuse ([place]) 06/24/2016  . Depression   . Polysubstance dependence including opioid drug with daily use ([place]) 04/12/2016  . MDD (major depressive disorder), recurrent severe, without psychosis ([place]) 04/11/2016  . Dyspepsia 08/26/2014  . Chronic nausea 05/28/2011  . URINALYSIS, ABNORMAL 08/06/2006  . MRSA INFECTION 05/23/2006  . OBESITY NOS 05/23/2006  . Anxiety 05/23/2006  .  MIGRAINE HEADACHE 05/23/2006  . GERD 05/23/2006  . LOW BACK PAIN, CHRONIC 05/23/2006    Past Surgical History:  Procedure Laterality Date  . BRAVO [place] STUDY  05/2011   on nexium 40mg  daily 217 episodes of reflux, Demeester score day 1: 51.9 and Day 2: 24.8.   Marland Kitchen CHOLECYSTECTOMY    . ESOPHAGOGASTRODUODENOSCOPY  06/11/2011    SMALL HIATAL HERNIA/Moderate gastritis/  BURNING CHESTPAIN: GERD, NERD, OR NON-ULCER DYSPEPSIA/ Nl esophagus  . TONSILLECTOMY       OB History    Gravida  2   Para  1   Term      Preterm      AB      Living  1     SAB      TAB      Ectopic      Multiple      Live Births               Home Medications    Prior to Admission medications   Medication Sig Start Date End Date Taking? Authorizing Provider  cloNIDine (CATAPRES) 0.1 MG tablet Take 0.1 mg by mouth 2 (two) times daily.   Yes [provider]  gabapentin (NEURONTIN) 300 MG capsule Take 300 mg by mouth at bedtime.   Yes [provider]  Asenapine Maleate (SAPHRIS) 10 MG SUBL Place 1 tablet (10 mg total) under the tongue daily as needed. 08/11/17   Vanissa Strength, Corene Cornea, MD  buPROPion (WELLBUTRIN XL) 300 MG 24 hr tablet Take 1 tablet (300 mg total) by mouth  daily. 08/11/17   Xian Apostol, Corene Cornea, MD  DULoxetine (CYMBALTA) 60 MG capsule Take 1 capsule (60 mg total) by mouth 2 (two) times daily. 08/11/17   Evelette Hollern, Corene Cornea, MD  LORazepam (ATIVAN) 1 MG tablet Take 1 tablet (1 mg total) by mouth 3 (three) times daily as needed for anxiety. 08/11/17   Cathyann Kilfoyle, Corene Cornea, MD  omeprazole (PRILOSEC) 40 MG capsule Take 1 capsule (40 mg total) by mouth 2 (two) times daily. 08/11/17   Ambar Raphael, Corene Cornea, MD    Family History Family History  Problem Relation Age of Onset  . Heart disease Mother   . Pancreatic cancer Father 4    Social History Social History   Tobacco Use  . Smoking status: Current Every Day Smoker    Packs/day: 0.50    Types: Cigarettes  . Smokeless tobacco: Never Used  . Tobacco  comment: pt reports '4 cigarettes per day."  Substance Use Topics  . Alcohol use: No    Comment: occasionally  . Drug use: No     Allergies   Patient has no known allergies.   Review of Systems Review of Systems  Psychiatric/Behavioral: Positive for agitation. Negative for hallucinations, homicidal ideas, paranoia, self-injury and suicidal ideas.  All other systems reviewed and are negative.    Physical Exam Updated Vital Signs BP (!) 135/118   Pulse (!) 104   Temp 98.1 F (36.7 C) (Oral)   Resp (!) 22   Ht 5\' 4"  (1.626 m)   Wt 105.2 kg (232 lb)   LMP 08/04/2017   SpO2 97%   BMI 39.82 kg/m   Physical Exam  Constitutional: She is oriented to person, place, and time. She appears well-developed and well-nourished.  HENT:  Head: Normocephalic and atraumatic.  Eyes: Conjunctivae and EOM are normal.  Neck: Normal range of motion.  Cardiovascular: Normal rate and regular rhythm.  Pulmonary/Chest: Effort normal and breath sounds normal. No stridor. No respiratory distress.  Abdominal: Soft. She exhibits no distension.  Musculoskeletal: Normal range of motion. She exhibits no edema or deformity.  Neurological: She is alert and oriented to person, place, and time.  Skin: Skin is warm and dry.  Psychiatric: Her mood appears anxious. Her affect is labile. Her speech is rapid and/or pressured. Thought content is not paranoid and not delusional. She expresses no homicidal and no suicidal ideation. She expresses no suicidal plans and no homicidal plans.  Nursing note and vitals reviewed.    ED Treatments / Results  Labs (all labs ordered are listed, but only abnormal results are displayed) Labs Reviewed  RAPID URINE DRUG SCREEN, HOSP PERFORMED - Abnormal; Notable for the following components:      Result Value   Opiates POSITIVE (*)    Cocaine POSITIVE (*)    Benzodiazepines POSITIVE (*)    Tetrahydrocannabinol POSITIVE (*)    Barbiturates   (*)    Value: Result not  available. Reagent lot number recalled by manufacturer.   All other components within normal limits  BASIC METABOLIC PANEL - Abnormal; Notable for the following components:   Potassium 3.3 (*)    Glucose, Bld 121 (*)    Creatinine, Ser 1.18 (*)    All other components within normal limits  PREGNANCY, URINE  ETHANOL  CBC WITH DIFFERENTIAL/PLATELET    EKG None  Radiology No results found.  Procedures Procedures (including critical care time)  Medications Ordered in ED Medications  buPROPion (WELLBUTRIN XL) 24 hr tablet 300 mg (has no administration in time range)  pantoprazole (PROTONIX)  EC tablet 40 mg (40 mg Oral Given 08/11/17 2210)  asenapine (SAPHRIS) sublingual tablet 10 mg (10 mg Sublingual Given 08/11/17 2207)  DULoxetine (CYMBALTA) DR capsule 60 mg (60 mg Oral Given 08/11/17 2207)  LORazepam (ATIVAN) tablet 1 mg (1 mg Oral Given 08/11/17 2207)     Initial Impression / Assessment and Plan / ED Course  I have reviewed the triage vital signs and the nursing notes.  Pertinent labs & imaging results that were available during my care of the patient were reviewed by me and considered in my medical decision making (see chart for details).     Patient not homicidal, suicidal with no hallucinations or other acute indications for inpatient psychiatric care.  Will restart medications have her follow-up with [facility] for outpatient recommendations.  Final Clinical Impressions(s) / ED Diagnoses   Final diagnoses:  Polysubstance abuse ([place])  Anxiety  Bipolar 1 disorder [facility])    ED Discharge Orders        Ordered    Asenapine Maleate (SAPHRIS) 10 MG SUBL  Daily PRN     08/11/17 2156    buPROPion (WELLBUTRIN XL) 300 MG 24 hr tablet  Daily     08/11/17 2156    DULoxetine (CYMBALTA) 60 MG capsule  2 times daily     08/11/17 2156    LORazepam (ATIVAN) 1 MG tablet  3 times daily PRN     08/11/17 2156    omeprazole (PRILOSEC) 40 MG capsule  2 times daily     08/11/17 2156         Chey Rachels, Corene Cornea, MD 08/11/17 2211

## 2017-08-11 NOTE — ED Notes (Signed)
Called no answer

## 2017-08-11 NOTE — ED Notes (Signed)
Pt given water per request for something to drink. Juliann Pulse, [facility] notified of need for Wellbutrin XL 300mg 

## 2017-08-11 NOTE — ED Notes (Signed)
Pt given sprite zero 

## 2017-08-11 NOTE — ED Notes (Signed)
Pt has been wanded by security, police and security have searched pt belongings to ensuring pt and staff safety. Pt was cooperative and had items lying out on bed.

## 2017-08-28 ENCOUNTER — Other Ambulatory Visit: Payer: Self-pay

## 2017-08-28 ENCOUNTER — Emergency Department (HOSPITAL_COMMUNITY): Payer: Medicaid Other

## 2017-08-28 ENCOUNTER — Emergency Department (HOSPITAL_COMMUNITY)
Admission: EM | Admit: 2017-08-28 | Discharge: 2017-08-28 | Disposition: A | Discharge status: Home / Self Care | Payer: Medicaid Other | Attending: Emergency Medicine | Admitting: Emergency Medicine

## 2017-08-28 ENCOUNTER — Encounter (HOSPITAL_COMMUNITY): Payer: Self-pay | Admitting: Emergency Medicine

## 2017-08-28 DIAGNOSIS — K920 Hematemesis: Secondary | ICD-10-CM | POA: Diagnosis present

## 2017-08-28 DIAGNOSIS — Z79899 Other long term (current) drug therapy: Secondary | ICD-10-CM | POA: Diagnosis not present

## 2017-08-28 DIAGNOSIS — K219 Gastro-esophageal reflux disease without esophagitis: Secondary | ICD-10-CM | POA: Insufficient documentation

## 2017-08-28 DIAGNOSIS — F17210 Nicotine dependence, cigarettes, uncomplicated: Secondary | ICD-10-CM | POA: Insufficient documentation

## 2017-08-28 DIAGNOSIS — K2900 Acute gastritis without bleeding: Secondary | ICD-10-CM | POA: Insufficient documentation

## 2017-08-28 DIAGNOSIS — K2100 Gastro-esophageal reflux disease with esophagitis, without bleeding: Secondary | ICD-10-CM

## 2017-08-28 LAB — CBC WITH DIFFERENTIAL/PLATELET
Basophils Absolute: 0 10*3/uL (ref 0.0–0.1)
Basophils Relative: 0 %
Eosinophils Absolute: 0.2 10*3/uL (ref 0.0–0.7)
Eosinophils Relative: 1 %
HCT: 41 % (ref 36.0–46.0)
Hemoglobin: 13.8 g/dL (ref 12.0–15.0)
Lymphocytes Relative: 35 %
Lymphs Abs: 3.6 10*3/uL (ref 0.7–4.0)
MCH: 29.6 pg (ref 26.0–34.0)
MCHC: 33.7 g/dL (ref 30.0–36.0)
MCV: 88 fL (ref 78.0–100.0)
MONOS PCT: 9 %
Monocytes Absolute: 0.9 10*3/uL (ref 0.1–1.0)
Neutro Abs: 5.7 10*3/uL (ref 1.7–7.7)
Neutrophils Relative %: 55 %
Platelets: 334 10*3/uL (ref 150–400)
RBC: 4.66 MIL/uL (ref 3.87–5.11)
RDW: 13.9 % (ref 11.5–15.5)
WBC: 10.4 10*3/uL (ref 4.0–10.5)

## 2017-08-28 LAB — COMPREHENSIVE METABOLIC PANEL
ALBUMIN: 4.1 g/dL (ref 3.5–5.0)
ALT: 30 U/L (ref 0–44)
AST: 22 U/L (ref 15–41)
Alkaline Phosphatase: 51 U/L (ref 38–126)
Anion gap: 8 (ref 5–15)
BUN: 9 mg/dL (ref 6–20)
CO2: 29 mmol/L (ref 22–32)
CREATININE: 0.83 mg/dL (ref 0.44–1.00)
Calcium: 9 mg/dL (ref 8.9–10.3)
Chloride: 105 mmol/L (ref 98–111)
GFR calc Af Amer: >60 mL/min (ref >60)
GFR calc non Af Amer: >60 mL/min (ref >60)
GLUCOSE: 111 mg/dL — AB (ref 70–99)
Potassium: 3.2 mmol/L — ABNORMAL LOW (ref 3.5–5.1)
Sodium: 142 mmol/L (ref 135–145)
Total Bilirubin: 0.3 mg/dL (ref 0.3–1.2)
Total Protein: 7.3 g/dL (ref 6.5–8.1)

## 2017-08-28 LAB — URINALYSIS, ROUTINE W REFLEX MICROSCOPIC
BILIRUBIN URINE: NEGATIVE
Bacteria, UA: NONE SEEN
GLUCOSE, UA: NEGATIVE mg/dL
KETONES UR: NEGATIVE mg/dL
Leukocytes, UA: NEGATIVE
Nitrite: NEGATIVE
Protein, ur: 30 mg/dL — AB
Specific Gravity, Urine: 1.018 (ref 1.005–1.030)
pH: 6 (ref 5.0–8.0)

## 2017-08-28 LAB — SAMPLE TO BLOOD BANK

## 2017-08-28 LAB — PREGNANCY, URINE: Preg Test, Ur: NEGATIVE

## 2017-08-28 LAB — LIPASE, BLOOD: Lipase: 28 U/L (ref 11–51)

## 2017-08-28 LAB — POC OCCULT BLOOD, ED: FECAL OCCULT BLD: NEGATIVE

## 2017-08-28 MED ORDER — SUCRALFATE 1 G PO TABS
1.0000 g | ORAL_TABLET | Freq: Three times a day (TID) | ORAL | 0 refills | Status: AC
Start: 2017-08-28 — End: ?

## 2017-08-28 MED ORDER — PANTOPRAZOLE SODIUM 20 MG PO TBEC: 40.0000 mg | DELAYED_RELEASE_TABLET | Freq: Every day | ORAL | 0 refills | Status: AC

## 2017-08-28 MED ORDER — IOPAMIDOL (ISOVUE-300) INJECTION 61%
100.0000 mL | Freq: Once | INTRAVENOUS | Status: AC | PRN
  Administered 2017-08-28: 100 mL via INTRAVENOUS

## 2017-08-28 MED ORDER — SODIUM CHLORIDE 0.9 % IV SOLN
1000.0000 mL | INTRAVENOUS | Status: DC
  Administered 2017-08-28: 1000 mL via INTRAVENOUS

## 2017-08-28 MED ORDER — SODIUM CHLORIDE 0.9 % IV BOLUS (SEPSIS)
500.0000 mL | Freq: Once | INTRAVENOUS | Status: AC
  Administered 2017-08-28: 500 mL via INTRAVENOUS

## 2017-08-28 MED ORDER — RANITIDINE HCL 150 MG PO TABS: 150.0000 mg | ORAL_TABLET | Freq: Two times a day (BID) | ORAL | 1 refills | Status: AC

## 2017-08-28 MED ORDER — FAMOTIDINE IN NACL 20-0.9 MG/50ML-% IV SOLN
20.0000 mg | Freq: Once | INTRAVENOUS | Status: AC
  Administered 2017-08-28: 20 mg via INTRAVENOUS
  Filled 2017-08-28: qty 50

## 2017-08-28 MED ORDER — PANTOPRAZOLE SODIUM 40 MG IV SOLR
40.0000 mg | Freq: Once | INTRAVENOUS | Status: AC
  Administered 2017-08-28: 40 mg via INTRAVENOUS
  Filled 2017-08-28: qty 40

## 2017-08-28 MED ORDER — POTASSIUM CHLORIDE CRYS ER 20 MEQ PO TBCR
40.0000 meq | EXTENDED_RELEASE_TABLET | Freq: Once | ORAL | Status: AC
Start: 2017-08-28 — End: 2017-08-28
  Administered 2017-08-28: 40 meq via ORAL
  Filled 2017-08-28: qty 2

## 2017-08-28 NOTE — ED Triage Notes (Signed)
Patient complaining of "heartburn" and vomiting blood since last night. Also complaining of upper abdominal pain.

## 2017-08-28 NOTE — ED Provider Notes (Signed)
[facility] EMERGENCY DEPARTMENT Provider Note   CSN: [id number] Arrival date & time: 08/28/17  0844     History   Chief Complaint Chief Complaint  Patient presents with  . Hematemesis    HPI Haley Brady is a 32 y.o. female.  Patient is a 32 year old female who presents to the emergency department with a complaint of heartburn and hematemesis.  The patient states she has had heartburn for quite some time.  She went for couple weeks without taking any medication, she has restarted the medication.  She states that she has heartburn almost daily.  Sometimes it is controlled by her medication and other times it is not.  In the last 24 to 48 hours she has been nauseated and vomiting.  She says that she has what tastes like blood in the back of her mouth and also what looks like it is blood or blood-tinged in her vomitus.  She has not had any changes in her stools.  The patient denies intake of any caustic substances.  She denies use of any drugs.  She denies use of alcohol.  She denies use of excessive amounts of aspirin related products.  She presents to the emergency department at this time because of the pain and also because of the 4 or 5 episodes of seeing or tasting blood in her vomitus.  The history is provided by the patient.    Past Medical History:  Diagnosis Date  . Abnormal pap   . Asymptomatic varicose veins   . Chronic back pain   . Chronic nausea   . DDD (degenerative disc disease)   . Depression   . Esophageal reflux   . Ingrowing nail   . Migraines   . Panic disorder without agoraphobia   . Renal disorder   . Vaginal Pap smear, abnormal     Patient Active Problem List   Diagnosis Date Noted  . OCD (obsessive compulsive disorder) 06/24/2016  . Cocaine use disorder, severe, dependence ([place]) 06/24/2016  . Opioid use disorder, severe, dependence ([place]) 06/24/2016  . Cannabis use disorder, severe, dependence ([place]) 06/24/2016  . Stimulant abuse ([place])  06/24/2016  . Depression   . Polysubstance dependence including opioid drug with daily use ([place]) 04/12/2016  . MDD (major depressive disorder), recurrent severe, without psychosis ([place]) 04/11/2016  . Dyspepsia 08/26/2014  . Chronic nausea 05/28/2011  . URINALYSIS, ABNORMAL 08/06/2006  . MRSA INFECTION 05/23/2006  . OBESITY NOS 05/23/2006  . Anxiety 05/23/2006  . MIGRAINE HEADACHE 05/23/2006  . GERD 05/23/2006  . LOW BACK PAIN, CHRONIC 05/23/2006    Past Surgical History:  Procedure Laterality Date  . BRAVO [place] STUDY  05/2011   on nexium 40mg  daily 217 episodes of reflux, Demeester score day 1: 51.9 and Day 2: 24.8.   Marland Kitchen CHOLECYSTECTOMY    . ESOPHAGOGASTRODUODENOSCOPY  06/11/2011    SMALL HIATAL HERNIA/Moderate gastritis/  BURNING CHESTPAIN: GERD, NERD, OR NON-ULCER DYSPEPSIA/ Nl esophagus  . TONSILLECTOMY       OB History    Gravida  2   Para  1   Term      Preterm      AB      Living  1     SAB      TAB      Ectopic      Multiple      Live Births               Home Medications    Prior  to Admission medications   Medication Sig Start Date End Date Taking? Authorizing Provider  Asenapine Maleate (SAPHRIS) 10 MG SUBL Place 1 tablet (10 mg total) under the tongue daily as needed. 08/11/17   Mesner, Corene Cornea, MD  buPROPion (WELLBUTRIN XL) 300 MG 24 hr tablet Take 1 tablet (300 mg total) by mouth daily. 08/11/17   Mesner, Corene Cornea, MD  cloNIDine (CATAPRES) 0.1 MG tablet Take 0.1 mg by mouth 2 (two) times daily.    [provider]  DULoxetine (CYMBALTA) 60 MG capsule Take 1 capsule (60 mg total) by mouth 2 (two) times daily. 08/11/17   Mesner, Corene Cornea, MD  gabapentin (NEURONTIN) 300 MG capsule Take 300 mg by mouth at bedtime.    [provider]  LORazepam (ATIVAN) 1 MG tablet Take 1 tablet (1 mg total) by mouth 3 (three) times daily as needed for anxiety. 08/11/17   Mesner, Corene Cornea, MD  omeprazole (PRILOSEC) 40 MG capsule Take 1 capsule (40 mg total) by  mouth 2 (two) times daily. 08/11/17   Mesner, Corene Cornea, MD    Family History Family History  Problem Relation Age of Onset  . Heart disease Mother   . Pancreatic cancer Father 75    Social History Social History   Tobacco Use  . Smoking status: Current Every Day Smoker    Packs/day: 0.50    Types: Cigarettes  . Smokeless tobacco: Never Used  . Tobacco comment: pt reports '4 cigarettes per day."  Substance Use Topics  . Alcohol use: Yes    Comment: occasionally  . Drug use: No     Allergies   Patient has no known allergies.   Review of Systems Review of Systems  Constitutional: Negative for activity change.       All ROS Neg except as noted in HPI  HENT: Negative for nosebleeds.   Eyes: Negative for photophobia and discharge.  Respiratory: Negative for cough, shortness of breath and wheezing.   Cardiovascular: Negative for chest pain and palpitations.  Gastrointestinal: Positive for vomiting. Negative for abdominal pain, blood in stool and nausea.       Heart burn Hematemesis  Genitourinary: Negative for dysuria, frequency and hematuria.  Musculoskeletal: Negative for arthralgias, back pain and neck pain.  Skin: Negative.   Neurological: Negative for dizziness, seizures and speech difficulty.  Psychiatric/Behavioral: Negative for confusion and hallucinations.     Physical Exam Updated Vital Signs BP (!) 141/90 (BP Location: Left Arm)   Pulse 78   Temp 97.8 F (36.6 C) (Oral)   Resp 18   Ht 5\' 4"  (1.626 m)   Wt 104.3 kg (230 lb)   LMP 08/04/2017   SpO2 98%   BMI 39.48 kg/m   Physical Exam  Constitutional: She is oriented to person, place, and time. She appears well-developed and well-nourished.  Non-toxic appearance.  HENT:  Head: Normocephalic.  Right Ear: Tympanic membrane and external ear normal.  Left Ear: Tympanic membrane and external ear normal.  Eyes: Pupils are equal, round, and reactive to light. EOM and lids are normal.  Neck: Normal range of  motion. Neck supple. Carotid bruit is not present.  Cardiovascular: Normal rate, regular rhythm, normal heart sounds, intact distal pulses and normal pulses.  Pulmonary/Chest: Breath sounds normal. No respiratory distress.  Abdominal: Soft. Bowel sounds are normal. There is no tenderness. There is no guarding.  Genitourinary:  Genitourinary Comments: Chaperone present during exam. No anal fissure or anal mass appreciated.  Good sphincter tone noted of the rectum.  No mass  in the rectal vault noted.  Formed stool is appreciated in the rectal vault.  Stool is negative for occult blood.  Musculoskeletal: Normal range of motion.  Capillary refill is less than 2 seconds.  Lymphadenopathy:       Head (right side): No submandibular adenopathy present.       Head (left side): No submandibular adenopathy present.    She has no cervical adenopathy.  Neurological: She is alert and oriented to person, place, and time. She has normal strength. No cranial nerve deficit or sensory deficit.  Skin: Skin is warm and dry.  Psychiatric: She has a normal mood and affect. Her speech is normal.  Nursing note and vitals reviewed.    ED Treatments / Results  Labs (all labs ordered are listed, but only abnormal results are displayed) Labs Reviewed  URINALYSIS, [place] BLOOD, ED  POC OCCULT BLOOD, ED    EKG None  Radiology No results found.  Procedures Procedures (including critical care time)  Medications Ordered in ED Medications - No data to display   Initial Impression / Assessment and Plan / ED Course  I have reviewed the triage vital signs and the nursing notes.  Pertinent labs & imaging results that were available during my care of the patient were reviewed by me and considered in my medical decision making (see chart for details).       Final Clinical Impressions(s) / ED Diagnoses MDM Vitals signs stable. Patient has frequent heartburn in spite of  over-the-counter medications.  Suspect that the patient may have some esophagitis.  Patient denies use of alcohol or drugs at this time.  Doubt esophageal varices.  Differential also includes gastritis, peptic ulcer disease, Stool is occult blood neg.  The comprehensive metabolic panel shows the potassium to be slightly low at 3.2, otherwise the comprehensive metabolic panel is within normal limits.  The anion gap is normal.  The lipase is normal at 28.  The complete blood count is well within normal limits. Urine pregnancy test is negative.  CT abdomen pelvis shows no acute findings that are noted of the abdomen or pelvis to account for the patient's symptoms.  There are multiple nonobstructive calculi noted in the collecting system of both kidneys.  I discussed the findings with the patient in terms which she understands.  Questions were answered.  I suspect that the patient has gastritis GERD.  The patient will be treated with an H2 blocker, Carafate, proton pump inhibitor.  Patient is to follow-up with gastroenterology as soon as possible.   Final diagnoses:  Acute superficial gastritis without hemorrhage  Gastroesophageal reflux disease with esophagitis    ED Discharge Orders        Ordered    sucralfate (CARAFATE) 1 g tablet  3 times daily with meals & bedtime     08/28/17 1341    pantoprazole (PROTONIX) 20 MG tablet  Daily     08/28/17 1341    ranitidine (ZANTAC) 150 MG tablet  2 times daily     08/28/17 1341       Lily Kocher, PA-C 08/29/17 2347    Pattricia Boss, MD 08/30/17 [id number]

## 2017-08-28 NOTE — Discharge Instructions (Signed)
Your lab test are negative for acute problem.  Your CT scan is negative for acute upper GI related issue.  Your vital signs are within normal limits.  I suspect that you have gastritis and GERD causing her problem.  Please use Carafate 3 times daily, use Protonix daily, and use Zantac 2 times daily.  Please call Dr. Oneida Alar today for an appointment as soon as possible.

## 2017-09-22 ENCOUNTER — Ambulatory Visit: Payer: Self-pay | Admitting: Gastroenterology

## 2018-06-10 IMAGING — CT CT RENAL STONE PROTOCOL
2 of 4 series · 15 of 46 positions shown, 17 images · non-contrast
Comparison: April 12, 2017

CLINICAL DATA: Abdominal pain and nausea

EXAM:
CT ABDOMEN AND PELVIS WITHOUT CONTRAST
TECHNIQUE: Multidetector CT imaging of the abdomen and pelvis was performed
following the standard protocol without oral or IV contrast.

[Series 2: axial st · axial · 0.89mm/px · z∈[-571,-151]mm · 12 of 100 slices shown, 14 images]
[im 8/100  soft-tissue]
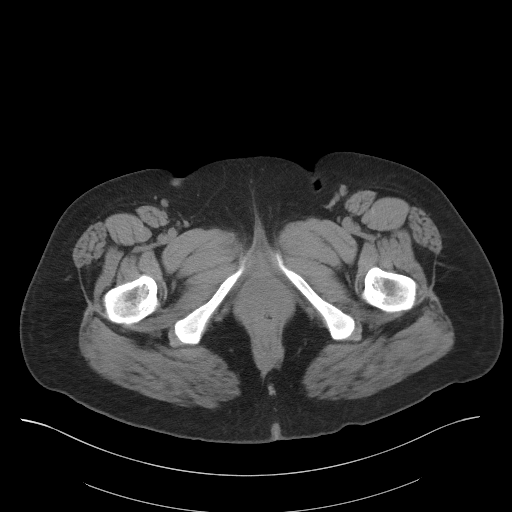
[im 8/100  bone]
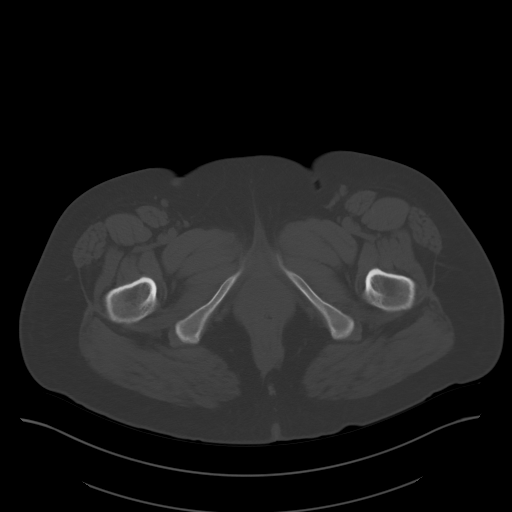
[im 16/100  soft-tissue]
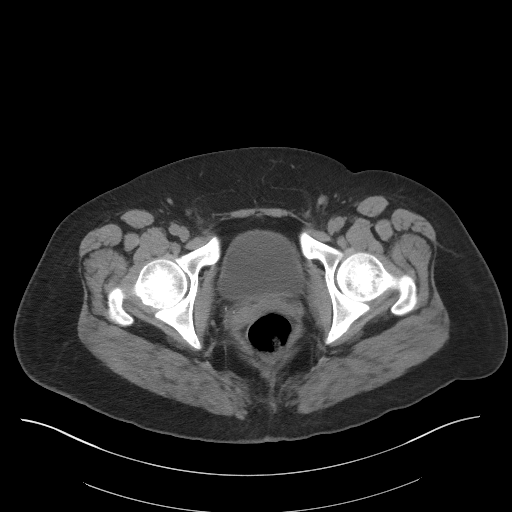
[im 23/100  soft-tissue]
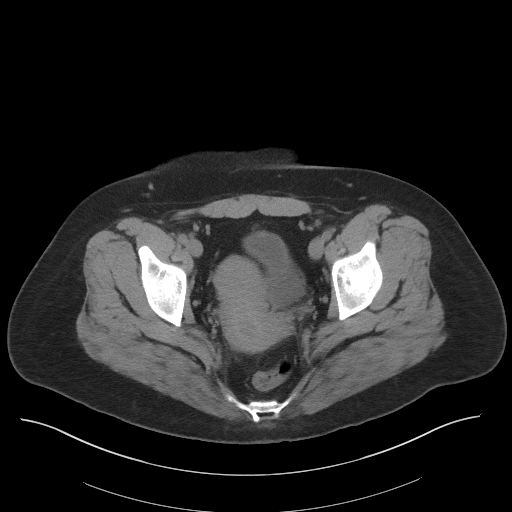
[im 31/100  soft-tissue]
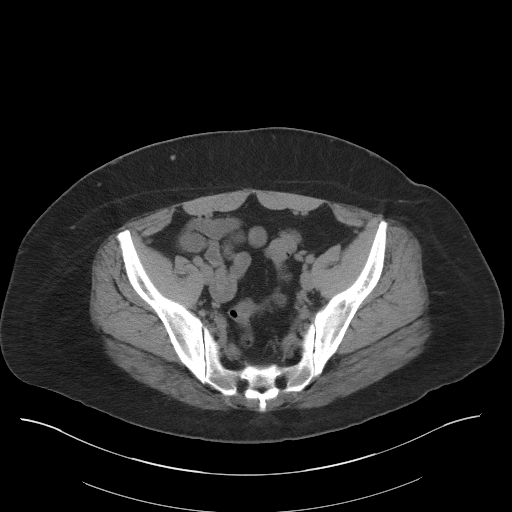
[im 39/100  soft-tissue]
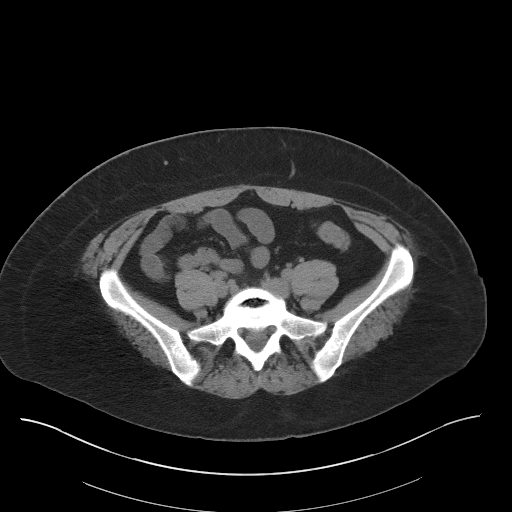
[im 46/100  soft-tissue]
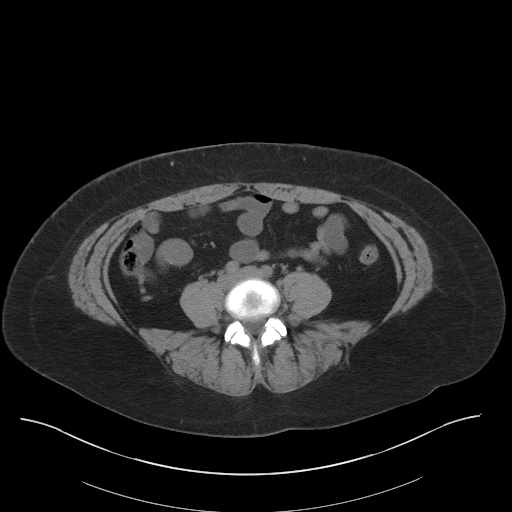
[im 54/100  soft-tissue]
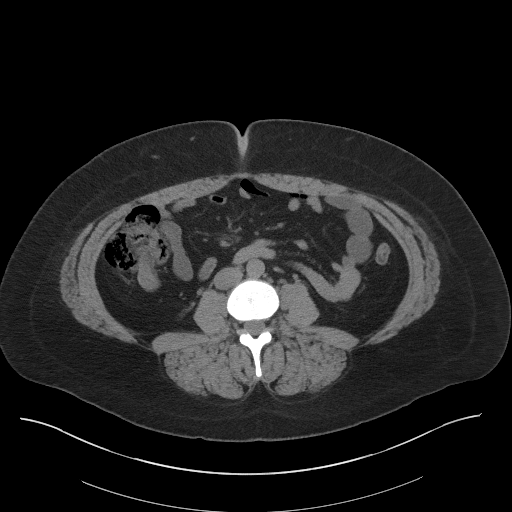
[im 61/100  soft-tissue]
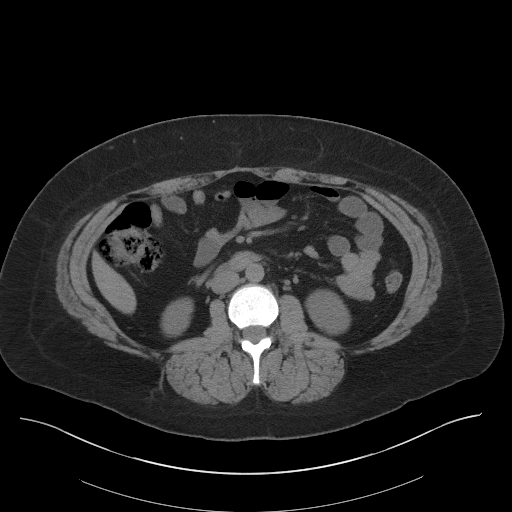
[im 69/100  soft-tissue]
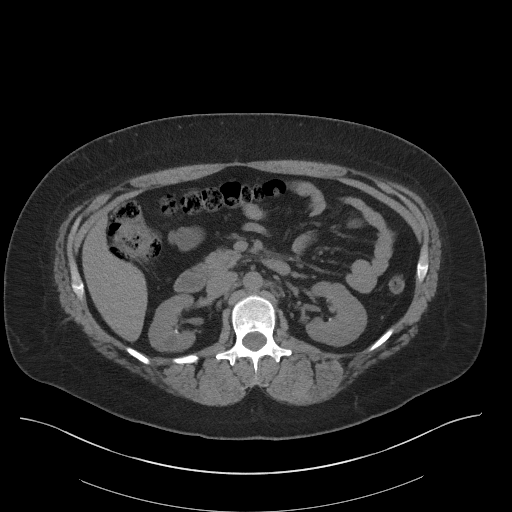
[im 69/100  bone]
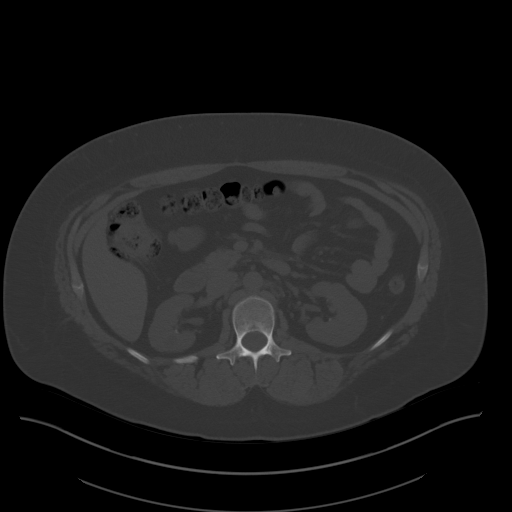
[im 77/100  soft-tissue]
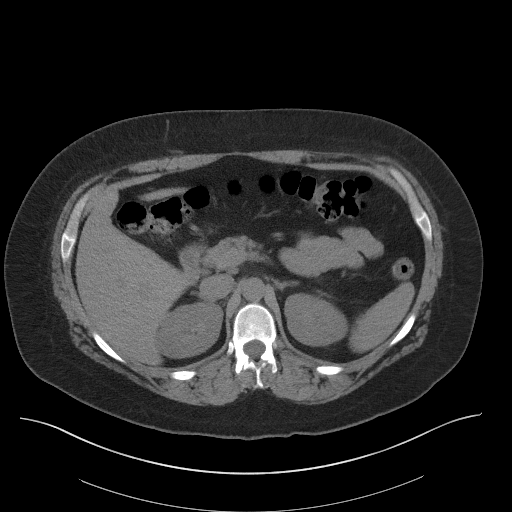
[im 84/100  soft-tissue]
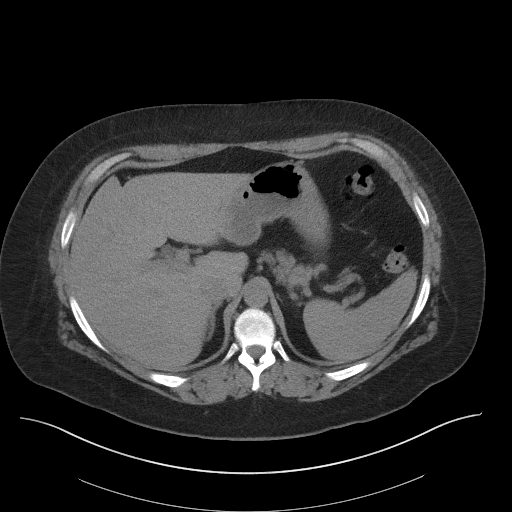
[im 92/100  soft-tissue]
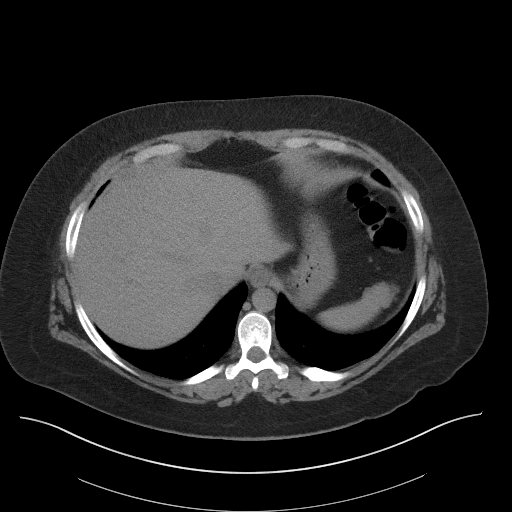

[Series 5: coronal st · coronal · 0.67mm/px · 3 of 81 slices shown]
[im 27/81  soft-tissue]
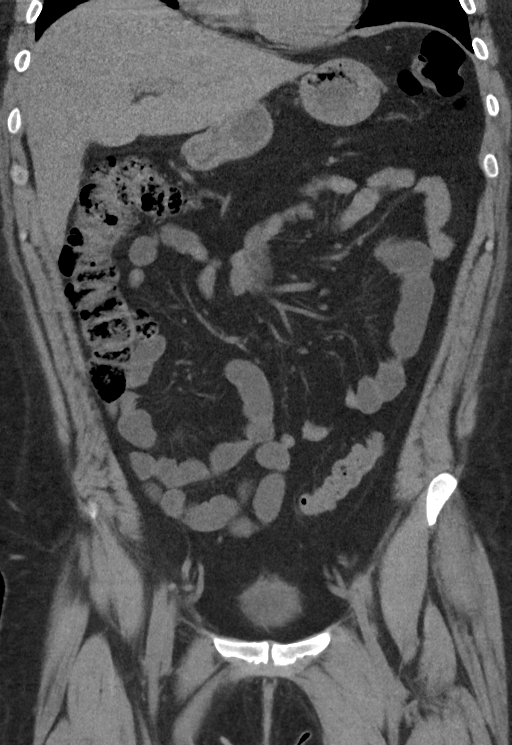
[im 36/81  soft-tissue]
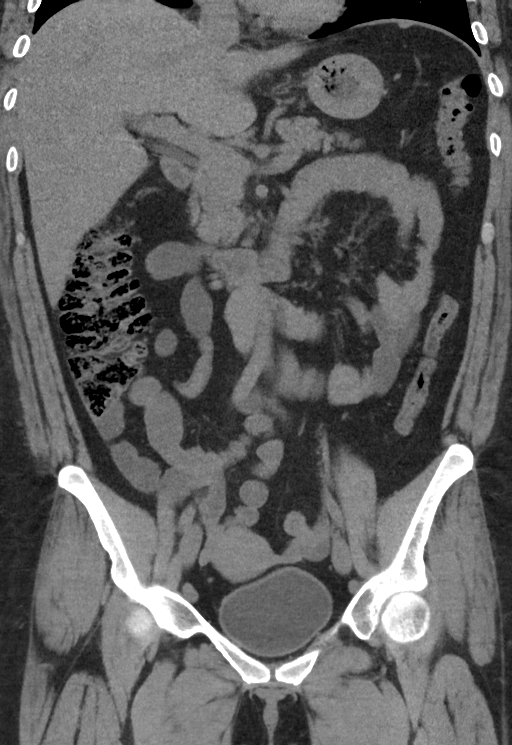
[im 45/81  soft-tissue]
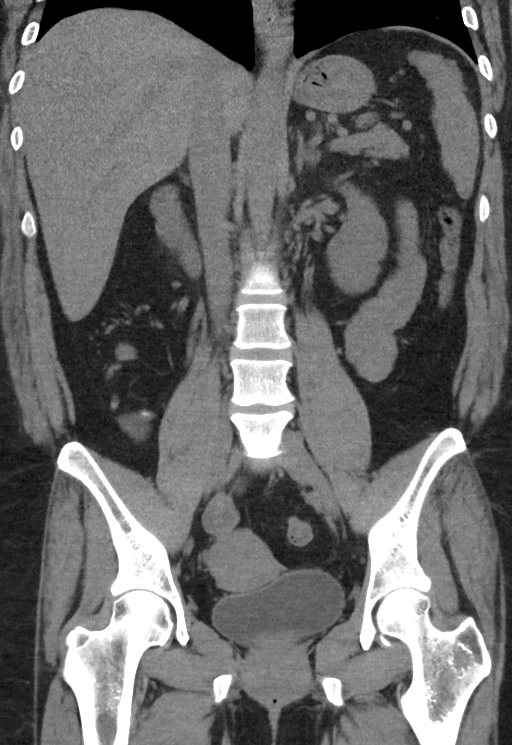

[15 of 46 positions shown; findings below may reference images not displayed]

FINDINGS: Lower chest: Lung bases are clear.

Hepatobiliary: No focal liver lesions are appreciable on this
noncontrast enhanced study. Gallbladder is absent. There is no
biliary duct dilatation.

Pancreas: No pancreatic mass or inflammatory focus.

Spleen: No splenic lesions are evident.

Adrenals/Urinary Tract: Right adrenal appears normal. There is an 8
x 7 mm left adrenal adenoma. There is a cyst in the posterior mid
right kidney measuring 1.4 x 1.2 cm. There is no hydronephrosis on
either side. There are multiple 1-2 mm calculi scattered throughout
the right kidney. There are multiple calculi throughout the left
kidney measuring between 1 and 3 mm in size. There is no appreciable
ureteral calculus on either side. Urinary bladder is midline with
wall thickness within normal limits.

Stomach/Bowel: There are occasional sigmoid diverticula without
diverticulitis. There is no appreciable bowel wall or mesenteric
thickening. No evident bowel obstruction. No free air or portal
venous air.

Vascular/Lymphatic: No abdominal aortic aneurysm. No vascular
lesions are appreciable. No evident adenopathy in the abdomen or
pelvis.

Reproductive: The uterus is anteverted.  No evident pelvic mass.

Other: Appendix appears normal. There is no ascites or abscess in
the abdomen or pelvis. There is a minimal ventral hernia containing
only fat.

Musculoskeletal: There is degenerative change at L5-S1 with vacuum
phenomenon at this level. No blastic or lytic bone lesions are
evident. No intramuscular or abdominal wall lesions are evident.
IMPRESSION: 1. Multiple small nonobstructing calculi evident in each kidney. No
ureteral calculus or hydronephrosis on either side.

2. No appreciable bowel obstruction. There are occasional sigmoid
diverticula without diverticulitis. No abscess. Appendix appears
normal.

3.  Gallbladder absent.

4.  Minimal ventral hernia containing only fat.

5.  Subcentimeter left adrenal adenoma, a benign finding.

## 2018-06-16 NOTE — Progress Notes (Signed)
REVIEWED-NO ADDITIONAL RECOMMENDATIONS. 

## 2018-08-25 IMAGING — CT CT ABD-PELV W/ CM
2 of 4 series · 16 of 46 positions shown, 18 images · IV contrast (Isovue)
Comparison: CT the abdomen and pelvis 04/12/2017.

CLINICAL DATA: 31-year-old female with complaint of heartburn and
vomiting blood since yesterday evening. Upper abdominal pain.

EXAM:
CT ABDOMEN AND PELVIS WITH CONTRAST
TECHNIQUE: Multidetector CT imaging of the abdomen and pelvis was performed
using the standard protocol following bolus administration of
intravenous contrast.
CONTRAST:  100mL ZHENXE-EWW IOPAMIDOL (ZHENXE-EWW) INJECTION 61%

[Series 2: axial st · axial · 0.76mm/px · z∈[+1018,+1473]mm · 13 of 101 slices shown, 15 images]
[im 5/101  soft-tissue]
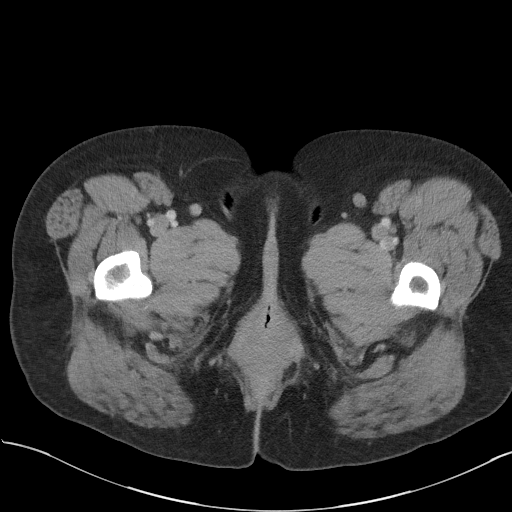
[im 5/101  bone]
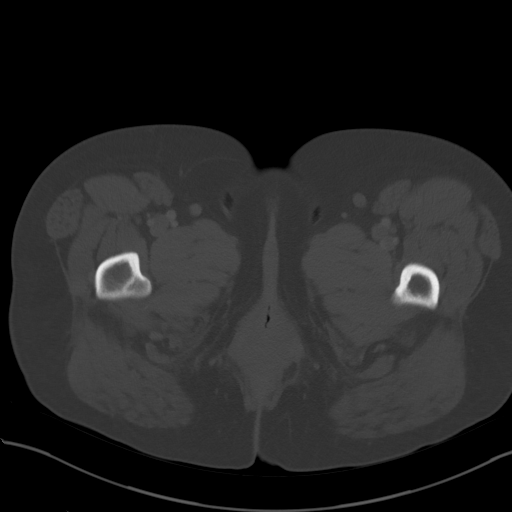
[im 13/101  soft-tissue]
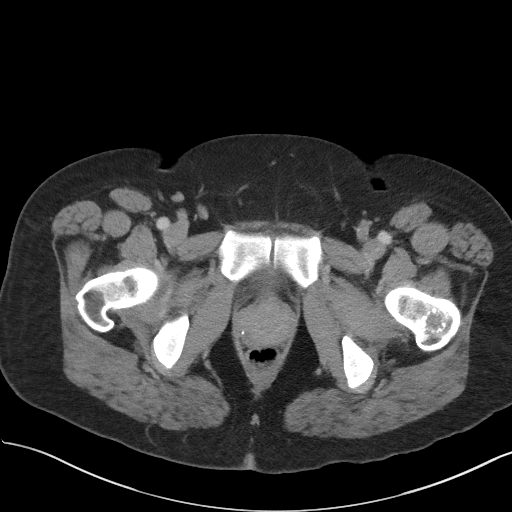
[im 21/101  soft-tissue]
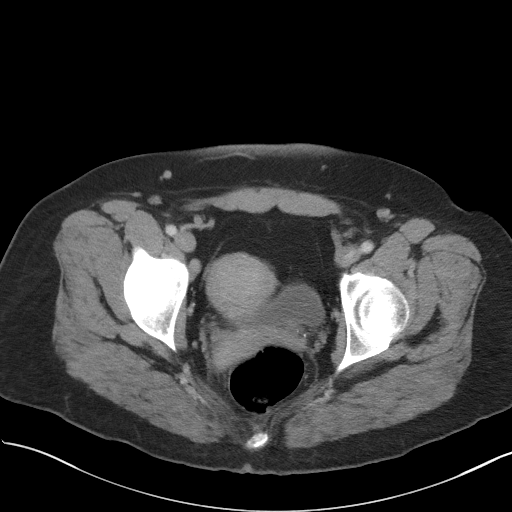
[im 30/101  soft-tissue]
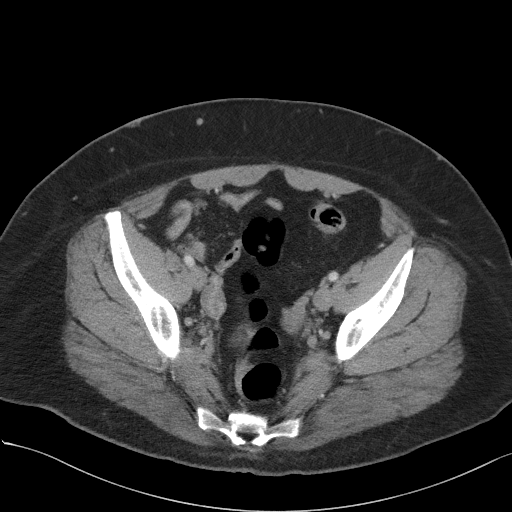
[im 34/101  soft-tissue]
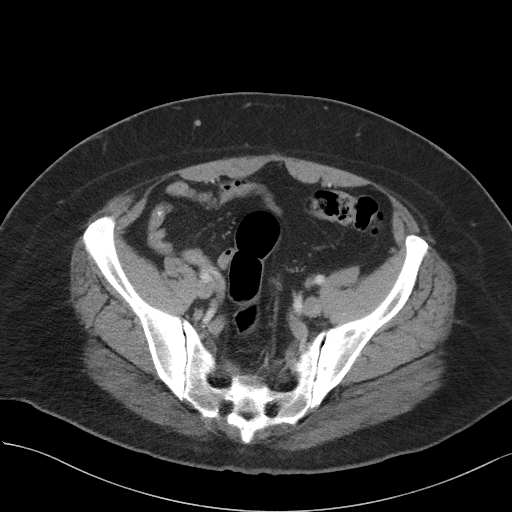
[im 42/101  soft-tissue]
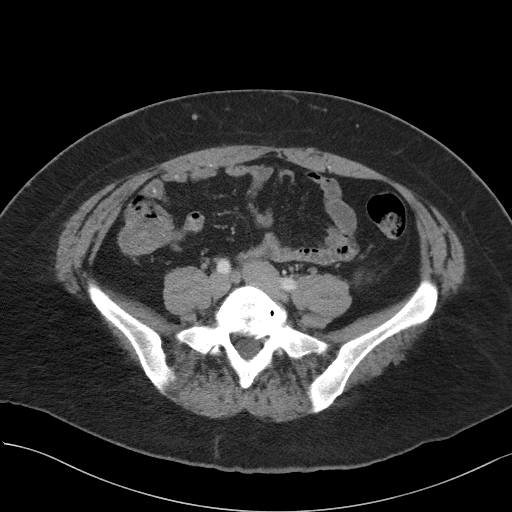
[im 51/101  soft-tissue]
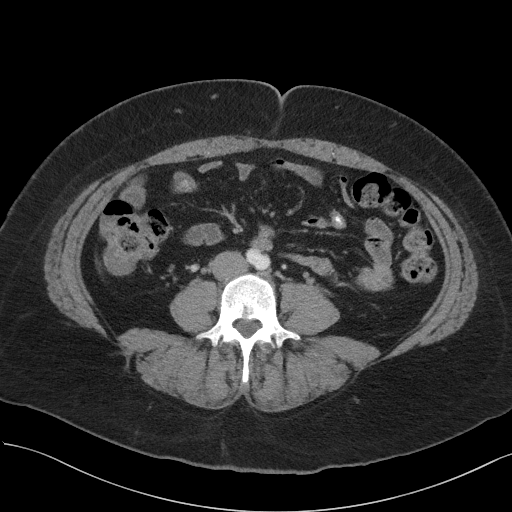
[im 59/101  soft-tissue]
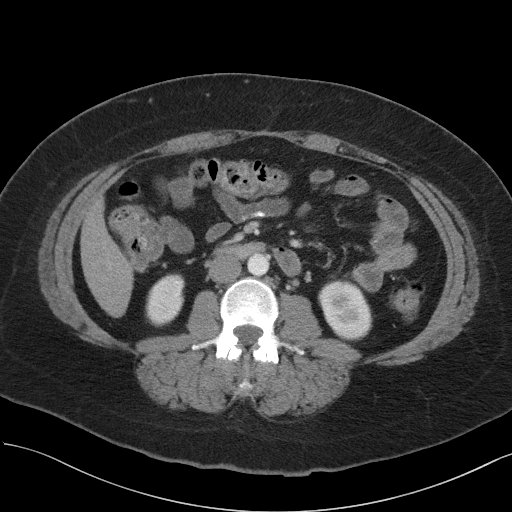
[im 67/101  soft-tissue]
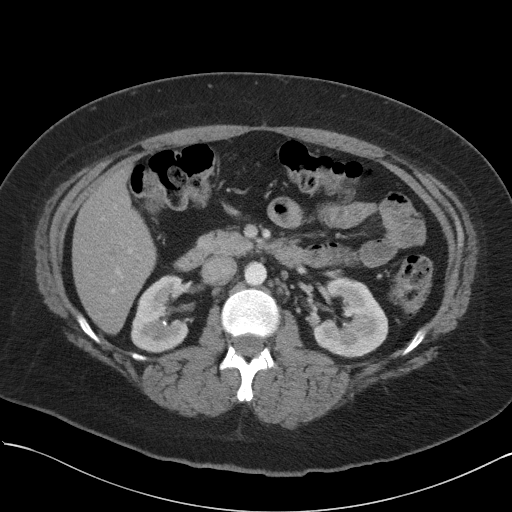
[im 67/101  bone]
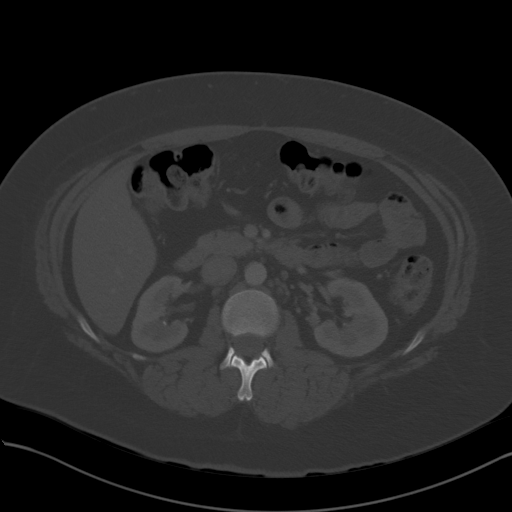
[im 71/101  soft-tissue]
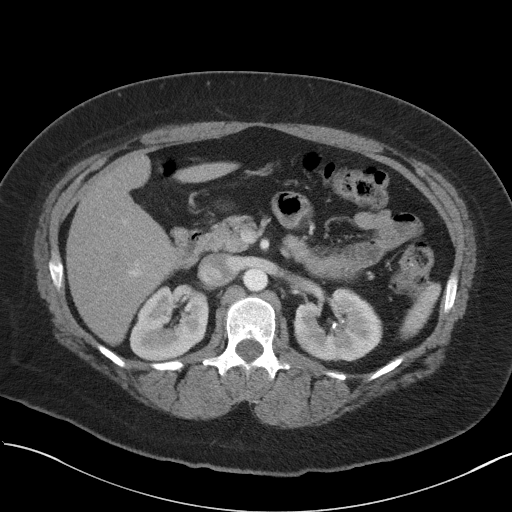
[im 80/101  soft-tissue]
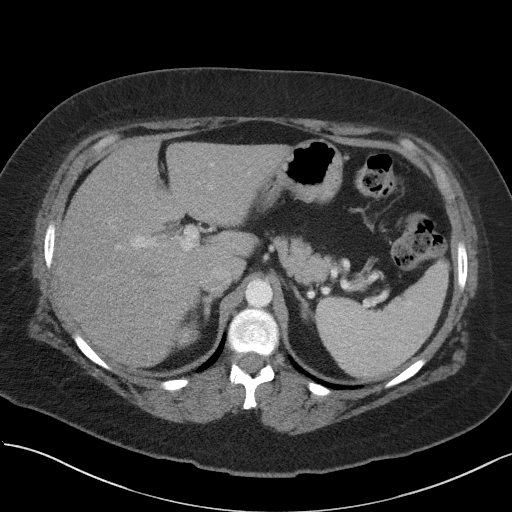
[im 88/101  soft-tissue]
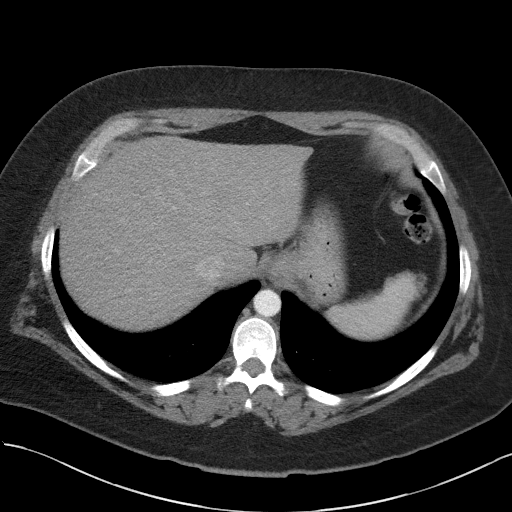
[im 96/101  soft-tissue]
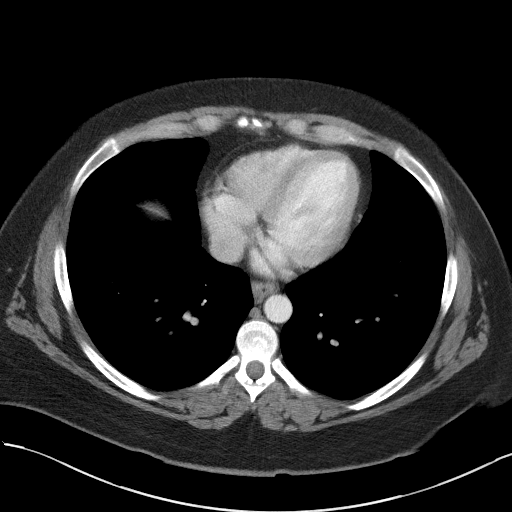

[Series 5: coronal st · coronal · 0.84mm/px · 3 of 103 slices shown]
[im 35/103  soft-tissue]
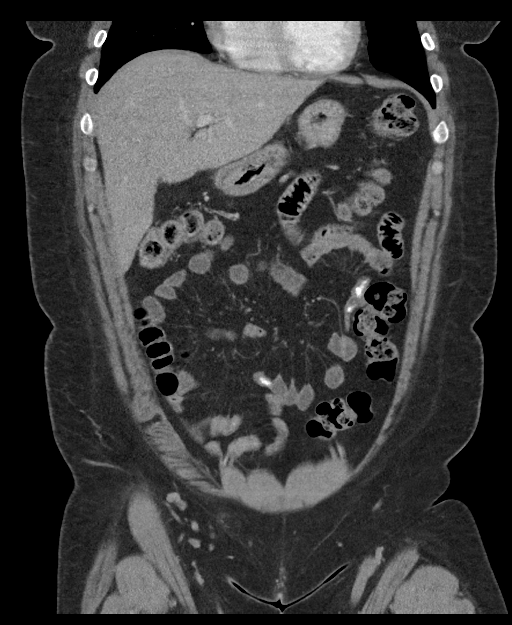
[im 46/103  soft-tissue]
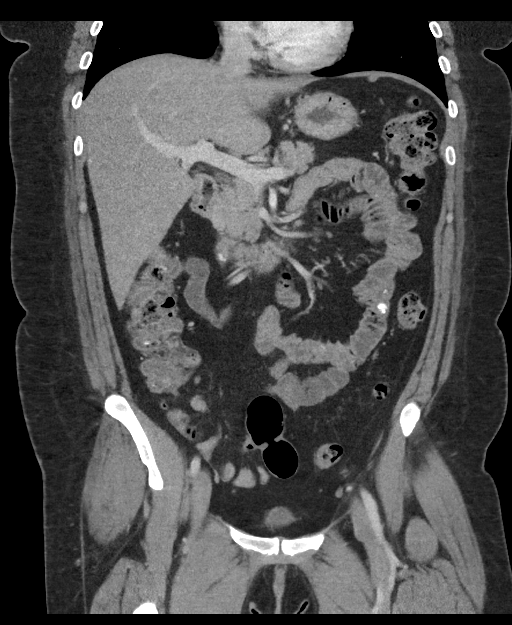
[im 57/103  soft-tissue]
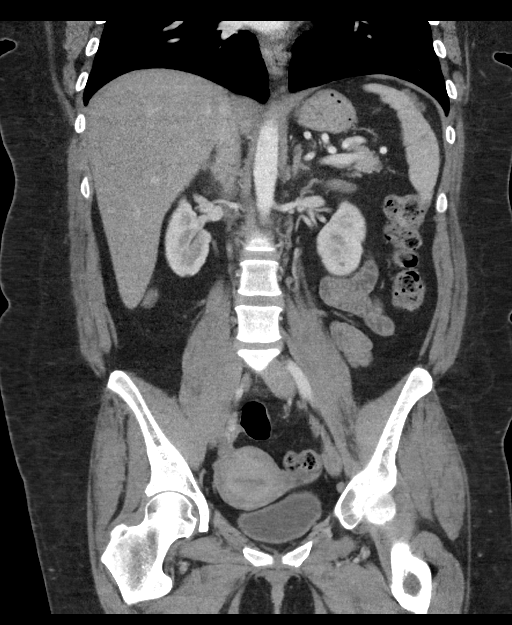

[16 of 46 positions shown; findings below may reference images not displayed]

FINDINGS: Lower chest: Unremarkable.

Hepatobiliary: No suspicious cystic or solid hepatic lesions. No
intra or extrahepatic biliary ductal dilatation. Status post
cholecystectomy.

Pancreas: No pancreatic mass. No pancreatic ductal dilatation. No
pancreatic or peripancreatic fluid or inflammatory changes.

Spleen: Innumerable small low-attenuation lesions associated with
the periphery of the spleen, most evident anteriorly, similar to the
prior examinations dating back to at least 4130, nonspecific but
presumably benign.

Adrenals/Urinary Tract: Multiple small nonobstructive calculi are
noted within the collecting systems of both kidneys measuring up to
4 mm in the upper pole of the left kidney. 1.2 cm simple cyst in the
lower pole of the right kidney. Left kidney is otherwise normal in
appearance. Bilateral adrenal glands are normal in appearance. No
hydroureteronephrosis. Urinary bladder is normal in appearance.

Stomach/Bowel: Normal appearance of the stomach. No pathologic
dilatation of small bowel or colon. Normal appendix.

Vascular/Lymphatic: No significant atherosclerotic disease, aneurysm
or dissection noted in the abdominal or pelvic vasculature. No
lymphadenopathy noted in the abdomen or pelvis.

Reproductive: Uterus and ovaries are normal in appearance.

Other: No significant volume of ascites.  No pneumoperitoneum.

Musculoskeletal: There are no aggressive appearing lytic or blastic
lesions noted in the visualized portions of the skeleton.
IMPRESSION: 1. No acute findings are noted in the abdomen or pelvis to account
for the patient's symptoms.
2. Multiple nonobstructive calculi are noted within the collecting
systems of both kidneys measuring 2-4 mm in size. No ureteral stones
or findings of urinary tract obstruction are noted at this time.

## 2020-05-01 ENCOUNTER — Other Ambulatory Visit: Payer: Self-pay | Admitting: Radiology
